# Patient Record
Sex: Female | Born: 1965 | Race: Black or African American | Hispanic: No | Smoking: Former smoker
Health system: Southern US, Community
[De-identification: ages and names within clinical notes are randomized; demographics above are authoritative.]

## PROBLEM LIST (undated history)

## (undated) DIAGNOSIS — D235 Other benign neoplasm of skin of trunk: Secondary | ICD-10-CM

## (undated) DIAGNOSIS — F32A Depression, unspecified: Secondary | ICD-10-CM

## (undated) DIAGNOSIS — I2699 Other pulmonary embolism without acute cor pulmonale: Secondary | ICD-10-CM

## (undated) DIAGNOSIS — M1990 Unspecified osteoarthritis, unspecified site: Secondary | ICD-10-CM

## (undated) DIAGNOSIS — F419 Anxiety disorder, unspecified: Secondary | ICD-10-CM

## (undated) DIAGNOSIS — M549 Dorsalgia, unspecified: Secondary | ICD-10-CM

## (undated) DIAGNOSIS — I82409 Acute embolism and thrombosis of unspecified deep veins of unspecified lower extremity: Secondary | ICD-10-CM

## (undated) DIAGNOSIS — I671 Cerebral aneurysm, nonruptured: Secondary | ICD-10-CM

## (undated) DIAGNOSIS — G8929 Other chronic pain: Secondary | ICD-10-CM

## (undated) DIAGNOSIS — D509 Iron deficiency anemia, unspecified: Secondary | ICD-10-CM

## (undated) DIAGNOSIS — I10 Essential (primary) hypertension: Secondary | ICD-10-CM

## (undated) DIAGNOSIS — M797 Fibromyalgia: Secondary | ICD-10-CM

## (undated) DIAGNOSIS — G43909 Migraine, unspecified, not intractable, without status migrainosus: Secondary | ICD-10-CM

## (undated) HISTORY — PX: ENDOMETRIAL ABLATION: SHX621

## (undated) HISTORY — DX: Iron deficiency anemia, unspecified: D50.9

## (undated) HISTORY — PX: HERNIA REPAIR: SHX51

## (undated) HISTORY — PX: COLONOSCOPY W/ POLYPECTOMY: SHX1380

---

## 2007-10-22 ENCOUNTER — Ambulatory Visit: Payer: Self-pay | Admitting: Family Medicine

## 2007-10-23 ENCOUNTER — Encounter: Admission: RE | Admit: 2007-10-23 | Discharge: 2007-10-23 | Payer: Self-pay | Admitting: Family Medicine

## 2007-11-05 ENCOUNTER — Other Ambulatory Visit: Admission: RE | Admit: 2007-11-05 | Discharge: 2007-11-05 | Payer: Self-pay | Admitting: Family Medicine

## 2007-11-05 ENCOUNTER — Ambulatory Visit: Payer: Self-pay | Admitting: Family Medicine

## 2007-11-21 ENCOUNTER — Ambulatory Visit: Payer: Self-pay | Admitting: Family Medicine

## 2007-12-06 ENCOUNTER — Ambulatory Visit: Payer: Self-pay | Admitting: Family Medicine

## 2008-03-21 ENCOUNTER — Emergency Department (HOSPITAL_COMMUNITY): Admission: EM | Admit: 2008-03-21 | Discharge: 2008-03-22 | Payer: Self-pay | Admitting: Emergency Medicine

## 2008-06-24 ENCOUNTER — Ambulatory Visit: Payer: Self-pay | Admitting: Family Medicine

## 2008-07-31 ENCOUNTER — Ambulatory Visit: Payer: Self-pay | Admitting: Family Medicine

## 2008-09-08 ENCOUNTER — Ambulatory Visit: Payer: Self-pay | Admitting: Family Medicine

## 2008-09-11 ENCOUNTER — Ambulatory Visit (HOSPITAL_COMMUNITY): Admission: RE | Admit: 2008-09-11 | Discharge: 2008-09-11 | Payer: Self-pay | Admitting: Family Medicine

## 2008-09-11 ENCOUNTER — Encounter: Payer: Self-pay | Admitting: Family Medicine

## 2008-09-11 ENCOUNTER — Ambulatory Visit: Payer: Self-pay | Admitting: Vascular Surgery

## 2008-10-01 ENCOUNTER — Ambulatory Visit: Payer: Self-pay | Admitting: Family Medicine

## 2008-10-08 ENCOUNTER — Encounter: Admission: RE | Admit: 2008-10-08 | Discharge: 2008-11-20 | Payer: Self-pay | Admitting: Orthopaedic Surgery

## 2008-10-31 ENCOUNTER — Ambulatory Visit: Payer: Self-pay | Admitting: Family Medicine

## 2008-12-05 ENCOUNTER — Ambulatory Visit: Payer: Self-pay | Admitting: Family Medicine

## 2008-12-31 ENCOUNTER — Emergency Department (HOSPITAL_COMMUNITY): Admission: EM | Admit: 2008-12-31 | Discharge: 2009-01-01 | Payer: Self-pay | Admitting: Emergency Medicine

## 2009-01-23 ENCOUNTER — Encounter (INDEPENDENT_AMBULATORY_CARE_PROVIDER_SITE_OTHER): Payer: Self-pay | Admitting: Internal Medicine

## 2009-01-23 ENCOUNTER — Inpatient Hospital Stay (HOSPITAL_COMMUNITY): Admission: EM | Admit: 2009-01-23 | Discharge: 2009-01-26 | Payer: Self-pay | Admitting: Emergency Medicine

## 2009-01-26 ENCOUNTER — Encounter (INDEPENDENT_AMBULATORY_CARE_PROVIDER_SITE_OTHER): Payer: Self-pay | Admitting: Internal Medicine

## 2009-01-28 ENCOUNTER — Ambulatory Visit: Payer: Self-pay | Admitting: Family Medicine

## 2009-01-31 ENCOUNTER — Emergency Department (HOSPITAL_COMMUNITY): Admission: EM | Admit: 2009-01-31 | Discharge: 2009-01-31 | Payer: Self-pay | Admitting: Emergency Medicine

## 2009-02-03 ENCOUNTER — Emergency Department (HOSPITAL_COMMUNITY): Admission: EM | Admit: 2009-02-03 | Discharge: 2009-02-03 | Payer: Self-pay | Admitting: Emergency Medicine

## 2009-02-04 ENCOUNTER — Ambulatory Visit: Payer: Self-pay | Admitting: Family Medicine

## 2009-02-11 ENCOUNTER — Ambulatory Visit: Payer: Self-pay | Admitting: Family Medicine

## 2009-02-25 ENCOUNTER — Emergency Department (HOSPITAL_COMMUNITY): Admission: EM | Admit: 2009-02-25 | Discharge: 2009-02-26 | Payer: Self-pay | Admitting: Emergency Medicine

## 2009-02-26 ENCOUNTER — Encounter (INDEPENDENT_AMBULATORY_CARE_PROVIDER_SITE_OTHER): Payer: Self-pay | Admitting: Emergency Medicine

## 2009-02-26 ENCOUNTER — Ambulatory Visit: Admission: RE | Admit: 2009-02-26 | Discharge: 2009-02-26 | Payer: Self-pay | Admitting: Emergency Medicine

## 2009-02-26 ENCOUNTER — Ambulatory Visit: Payer: Self-pay | Admitting: Vascular Surgery

## 2009-04-20 ENCOUNTER — Emergency Department (HOSPITAL_COMMUNITY): Admission: EM | Admit: 2009-04-20 | Discharge: 2009-04-20 | Payer: Self-pay | Admitting: Emergency Medicine

## 2009-05-09 ENCOUNTER — Emergency Department (HOSPITAL_COMMUNITY): Admission: EM | Admit: 2009-05-09 | Discharge: 2009-05-10 | Payer: Self-pay | Admitting: Emergency Medicine

## 2009-05-11 ENCOUNTER — Ambulatory Visit: Payer: Self-pay | Admitting: Family Medicine

## 2009-06-15 ENCOUNTER — Ambulatory Visit: Payer: Self-pay | Admitting: Family Medicine

## 2009-06-17 ENCOUNTER — Encounter: Admission: RE | Admit: 2009-06-17 | Discharge: 2009-06-17 | Payer: Self-pay | Admitting: Family Medicine

## 2009-06-21 ENCOUNTER — Emergency Department (HOSPITAL_COMMUNITY): Admission: EM | Admit: 2009-06-21 | Discharge: 2009-06-21 | Payer: Self-pay | Admitting: Emergency Medicine

## 2009-06-22 ENCOUNTER — Ambulatory Visit: Payer: Self-pay | Admitting: Family Medicine

## 2009-07-07 ENCOUNTER — Ambulatory Visit: Payer: Self-pay | Admitting: Family Medicine

## 2009-07-14 ENCOUNTER — Ambulatory Visit: Payer: Self-pay | Admitting: Family Medicine

## 2009-10-13 ENCOUNTER — Ambulatory Visit: Payer: Self-pay | Admitting: Family Medicine

## 2009-10-19 ENCOUNTER — Ambulatory Visit: Payer: Self-pay | Admitting: Family Medicine

## 2009-10-19 ENCOUNTER — Ambulatory Visit: Payer: Self-pay | Admitting: Oncology

## 2009-10-20 ENCOUNTER — Inpatient Hospital Stay (HOSPITAL_COMMUNITY): Admission: RE | Admit: 2009-10-20 | Discharge: 2009-10-28 | Payer: Self-pay | Admitting: Cardiovascular Disease

## 2009-10-28 ENCOUNTER — Ambulatory Visit: Payer: Self-pay | Admitting: Oncology

## 2009-10-30 LAB — PROTIME-INR
INR: 2.8 (ref 2.00–3.50)
Protime: 33.6 Seconds — ABNORMAL HIGH (ref 10.6–13.4)

## 2009-11-02 LAB — CBC WITH DIFFERENTIAL/PLATELET
BASO%: 0.3 % (ref 0.0–2.0)
Basophils Absolute: 0 10*3/uL (ref 0.0–0.1)
EOS%: 4.9 % (ref 0.0–7.0)
Eosinophils Absolute: 0.3 10*3/uL (ref 0.0–0.5)
HCT: 31.8 % — ABNORMAL LOW (ref 34.8–46.6)
HGB: 10.2 g/dL — ABNORMAL LOW (ref 11.6–15.9)
LYMPH%: 28.8 % (ref 14.0–49.7)
MCH: 25.2 pg (ref 25.1–34.0)
MCHC: 32 g/dL (ref 31.5–36.0)
MCV: 78.9 fL — ABNORMAL LOW (ref 79.5–101.0)
MONO#: 0.4 10*3/uL (ref 0.1–0.9)
MONO%: 6 % (ref 0.0–14.0)
NEUT#: 3.9 10*3/uL (ref 1.5–6.5)
NEUT%: 60 % (ref 38.4–76.8)
Platelets: 516 10*3/uL — ABNORMAL HIGH (ref 145–400)
RBC: 4.03 10*6/uL (ref 3.70–5.45)
RDW: 22.8 % — ABNORMAL HIGH (ref 11.2–14.5)
WBC: 6.5 10*3/uL (ref 3.9–10.3)
lymph#: 1.9 10*3/uL (ref 0.9–3.3)

## 2009-11-02 LAB — MORPHOLOGY: PLT EST: INCREASED

## 2009-11-02 LAB — PROTIME-INR
INR: 2.8 (ref 2.00–3.50)
Protime: 33.6 Seconds — ABNORMAL HIGH (ref 10.6–13.4)

## 2009-11-02 LAB — CHCC SMEAR

## 2009-11-03 LAB — COMPREHENSIVE METABOLIC PANEL
ALT: 12 U/L (ref 0–35)
AST: 12 U/L (ref 0–37)
Albumin: 4.1 g/dL (ref 3.5–5.2)
Alkaline Phosphatase: 73 U/L (ref 39–117)
BUN: 14 mg/dL (ref 6–23)
CO2: 19 mEq/L (ref 19–32)
Calcium: 9.2 mg/dL (ref 8.4–10.5)
Chloride: 110 mEq/L (ref 96–112)
Creatinine, Ser: 0.86 mg/dL (ref 0.40–1.20)
Glucose, Bld: 102 mg/dL — ABNORMAL HIGH (ref 70–99)
Potassium: 3.8 mEq/L (ref 3.5–5.3)
Sodium: 140 mEq/L (ref 135–145)
Total Bilirubin: 0.2 mg/dL — ABNORMAL LOW (ref 0.3–1.2)
Total Protein: 7.6 g/dL (ref 6.0–8.3)

## 2009-11-03 LAB — LACTATE DEHYDROGENASE: LDH: 183 U/L (ref 94–250)

## 2009-11-03 LAB — IRON AND TIBC
%SAT: 73 % — ABNORMAL HIGH (ref 20–55)
Iron: 269 ug/dL — ABNORMAL HIGH (ref 42–145)
TIBC: 367 ug/dL (ref 250–470)
UIBC: 98 ug/dL

## 2009-11-03 LAB — FERRITIN: Ferritin: 33 ng/mL (ref 10–291)

## 2009-11-03 LAB — TRANSFERRIN RECEPTOR, SOLUABLE: Transferrin Receptor, Soluble: 41.1 nmol/L

## 2009-11-04 LAB — PROTIME-INR
INR: 2.8 (ref 2.00–3.50)
Protime: 33.6 Seconds — ABNORMAL HIGH (ref 10.6–13.4)

## 2009-11-06 ENCOUNTER — Ambulatory Visit: Admission: RE | Admit: 2009-11-06 | Discharge: 2009-11-06 | Payer: Self-pay | Admitting: Oncology

## 2009-11-06 ENCOUNTER — Ambulatory Visit: Payer: Self-pay | Admitting: Vascular Surgery

## 2009-11-06 ENCOUNTER — Encounter (HOSPITAL_COMMUNITY): Payer: Self-pay | Admitting: Oncology

## 2009-11-06 LAB — PROTIME-INR
INR: 1.3 — ABNORMAL LOW (ref 2.00–3.50)
Protime: 15.6 Seconds — ABNORMAL HIGH (ref 10.6–13.4)

## 2009-11-09 LAB — PROTIME-INR
INR: 1.3 — ABNORMAL LOW (ref 2.00–3.50)
Protime: 15.6 Seconds — ABNORMAL HIGH (ref 10.6–13.4)

## 2009-11-11 LAB — PROTIME-INR
INR: 1.3 — ABNORMAL LOW (ref 2.00–3.50)
Protime: 15.6 Seconds — ABNORMAL HIGH (ref 10.6–13.4)

## 2009-11-13 LAB — PROTIME-INR
INR: 1.7 — ABNORMAL LOW (ref 2.00–3.50)
Protime: 20.4 Seconds — ABNORMAL HIGH (ref 10.6–13.4)

## 2009-11-17 LAB — PROTIME-INR
INR: 3.3 (ref 2.00–3.50)
Protime: 39.6 Seconds — ABNORMAL HIGH (ref 10.6–13.4)

## 2009-11-20 LAB — PROTIME-INR
INR: 2.6 (ref 2.00–3.50)
Protime: 31.2 Seconds — ABNORMAL HIGH (ref 10.6–13.4)

## 2009-11-24 ENCOUNTER — Inpatient Hospital Stay (HOSPITAL_COMMUNITY): Admission: AD | Admit: 2009-11-24 | Discharge: 2009-11-24 | Payer: Self-pay | Admitting: Obstetrics and Gynecology

## 2009-11-24 ENCOUNTER — Ambulatory Visit: Payer: Self-pay | Admitting: Nurse Practitioner

## 2009-11-27 ENCOUNTER — Ambulatory Visit: Payer: Self-pay | Admitting: Oncology

## 2009-11-27 LAB — PROTIME-INR
INR: 3.3 (ref 2.00–3.50)
Protime: 39.6 Seconds — ABNORMAL HIGH (ref 10.6–13.4)

## 2009-12-08 LAB — PROTIME-INR
INR: 4.2 — ABNORMAL HIGH (ref 2.00–3.50)
Protime: 50.4 Seconds — ABNORMAL HIGH (ref 10.6–13.4)

## 2009-12-10 LAB — PROTIME-INR
INR: 2.9 (ref 2.00–3.50)
Protime: 34.8 Seconds — ABNORMAL HIGH (ref 10.6–13.4)

## 2009-12-14 LAB — PROTIME-INR
INR: 4.6 — ABNORMAL HIGH (ref 2.00–3.50)
Protime: 55.2 Seconds — ABNORMAL HIGH (ref 10.6–13.4)

## 2009-12-16 LAB — CBC WITH DIFFERENTIAL/PLATELET
BASO%: 0.5 % (ref 0.0–2.0)
Basophils Absolute: 0 10*3/uL (ref 0.0–0.1)
EOS%: 1 % (ref 0.0–7.0)
Eosinophils Absolute: 0.1 10*3/uL (ref 0.0–0.5)
HCT: 33.4 % — ABNORMAL LOW (ref 34.8–46.6)
HGB: 11 g/dL — ABNORMAL LOW (ref 11.6–15.9)
LYMPH%: 19.7 % (ref 14.0–49.7)
MCH: 26.4 pg (ref 25.1–34.0)
MCHC: 33 g/dL (ref 31.5–36.0)
MCV: 80 fL (ref 79.5–101.0)
MONO#: 0.2 10*3/uL (ref 0.1–0.9)
MONO%: 2.7 % (ref 0.0–14.0)
NEUT#: 5.9 10*3/uL (ref 1.5–6.5)
NEUT%: 76.1 % (ref 38.4–76.8)
Platelets: 444 10*3/uL — ABNORMAL HIGH (ref 145–400)
RBC: 4.18 10*6/uL (ref 3.70–5.45)
RDW: 19.7 % — ABNORMAL HIGH (ref 11.2–14.5)
WBC: 7.8 10*3/uL (ref 3.9–10.3)
lymph#: 1.5 10*3/uL (ref 0.9–3.3)

## 2009-12-16 LAB — COMPREHENSIVE METABOLIC PANEL
ALT: 8 U/L (ref 0–35)
AST: 12 U/L (ref 0–37)
Albumin: 4.3 g/dL (ref 3.5–5.2)
Alkaline Phosphatase: 77 U/L (ref 39–117)
BUN: 15 mg/dL (ref 6–23)
CO2: 18 mEq/L — ABNORMAL LOW (ref 19–32)
Calcium: 9 mg/dL (ref 8.4–10.5)
Chloride: 109 mEq/L (ref 96–112)
Creatinine, Ser: 0.8 mg/dL (ref 0.40–1.20)
Glucose, Bld: 148 mg/dL — ABNORMAL HIGH (ref 70–99)
Potassium: 3.7 mEq/L (ref 3.5–5.3)
Sodium: 139 mEq/L (ref 135–145)
Total Bilirubin: 0.3 mg/dL (ref 0.3–1.2)
Total Protein: 7.7 g/dL (ref 6.0–8.3)

## 2009-12-16 LAB — IRON AND TIBC
%SAT: 9 % — ABNORMAL LOW (ref 20–55)
Iron: 34 ug/dL — ABNORMAL LOW (ref 42–145)
TIBC: 391 ug/dL (ref 250–470)
UIBC: 357 ug/dL

## 2009-12-16 LAB — FERRITIN: Ferritin: 8 ng/mL — ABNORMAL LOW (ref 10–291)

## 2009-12-16 LAB — PROTIME-INR
INR: 3.5 (ref 2.00–3.50)
Protime: 42 Seconds — ABNORMAL HIGH (ref 10.6–13.4)

## 2009-12-16 LAB — LACTATE DEHYDROGENASE: LDH: 153 U/L (ref 94–250)

## 2009-12-25 LAB — PROTIME-INR

## 2009-12-25 LAB — PROTHROMBIN TIME
INR: 8.67 (ref <1.50)
Prothrombin Time: 70.8 seconds — ABNORMAL HIGH (ref 11.6–15.2)

## 2009-12-28 ENCOUNTER — Ambulatory Visit: Payer: Self-pay | Admitting: Oncology

## 2009-12-28 LAB — PROTIME-INR
INR: 4.7 — ABNORMAL HIGH (ref 2.00–3.50)
Protime: 56.4 Seconds — ABNORMAL HIGH (ref 10.6–13.4)

## 2010-01-01 LAB — PROTIME-INR
INR: 1 — ABNORMAL LOW (ref 2.00–3.50)
Protime: 12 Seconds (ref 10.6–13.4)

## 2010-01-04 LAB — PROTIME-INR
INR: 1.1 — ABNORMAL LOW (ref 2.00–3.50)
Protime: 13.2 Seconds (ref 10.6–13.4)

## 2010-01-07 LAB — PROTIME-INR
INR: 1.6 — ABNORMAL LOW (ref 2.00–3.50)
Protime: 19.2 Seconds — ABNORMAL HIGH (ref 10.6–13.4)

## 2010-01-14 LAB — PROTIME-INR
INR: 2.9 (ref 2.00–3.50)
Protime: 34.8 Seconds — ABNORMAL HIGH (ref 10.6–13.4)

## 2010-01-19 LAB — PROTIME-INR
INR: 1.3 — ABNORMAL LOW (ref 2.00–3.50)
Protime: 15.6 Seconds — ABNORMAL HIGH (ref 10.6–13.4)

## 2010-01-21 LAB — PROTIME-INR
INR: 1.9 — ABNORMAL LOW (ref 2.00–3.50)
Protime: 22.8 Seconds — ABNORMAL HIGH (ref 10.6–13.4)

## 2010-01-27 LAB — CHROMOGENIC FACTOR X: CHROM XA: 40.4 % — ABNORMAL LOW (ref 86–146)

## 2010-02-04 ENCOUNTER — Emergency Department (HOSPITAL_COMMUNITY): Admission: EM | Admit: 2010-02-04 | Discharge: 2010-02-05 | Payer: Self-pay | Admitting: Emergency Medicine

## 2010-03-01 ENCOUNTER — Ambulatory Visit: Payer: Self-pay | Admitting: Oncology

## 2010-03-09 LAB — COMPREHENSIVE METABOLIC PANEL
ALT: <8 U/L (ref 0–35)
AST: 9 U/L (ref 0–37)
Albumin: 4.5 g/dL (ref 3.5–5.2)
Alkaline Phosphatase: 64 U/L (ref 39–117)
BUN: 7 mg/dL (ref 6–23)
CO2: 25 mEq/L (ref 19–32)
Calcium: 9.5 mg/dL (ref 8.4–10.5)
Chloride: 104 mEq/L (ref 96–112)
Creatinine, Ser: 0.7 mg/dL (ref 0.40–1.20)
Glucose, Bld: 97 mg/dL (ref 70–99)
Potassium: 3.8 mEq/L (ref 3.5–5.3)
Sodium: 139 mEq/L (ref 135–145)
Total Bilirubin: 0.5 mg/dL (ref 0.3–1.2)
Total Protein: 7.9 g/dL (ref 6.0–8.3)

## 2010-03-09 LAB — IRON AND TIBC
%SAT: 18 % — ABNORMAL LOW (ref 20–55)
Iron: 70 ug/dL (ref 42–145)
TIBC: 388 ug/dL (ref 250–470)
UIBC: 318 ug/dL

## 2010-03-09 LAB — CBC WITH DIFFERENTIAL/PLATELET
BASO%: 1 % (ref 0.0–2.0)
Basophils Absolute: 0.1 10*3/uL (ref 0.0–0.1)
EOS%: 0.6 % (ref 0.0–7.0)
Eosinophils Absolute: 0 10*3/uL (ref 0.0–0.5)
HCT: 36.2 % (ref 34.8–46.6)
HGB: 11.7 g/dL (ref 11.6–15.9)
LYMPH%: 31.1 % (ref 14.0–49.7)
MCH: 26.1 pg (ref 25.1–34.0)
MCHC: 32.4 g/dL (ref 31.5–36.0)
MCV: 80.4 fL (ref 79.5–101.0)
MONO#: 0.2 10*3/uL (ref 0.1–0.9)
MONO%: 3.9 % (ref 0.0–14.0)
NEUT#: 3.4 10*3/uL (ref 1.5–6.5)
NEUT%: 63.4 % (ref 38.4–76.8)
Platelets: 385 10*3/uL (ref 145–400)
RBC: 4.5 10*6/uL (ref 3.70–5.45)
RDW: 18.1 % — ABNORMAL HIGH (ref 11.2–14.5)
WBC: 5.4 10*3/uL (ref 3.9–10.3)
lymph#: 1.7 10*3/uL (ref 0.9–3.3)

## 2010-03-09 LAB — FERRITIN: Ferritin: 13 ng/mL (ref 10–291)

## 2010-03-09 LAB — LACTATE DEHYDROGENASE: LDH: 120 U/L (ref 94–250)

## 2010-03-15 LAB — CHROMOGENIC FACTOR X: CHROM XA: 99.7 % (ref 86–146)

## 2010-04-16 ENCOUNTER — Ambulatory Visit: Payer: Self-pay | Admitting: Oncology

## 2010-04-17 LAB — FERRITIN: Ferritin: 34 ng/mL (ref 10–291)

## 2010-05-10 LAB — CBC WITH DIFFERENTIAL/PLATELET
BASO%: 0.4 % (ref 0.0–2.0)
Basophils Absolute: 0 10*3/uL (ref 0.0–0.1)
EOS%: 6 % (ref 0.0–7.0)
Eosinophils Absolute: 0.3 10*3/uL (ref 0.0–0.5)
HCT: 35 % (ref 34.8–46.6)
HGB: 11.3 g/dL — ABNORMAL LOW (ref 11.6–15.9)
LYMPH%: 41.2 % (ref 14.0–49.7)
MCH: 26.6 pg (ref 25.1–34.0)
MCHC: 32.3 g/dL (ref 31.5–36.0)
MCV: 82.4 fL (ref 79.5–101.0)
MONO#: 0.4 10*3/uL (ref 0.1–0.9)
MONO%: 8.4 % (ref 0.0–14.0)
NEUT#: 2.2 10*3/uL (ref 1.5–6.5)
NEUT%: 44 % (ref 38.4–76.8)
Platelets: 267 10*3/uL (ref 145–400)
RBC: 4.25 10*6/uL (ref 3.70–5.45)
RDW: 18.4 % — ABNORMAL HIGH (ref 11.2–14.5)
WBC: 5 10*3/uL (ref 3.9–10.3)
lymph#: 2.1 10*3/uL (ref 0.9–3.3)
nRBC: 0 % (ref 0–0)

## 2010-05-10 LAB — HEPARIN ANTI-XA: Heparin LMW: 0.1 IU/mL

## 2010-05-11 LAB — COMPREHENSIVE METABOLIC PANEL
ALT: 8 U/L (ref 0–35)
AST: 12 U/L (ref 0–37)
Albumin: 3.8 g/dL (ref 3.5–5.2)
Alkaline Phosphatase: 71 U/L (ref 39–117)
BUN: 9 mg/dL (ref 6–23)
CO2: 26 mEq/L (ref 19–32)
Calcium: 8.9 mg/dL (ref 8.4–10.5)
Chloride: 108 mEq/L (ref 96–112)
Creatinine, Ser: 0.69 mg/dL (ref 0.40–1.20)
Glucose, Bld: 103 mg/dL — ABNORMAL HIGH (ref 70–99)
Potassium: 3.8 mEq/L (ref 3.5–5.3)
Sodium: 141 mEq/L (ref 135–145)
Total Bilirubin: 0.3 mg/dL (ref 0.3–1.2)
Total Protein: 6.6 g/dL (ref 6.0–8.3)

## 2010-05-11 LAB — D-DIMER, QUANTITATIVE: D-Dimer, Quant: 0.22 ug/mL-FEU (ref 0.00–0.48)

## 2010-05-11 LAB — IRON AND TIBC
%SAT: 27 % (ref 20–55)
Iron: 71 ug/dL (ref 42–145)
TIBC: 267 ug/dL (ref 250–470)
UIBC: 196 ug/dL

## 2010-05-11 LAB — FERRITIN: Ferritin: 89 ng/mL (ref 10–291)

## 2010-05-11 LAB — TRANSFERRIN RECEPTOR, SOLUABLE: Transferrin Receptor, Soluble: 13.6 nmol/L

## 2010-05-11 LAB — LACTATE DEHYDROGENASE: LDH: 124 U/L (ref 94–250)

## 2010-06-13 ENCOUNTER — Emergency Department (HOSPITAL_COMMUNITY)
Admission: EM | Admit: 2010-06-13 | Discharge: 2010-06-13 | Payer: Self-pay | Source: Home / Self Care | Admitting: Emergency Medicine

## 2010-07-08 ENCOUNTER — Emergency Department (HOSPITAL_COMMUNITY)
Admission: EM | Admit: 2010-07-08 | Discharge: 2010-07-08 | Payer: Self-pay | Source: Home / Self Care | Admitting: Emergency Medicine

## 2010-07-09 ENCOUNTER — Ambulatory Visit: Payer: Self-pay | Admitting: Oncology

## 2010-07-09 ENCOUNTER — Ambulatory Visit
Admission: RE | Admit: 2010-07-09 | Discharge: 2010-07-09 | Payer: Self-pay | Source: Home / Self Care | Attending: Oncology | Admitting: Oncology

## 2010-07-11 ENCOUNTER — Encounter: Payer: Self-pay | Admitting: Family Medicine

## 2010-07-12 LAB — CBC
HCT: 36.8 % (ref 36.0–46.0)
Hemoglobin: 11.6 g/dL — ABNORMAL LOW (ref 12.0–15.0)
MCH: 26.7 pg (ref 26.0–34.0)
MCHC: 31.5 g/dL (ref 30.0–36.0)
MCV: 84.6 fL (ref 78.0–100.0)
Platelets: 288 10*3/uL (ref 150–400)
RBC: 4.35 MIL/uL (ref 3.87–5.11)
RDW: 14.6 % (ref 11.5–15.5)
WBC: 8.1 10*3/uL (ref 4.0–10.5)

## 2010-07-12 LAB — DIFFERENTIAL
Basophils Absolute: 0 10*3/uL (ref 0.0–0.1)
Basophils Relative: 0 % (ref 0–1)
Eosinophils Absolute: 0.3 10*3/uL (ref 0.0–0.7)
Eosinophils Relative: 4 % (ref 0–5)
Lymphocytes Relative: 38 % (ref 12–46)
Lymphs Abs: 3.1 10*3/uL (ref 0.7–4.0)
Monocytes Absolute: 0.5 10*3/uL (ref 0.1–1.0)
Monocytes Relative: 6 % (ref 3–12)
Neutro Abs: 4.2 10*3/uL (ref 1.7–7.7)
Neutrophils Relative %: 52 % (ref 43–77)

## 2010-07-12 LAB — BASIC METABOLIC PANEL
BUN: 4 mg/dL — ABNORMAL LOW (ref 6–23)
CO2: 27 mEq/L (ref 19–32)
Calcium: 8.9 mg/dL (ref 8.4–10.5)
Chloride: 105 mEq/L (ref 96–112)
Creatinine, Ser: 0.69 mg/dL (ref 0.4–1.2)
GFR calc Af Amer: >60 mL/min (ref >60)
GFR calc non Af Amer: >60 mL/min (ref >60)
Glucose, Bld: 98 mg/dL (ref 70–99)
Potassium: 3.5 mEq/L (ref 3.5–5.1)
Sodium: 140 mEq/L (ref 135–145)

## 2010-07-13 LAB — CBC WITH DIFFERENTIAL/PLATELET
BASO%: 0.3 % (ref 0.0–2.0)
Basophils Absolute: 0 10*3/uL (ref 0.0–0.1)
EOS%: 3.6 % (ref 0.0–7.0)
Eosinophils Absolute: 0.3 10*3/uL (ref 0.0–0.5)
HCT: 40.2 % (ref 34.8–46.6)
HGB: 13.1 g/dL (ref 11.6–15.9)
LYMPH%: 25.6 % (ref 14.0–49.7)
MCH: 27.6 pg (ref 25.1–34.0)
MCHC: 32.6 g/dL (ref 31.5–36.0)
MCV: 84.6 fL (ref 79.5–101.0)
MONO#: 0.2 10*3/uL (ref 0.1–0.9)
MONO%: 2.1 % (ref 0.0–14.0)
NEUT#: 5.2 10*3/uL (ref 1.5–6.5)
NEUT%: 68.4 % (ref 38.4–76.8)
Platelets: 319 10*3/uL (ref 145–400)
RBC: 4.75 10*6/uL (ref 3.70–5.45)
RDW: 14.4 % (ref 11.2–14.5)
WBC: 7.6 10*3/uL (ref 3.9–10.3)
lymph#: 1.9 10*3/uL (ref 0.9–3.3)

## 2010-07-13 LAB — HEPARIN ANTI-XA: Heparin LMW: 0.39 IU/mL

## 2010-07-15 LAB — COMPREHENSIVE METABOLIC PANEL
ALT: 20 U/L (ref 0–35)
AST: 26 U/L (ref 0–37)
Albumin: 4.1 g/dL (ref 3.5–5.2)
Alkaline Phosphatase: 89 U/L (ref 39–117)
BUN: 7 mg/dL (ref 6–23)
CO2: 21 mEq/L (ref 19–32)
Calcium: 9.6 mg/dL (ref 8.4–10.5)
Chloride: 106 mEq/L (ref 96–112)
Creatinine, Ser: 0.81 mg/dL (ref 0.40–1.20)
Glucose, Bld: 95 mg/dL (ref 70–99)
Potassium: 4.3 mEq/L (ref 3.5–5.3)
Sodium: 139 mEq/L (ref 135–145)
Total Bilirubin: 0.3 mg/dL (ref 0.3–1.2)
Total Protein: 7.5 g/dL (ref 6.0–8.3)

## 2010-07-15 LAB — TRANSFERRIN RECEPTOR, SOLUABLE: Transferrin Receptor, Soluble: 12.5 nmol/L

## 2010-07-15 LAB — IRON AND TIBC
%SAT: 24 % (ref 20–55)
Iron: 67 ug/dL (ref 42–145)
TIBC: 284 ug/dL (ref 250–470)
UIBC: 217 ug/dL

## 2010-07-15 LAB — LACTATE DEHYDROGENASE: LDH: 211 U/L (ref 94–250)

## 2010-07-15 LAB — FERRITIN: Ferritin: 46 ng/mL (ref 10–291)

## 2010-07-16 ENCOUNTER — Encounter
Admission: RE | Admit: 2010-07-16 | Discharge: 2010-07-16 | Payer: Self-pay | Source: Home / Self Care | Attending: Oncology | Admitting: Oncology

## 2010-07-19 LAB — HEPARIN ANTI-XA: Heparin LMW: 0.86 IU/mL

## 2010-07-30 ENCOUNTER — Encounter (HOSPITAL_COMMUNITY)
Admission: RE | Admit: 2010-07-30 | Discharge: 2010-07-30 | Disposition: A | Discharge status: Home / Self Care | Payer: Medicaid Other | Source: Ambulatory Visit | Attending: Obstetrics and Gynecology | Admitting: Obstetrics and Gynecology

## 2010-07-30 DIAGNOSIS — Z01812 Encounter for preprocedural laboratory examination: Secondary | ICD-10-CM | POA: Insufficient documentation

## 2010-07-30 LAB — BASIC METABOLIC PANEL
BUN: 9 mg/dL (ref 6–23)
CO2: 26 mEq/L (ref 19–32)
Calcium: 9.2 mg/dL (ref 8.4–10.5)
Chloride: 104 mEq/L (ref 96–112)
Creatinine, Ser: 0.81 mg/dL (ref 0.4–1.2)
GFR calc Af Amer: >60 mL/min (ref >60)
GFR calc non Af Amer: >60 mL/min (ref >60)
Glucose, Bld: 92 mg/dL (ref 70–99)
Potassium: 3.2 mEq/L — ABNORMAL LOW (ref 3.5–5.1)
Sodium: 138 mEq/L (ref 135–145)

## 2010-07-30 LAB — CBC
HCT: 41.2 % (ref 36.0–46.0)
Hemoglobin: 13.4 g/dL (ref 12.0–15.0)
MCH: 27.5 pg (ref 26.0–34.0)
MCHC: 32.5 g/dL (ref 30.0–36.0)
MCV: 84.6 fL (ref 78.0–100.0)
Platelets: 329 10*3/uL (ref 150–400)
RBC: 4.87 MIL/uL (ref 3.87–5.11)
RDW: 14.1 % (ref 11.5–15.5)
WBC: 5.1 10*3/uL (ref 4.0–10.5)

## 2010-08-05 ENCOUNTER — Ambulatory Visit (HOSPITAL_COMMUNITY)
Admission: RE | Admit: 2010-08-05 | Discharge: 2010-08-05 | Disposition: A | Discharge status: Home / Self Care | Payer: Medicaid Other | Source: Ambulatory Visit | Attending: Obstetrics and Gynecology | Admitting: Obstetrics and Gynecology

## 2010-08-05 ENCOUNTER — Other Ambulatory Visit: Payer: Self-pay | Admitting: Obstetrics and Gynecology

## 2010-08-05 DIAGNOSIS — D649 Anemia, unspecified: Secondary | ICD-10-CM | POA: Insufficient documentation

## 2010-08-05 DIAGNOSIS — Z7901 Long term (current) use of anticoagulants: Secondary | ICD-10-CM | POA: Insufficient documentation

## 2010-08-05 DIAGNOSIS — N920 Excessive and frequent menstruation with regular cycle: Secondary | ICD-10-CM | POA: Insufficient documentation

## 2010-08-05 DIAGNOSIS — Z30432 Encounter for removal of intrauterine contraceptive device: Secondary | ICD-10-CM | POA: Insufficient documentation

## 2010-08-09 LAB — TYPE AND SCREEN
ABO/RH(D): O POS
Antibody Screen: POSITIVE
DAT, IgG: NEGATIVE

## 2010-08-09 NOTE — Op Note (Signed)
Maria [name], [name]               ACCOUNT NO.:  [id number]  MEDICAL RECORD NO.:  [id number]           PATIENT TYPE:  O  LOCATION:  [facility]                          [facility]  PHYSICIAN:  Tonatiuh Mallon H. Tenny Craw, MD     DATE OF BIRTH:  09/26/1965  DATE OF PROCEDURE:  08/05/2010 DATE OF DISCHARGE:  08/05/2010                              OPERATIVE REPORT   PREOPERATIVE DIAGNOSES: 1. Anemia. 2. Menorrhagia. 3. Chronic anticoagulation.  POSTOPERATIVE DIAGNOSES: 1. Anemia. 2. Menorrhagia. 3. Chronic anticoagulation.  PROCEDURES:  Mirena intrauterine device removal, hysteroscopy, dilation and curettage and endometrial ablation with NovaSure.  SURGEON:  Freddrick March. Tenny Craw, MD  ASSISTANT:  None.  ANESTHESIA:  General.  OPERATIVE FINDINGS:  Small uterus.  No palpable masses.  The uterus sounded to 9-cm, cervical length 4-1/2 cm, cavity length 4.5 cm, cavity width 3.5 cm, power 8.7, ablation time 1 minute 40 seconds.  HYSTEROSCOPIC FINDINGS:  Were atrophic endometrium with bilateral tubal ostia visualized.  SPECIMENS:  Endometrial curettings.  DISPOSITION OF SPECIMENS:  To Pathology.  ESTIMATED BLOOD LOSS:  Minimal.  COMPLICATIONS:  None.  PROCEDURE:  Maria [name] is a 45 year old, G5, P5, African American female with a history of anemia and menorrhagia.  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  With initiation of anticoagulation, her menorrhagia worsened.  She had a trial of Mirena intrauterine device which did decrease the volume of bleeding.  However, she still had persistent bleeding.  We then tried a trial of Depo-Lupron.  She continued to have spotting.  Given these findings and the desire to avoid hysterectomy if possible due to the fact that the patient is a poor surgical candidate for hysterectomy due to her history of prior small bowel resection at the time of her second C-section due to adhesions and bowel perforation.  The  decision was made to proceed with an endometrial ablation to try to minimize bleeding. Risks, benefits and alternatives of the procedure were discussed with the patient at length. Consultation with the patient's hematologist, Dr. Arline Asp were undertaken to coordinate anticoagulation.  The patient was then brought to the operating room, where she was placed in the dorsal lithotomy position and general anesthesia was administered and found to be adequate.  The vagina and perineum were prepped and draped in the normal sterile fashion.  Speculum was placed into the vagina.  Single- toothed tenaculum was placed on the anterior lip of the cervix.  The Mirena intrauterine device was removed with ring forceps.  The cervix was serially dilated with Hanks dilators.  The hysteroscope was then introduced transcervically with good visualization of the endometrial cavity.  No significant polyps or fibroids were noted.  Tubal ostia were visualized bilaterally.  The endometrium itself appeared atrophic. Hysteroscope was removed and a sharp curettage was performed for minimal tissue.  The uterine cavity was sounded for a length of 9 cm.  The cervical length was then obtained and found to be 4-1/2 cm.  The NovaSure device was then deployed easily.  Cavity width was performed for 3.5 cm.  The cavity assessment was performed and  passed.  The ablative procedure was then initiated for 1 minute and 40 seconds of ablation at a power of 8.7.  After the ablate of procedure was completed, the NovaSure device was removed from the uterus.  The hysteroscope was reintroduced transcervically and demonstrated successful ablation of all visualized endometrium.  The hysteroscope was removed from the uterus.  The single-toothed tenaculum was removed from the anterior lip of the cervix.  Minimal bleeding was noted at the end of the procedure.  The speculum was then removed.  This completed the procedure.  The patient was  taken out of lithotomy position and the anesthesia was reversed and the patient was brought transferred to the PACU in stable condition.     Freddrick March. Tenny Craw, MD     KHR/MEDQ  D:  08/05/2010  T:  08/06/2010  Job:  [id number]  cc:   Samul Dada, M.D. Fax: [phone]  Electronically Signed by Waynard Reeds MD on 08/09/2010 09:29:06 AM

## 2010-08-16 ENCOUNTER — Other Ambulatory Visit (HOSPITAL_COMMUNITY): Payer: Self-pay | Admitting: Oncology

## 2010-08-16 ENCOUNTER — Encounter (HOSPITAL_BASED_OUTPATIENT_CLINIC_OR_DEPARTMENT_OTHER): Payer: Medicaid Other | Admitting: Oncology

## 2010-08-16 DIAGNOSIS — N898 Other specified noninflammatory disorders of vagina: Secondary | ICD-10-CM

## 2010-08-16 DIAGNOSIS — D509 Iron deficiency anemia, unspecified: Secondary | ICD-10-CM

## 2010-08-16 DIAGNOSIS — I2699 Other pulmonary embolism without acute cor pulmonale: Secondary | ICD-10-CM

## 2010-08-16 DIAGNOSIS — Z86718 Personal history of other venous thrombosis and embolism: Secondary | ICD-10-CM

## 2010-08-16 LAB — CBC WITH DIFFERENTIAL/PLATELET
BASO%: 0.5 % (ref 0.0–2.0)
Basophils Absolute: 0 10*3/uL (ref 0.0–0.1)
EOS%: 3.2 % (ref 0.0–7.0)
Eosinophils Absolute: 0.2 10*3/uL (ref 0.0–0.5)
HCT: 38 % (ref 34.8–46.6)
HGB: 12.5 g/dL (ref 11.6–15.9)
LYMPH%: 35.7 % (ref 14.0–49.7)
MCH: 27.4 pg (ref 25.1–34.0)
MCHC: 32.9 g/dL (ref 31.5–36.0)
MCV: 83.3 fL (ref 79.5–101.0)
MONO#: 0.2 10*3/uL (ref 0.1–0.9)
MONO%: 4 % (ref 0.0–14.0)
NEUT#: 3.2 10*3/uL (ref 1.5–6.5)
NEUT%: 56.6 % (ref 38.4–76.8)
Platelets: 283 10*3/uL (ref 145–400)
RBC: 4.56 10*6/uL (ref 3.70–5.45)
RDW: 14.6 % — ABNORMAL HIGH (ref 11.2–14.5)
WBC: 5.7 10*3/uL (ref 3.9–10.3)
lymph#: 2 10*3/uL (ref 0.9–3.3)
nRBC: 0 % (ref 0–0)

## 2010-08-16 LAB — HEPARIN ANTI-XA: Heparin LMW: 1.17 IU/mL

## 2010-08-18 ENCOUNTER — Ambulatory Visit (HOSPITAL_COMMUNITY)
Admission: RE | Admit: 2010-08-18 | Discharge: 2010-08-18 | Disposition: A | Discharge status: Home / Self Care | Payer: Medicaid Other | Source: Ambulatory Visit | Attending: Oncology | Admitting: Oncology

## 2010-08-18 DIAGNOSIS — M79609 Pain in unspecified limb: Secondary | ICD-10-CM

## 2010-08-18 DIAGNOSIS — I2782 Chronic pulmonary embolism: Secondary | ICD-10-CM | POA: Insufficient documentation

## 2010-08-18 DIAGNOSIS — Z7901 Long term (current) use of anticoagulants: Secondary | ICD-10-CM | POA: Insufficient documentation

## 2010-08-23 ENCOUNTER — Other Ambulatory Visit: Payer: Self-pay | Admitting: Obstetrics and Gynecology

## 2010-09-03 LAB — URINALYSIS, ROUTINE W REFLEX MICROSCOPIC
Bilirubin Urine: NEGATIVE
Glucose, UA: NEGATIVE mg/dL
Ketones, ur: >80 mg/dL — AB
Nitrite: NEGATIVE
Protein, ur: 30 mg/dL — AB
Specific Gravity, Urine: >1.046 — ABNORMAL HIGH (ref 1.005–1.030)
Urobilinogen, UA: 0.2 mg/dL (ref 0.0–1.0)
pH: 6.5 (ref 5.0–8.0)

## 2010-09-03 LAB — CULTURE, BLOOD (ROUTINE X 2)
Culture: NO GROWTH
Culture: NO GROWTH

## 2010-09-03 LAB — POTASSIUM: Potassium: 3.6 mEq/L (ref 3.5–5.1)

## 2010-09-03 LAB — LIPASE, BLOOD: Lipase: 35 U/L (ref 11–59)

## 2010-09-03 LAB — PROCALCITONIN: Procalcitonin: <0.5 ng/mL

## 2010-09-03 LAB — PREGNANCY, URINE: Preg Test, Ur: NEGATIVE

## 2010-09-03 LAB — POCT I-STAT, CHEM 8
BUN: 10 mg/dL (ref 6–23)
Calcium, Ion: 1.01 mmol/L — ABNORMAL LOW (ref 1.12–1.32)
Chloride: 107 mEq/L (ref 96–112)
Creatinine, Ser: 0.8 mg/dL (ref 0.4–1.2)
Glucose, Bld: 104 mg/dL — ABNORMAL HIGH (ref 70–99)
HCT: 41 % (ref 36.0–46.0)
Hemoglobin: 13.9 g/dL (ref 12.0–15.0)
Potassium: 6.7 mEq/L (ref 3.5–5.1)
Sodium: 139 mEq/L (ref 135–145)
TCO2: 28 mmol/L (ref 0–100)

## 2010-09-03 LAB — DIFFERENTIAL
Basophils Absolute: 0 10*3/uL (ref 0.0–0.1)
Basophils Relative: 0 % (ref 0–1)
Eosinophils Absolute: 0 10*3/uL (ref 0.0–0.7)
Eosinophils Relative: 0 % (ref 0–5)
Lymphocytes Relative: 19 % (ref 12–46)
Lymphs Abs: 1.7 10*3/uL (ref 0.7–4.0)
Monocytes Absolute: 0.4 10*3/uL (ref 0.1–1.0)
Monocytes Relative: 4 % (ref 3–12)
Neutro Abs: 7.1 10*3/uL (ref 1.7–7.7)
Neutrophils Relative %: 77 % (ref 43–77)

## 2010-09-03 LAB — CBC
HCT: 37.5 % (ref 36.0–46.0)
Hemoglobin: 12.2 g/dL (ref 12.0–15.0)
MCH: 26.6 pg (ref 26.0–34.0)
MCHC: 32.5 g/dL (ref 30.0–36.0)
MCV: 81.8 fL (ref 78.0–100.0)
Platelets: 397 10*3/uL (ref 150–400)
RBC: 4.59 MIL/uL (ref 3.87–5.11)
RDW: 17.8 % — ABNORMAL HIGH (ref 11.5–15.5)
WBC: 9.2 10*3/uL (ref 4.0–10.5)

## 2010-09-03 LAB — URINE CULTURE
Colony Count: 75000
Culture  Setup Time: 201108190529

## 2010-09-03 LAB — HEPATIC FUNCTION PANEL
ALT: 10 U/L (ref 0–35)
AST: 16 U/L (ref 0–37)
Albumin: 4.2 g/dL (ref 3.5–5.2)
Alkaline Phosphatase: 57 U/L (ref 39–117)
Bilirubin, Direct: 0.1 mg/dL (ref 0.0–0.3)
Indirect Bilirubin: 0.7 mg/dL (ref 0.3–0.9)
Total Bilirubin: 0.8 mg/dL (ref 0.3–1.2)
Total Protein: 8 g/dL (ref 6.0–8.3)

## 2010-09-03 LAB — URINE MICROSCOPIC-ADD ON

## 2010-09-03 LAB — LACTIC ACID, PLASMA: Lactic Acid, Venous: 1.4 mmol/L (ref 0.5–2.2)

## 2010-09-05 LAB — POCT I-STAT, CHEM 8
BUN: 8 mg/dL (ref 6–23)
Calcium, Ion: 1.11 mmol/L — ABNORMAL LOW (ref 1.12–1.32)
Chloride: 111 mEq/L (ref 96–112)
Creatinine, Ser: 0.8 mg/dL (ref 0.4–1.2)
Glucose, Bld: 102 mg/dL — ABNORMAL HIGH (ref 70–99)
HCT: 32 % — ABNORMAL LOW (ref 36.0–46.0)
Hemoglobin: 10.9 g/dL — ABNORMAL LOW (ref 12.0–15.0)
Potassium: 3.7 mEq/L (ref 3.5–5.1)
Sodium: 138 mEq/L (ref 135–145)
TCO2: 17 mmol/L (ref 0–100)

## 2010-09-05 LAB — PROTIME-INR
INR: 4.88 — ABNORMAL HIGH (ref 0.00–1.49)
Prothrombin Time: 45.2 seconds — ABNORMAL HIGH (ref 11.6–15.2)

## 2010-09-06 LAB — CBC
HCT: 32.4 % — ABNORMAL LOW (ref 36.0–46.0)
Hemoglobin: 10.8 g/dL — ABNORMAL LOW (ref 12.0–15.0)
MCHC: 33.3 g/dL (ref 30.0–36.0)
MCV: 79.8 fL (ref 78.0–100.0)
Platelets: 285 10*3/uL (ref 150–400)
RBC: 4.05 MIL/uL (ref 3.87–5.11)
RDW: 21.1 % — ABNORMAL HIGH (ref 11.5–15.5)
WBC: 6.2 10*3/uL (ref 4.0–10.5)

## 2010-09-06 LAB — SAMPLE TO BLOOD BANK

## 2010-09-07 LAB — BASIC METABOLIC PANEL
BUN: 4 mg/dL — ABNORMAL LOW (ref 6–23)
BUN: 4 mg/dL — ABNORMAL LOW (ref 6–23)
BUN: 5 mg/dL — ABNORMAL LOW (ref 6–23)
BUN: 9 mg/dL (ref 6–23)
CO2: 20 mEq/L (ref 19–32)
CO2: 23 mEq/L (ref 19–32)
CO2: 23 mEq/L (ref 19–32)
CO2: 23 mEq/L (ref 19–32)
Calcium: 8.5 mg/dL (ref 8.4–10.5)
Calcium: 8.6 mg/dL (ref 8.4–10.5)
Calcium: 9.1 mg/dL (ref 8.4–10.5)
Calcium: 9.2 mg/dL (ref 8.4–10.5)
Chloride: 111 mEq/L (ref 96–112)
Chloride: 112 mEq/L (ref 96–112)
Chloride: 112 mEq/L (ref 96–112)
Chloride: 114 mEq/L — ABNORMAL HIGH (ref 96–112)
Creatinine, Ser: 0.76 mg/dL (ref 0.4–1.2)
Creatinine, Ser: 0.83 mg/dL (ref 0.4–1.2)
Creatinine, Ser: 0.85 mg/dL (ref 0.4–1.2)
Creatinine, Ser: 0.86 mg/dL (ref 0.4–1.2)
GFR calc Af Amer: >60 mL/min (ref >60)
GFR calc Af Amer: >60 mL/min (ref >60)
GFR calc Af Amer: >60 mL/min (ref >60)
GFR calc Af Amer: >60 mL/min (ref >60)
GFR calc non Af Amer: >60 mL/min (ref >60)
GFR calc non Af Amer: >60 mL/min (ref >60)
GFR calc non Af Amer: >60 mL/min (ref >60)
GFR calc non Af Amer: >60 mL/min (ref >60)
Glucose, Bld: 100 mg/dL — ABNORMAL HIGH (ref 70–99)
Glucose, Bld: 106 mg/dL — ABNORMAL HIGH (ref 70–99)
Glucose, Bld: 119 mg/dL — ABNORMAL HIGH (ref 70–99)
Glucose, Bld: 121 mg/dL — ABNORMAL HIGH (ref 70–99)
Potassium: 3.4 mEq/L — ABNORMAL LOW (ref 3.5–5.1)
Potassium: 3.7 mEq/L (ref 3.5–5.1)
Potassium: 3.8 mEq/L (ref 3.5–5.1)
Potassium: 4 mEq/L (ref 3.5–5.1)
Sodium: 139 mEq/L (ref 135–145)
Sodium: 140 mEq/L (ref 135–145)
Sodium: 141 mEq/L (ref 135–145)
Sodium: 141 mEq/L (ref 135–145)

## 2010-09-07 LAB — ANTIPHOSPHOLIPID SYNDROME EVAL, BLD
Anticardiolipin IgA: 1 APL U/mL — ABNORMAL LOW (ref <22)
Anticardiolipin IgG: 5 GPL U/mL — ABNORMAL LOW (ref <23)
Anticardiolipin IgM: 5 MPL U/mL — ABNORMAL LOW (ref <11)
DRVVT: 45 secs — ABNORMAL HIGH (ref 36.2–44.3)
Lupus Anticoagulant: NOT DETECTED
PTT Lupus Anticoagulant: 49.1 secs — ABNORMAL HIGH (ref 32.0–43.4)
PTTLA 4:1 Mix: 44.5 secs (ref 36.3–48.8)
PTTLA Confirmation: 6.5 secs (ref <8.0)
Phosphatydalserine, IgA: <20 U/mL (ref <20)
Phosphatydalserine, IgG: <10 U/mL (ref <10)
Phosphatydalserine, IgM: <25 U/mL (ref <25)
dRVVT Incubated 1:1 Mix: 41.8 secs (ref 36.2–44.3)

## 2010-09-07 LAB — CBC
HCT: 24.5 % — ABNORMAL LOW (ref 36.0–46.0)
HCT: 24.5 % — ABNORMAL LOW (ref 36.0–46.0)
HCT: 25.2 % — ABNORMAL LOW (ref 36.0–46.0)
HCT: 25.3 % — ABNORMAL LOW (ref 36.0–46.0)
HCT: 25.7 % — ABNORMAL LOW (ref 36.0–46.0)
HCT: 27.2 % — ABNORMAL LOW (ref 36.0–46.0)
HCT: 27.4 % — ABNORMAL LOW (ref 36.0–46.0)
HCT: 28.5 % — ABNORMAL LOW (ref 36.0–46.0)
HCT: 28.6 % — ABNORMAL LOW (ref 36.0–46.0)
Hemoglobin: 8 g/dL — ABNORMAL LOW (ref 12.0–15.0)
Hemoglobin: 8 g/dL — ABNORMAL LOW (ref 12.0–15.0)
Hemoglobin: 8.1 g/dL — ABNORMAL LOW (ref 12.0–15.0)
Hemoglobin: 8.3 g/dL — ABNORMAL LOW (ref 12.0–15.0)
Hemoglobin: 8.4 g/dL — ABNORMAL LOW (ref 12.0–15.0)
Hemoglobin: 8.9 g/dL — ABNORMAL LOW (ref 12.0–15.0)
Hemoglobin: 8.9 g/dL — ABNORMAL LOW (ref 12.0–15.0)
Hemoglobin: 9.3 g/dL — ABNORMAL LOW (ref 12.0–15.0)
Hemoglobin: 9.3 g/dL — ABNORMAL LOW (ref 12.0–15.0)
MCHC: 32.1 g/dL (ref 30.0–36.0)
MCHC: 32.5 g/dL (ref 30.0–36.0)
MCHC: 32.5 g/dL (ref 30.0–36.0)
MCHC: 32.5 g/dL (ref 30.0–36.0)
MCHC: 32.6 g/dL (ref 30.0–36.0)
MCHC: 32.7 g/dL (ref 30.0–36.0)
MCHC: 32.7 g/dL (ref 30.0–36.0)
MCHC: 32.7 g/dL (ref 30.0–36.0)
MCHC: 32.8 g/dL (ref 30.0–36.0)
MCV: 77.6 fL — ABNORMAL LOW (ref 78.0–100.0)
MCV: 77.6 fL — ABNORMAL LOW (ref 78.0–100.0)
MCV: 77.9 fL — ABNORMAL LOW (ref 78.0–100.0)
MCV: 78 fL (ref 78.0–100.0)
MCV: 78.1 fL (ref 78.0–100.0)
MCV: 78.3 fL (ref 78.0–100.0)
MCV: 78.4 fL (ref 78.0–100.0)
MCV: 78.8 fL (ref 78.0–100.0)
MCV: 78.8 fL (ref 78.0–100.0)
Platelets: 329 10*3/uL (ref 150–400)
Platelets: 341 10*3/uL (ref 150–400)
Platelets: 345 10*3/uL (ref 150–400)
Platelets: 356 10*3/uL (ref 150–400)
Platelets: 360 10*3/uL (ref 150–400)
Platelets: 362 10*3/uL (ref 150–400)
Platelets: 368 10*3/uL (ref 150–400)
Platelets: 401 10*3/uL — ABNORMAL HIGH (ref 150–400)
Platelets: 405 10*3/uL — ABNORMAL HIGH (ref 150–400)
RBC: 3.13 MIL/uL — ABNORMAL LOW (ref 3.87–5.11)
RBC: 3.14 MIL/uL — ABNORMAL LOW (ref 3.87–5.11)
RBC: 3.23 MIL/uL — ABNORMAL LOW (ref 3.87–5.11)
RBC: 3.24 MIL/uL — ABNORMAL LOW (ref 3.87–5.11)
RBC: 3.26 MIL/uL — ABNORMAL LOW (ref 3.87–5.11)
RBC: 3.45 MIL/uL — ABNORMAL LOW (ref 3.87–5.11)
RBC: 3.54 MIL/uL — ABNORMAL LOW (ref 3.87–5.11)
RBC: 3.66 MIL/uL — ABNORMAL LOW (ref 3.87–5.11)
RBC: 3.69 MIL/uL — ABNORMAL LOW (ref 3.87–5.11)
RDW: 20.8 % — ABNORMAL HIGH (ref 11.5–15.5)
RDW: 21.1 % — ABNORMAL HIGH (ref 11.5–15.5)
RDW: 21.4 % — ABNORMAL HIGH (ref 11.5–15.5)
RDW: 21.4 % — ABNORMAL HIGH (ref 11.5–15.5)
RDW: 21.4 % — ABNORMAL HIGH (ref 11.5–15.5)
RDW: 21.8 % — ABNORMAL HIGH (ref 11.5–15.5)
RDW: 21.8 % — ABNORMAL HIGH (ref 11.5–15.5)
RDW: 22.1 % — ABNORMAL HIGH (ref 11.5–15.5)
RDW: 22.2 % — ABNORMAL HIGH (ref 11.5–15.5)
WBC: 11.6 10*3/uL — ABNORMAL HIGH (ref 4.0–10.5)
WBC: 6.7 10*3/uL (ref 4.0–10.5)
WBC: 6.9 10*3/uL (ref 4.0–10.5)
WBC: 7.1 10*3/uL (ref 4.0–10.5)
WBC: 7.7 10*3/uL (ref 4.0–10.5)
WBC: 8.4 10*3/uL (ref 4.0–10.5)
WBC: 8.5 10*3/uL (ref 4.0–10.5)
WBC: 8.6 10*3/uL (ref 4.0–10.5)
WBC: 9.2 10*3/uL (ref 4.0–10.5)

## 2010-09-07 LAB — PROTIME-INR
INR: 0.97 (ref 0.00–1.49)
INR: 0.99 (ref 0.00–1.49)
INR: 1.14 (ref 0.00–1.49)
INR: 1.62 — ABNORMAL HIGH (ref 0.00–1.49)
INR: 1.84 — ABNORMAL HIGH (ref 0.00–1.49)
INR: 1.91 — ABNORMAL HIGH (ref 0.00–1.49)
INR: 1.93 — ABNORMAL HIGH (ref 0.00–1.49)
INR: 2.21 — ABNORMAL HIGH (ref 0.00–1.49)
INR: 2.46 — ABNORMAL HIGH (ref 0.00–1.49)
Prothrombin Time: 12.8 seconds (ref 11.6–15.2)
Prothrombin Time: 13 seconds (ref 11.6–15.2)
Prothrombin Time: 14.5 seconds (ref 11.6–15.2)
Prothrombin Time: 19.1 seconds — ABNORMAL HIGH (ref 11.6–15.2)
Prothrombin Time: 21.1 seconds — ABNORMAL HIGH (ref 11.6–15.2)
Prothrombin Time: 21.7 seconds — ABNORMAL HIGH (ref 11.6–15.2)
Prothrombin Time: 21.9 seconds — ABNORMAL HIGH (ref 11.6–15.2)
Prothrombin Time: 24.3 seconds — ABNORMAL HIGH (ref 11.6–15.2)
Prothrombin Time: 26.5 seconds — ABNORMAL HIGH (ref 11.6–15.2)

## 2010-09-07 LAB — URINALYSIS, MICROSCOPIC ONLY
Bilirubin Urine: NEGATIVE
Glucose, UA: NEGATIVE mg/dL
Ketones, ur: NEGATIVE mg/dL
Nitrite: NEGATIVE
Protein, ur: 30 mg/dL — AB
Specific Gravity, Urine: 1.016 (ref 1.005–1.030)
Urobilinogen, UA: 1 mg/dL (ref 0.0–1.0)
pH: 6 (ref 5.0–8.0)

## 2010-09-07 LAB — IRON AND TIBC
Iron: 52 ug/dL (ref 42–135)
Saturation Ratios: 14 % — ABNORMAL LOW (ref 20–55)
TIBC: 381 ug/dL (ref 250–470)
UIBC: 329 ug/dL

## 2010-09-07 LAB — CROSSMATCH
ABO/RH(D): O POS
ABO/RH(D): O POS
Antibody Screen: POSITIVE
Antibody Screen: POSITIVE
DAT, IgG: NEGATIVE

## 2010-09-07 LAB — PROTEIN C, TOTAL: Protein C, Total: 76 % (ref 70–140)

## 2010-09-07 LAB — CANCER ANTIGEN 19-9: CA 19-9: 3.7 U/mL — ABNORMAL LOW (ref <35.0)

## 2010-09-07 LAB — CA 125: CA 125: 11.1 U/mL (ref 0.0–30.2)

## 2010-09-07 LAB — DIFFERENTIAL
Basophils Absolute: 0 10*3/uL (ref 0.0–0.1)
Basophils Absolute: 0.1 10*3/uL (ref 0.0–0.1)
Basophils Absolute: 0.1 10*3/uL (ref 0.0–0.1)
Basophils Relative: 0 % (ref 0–1)
Basophils Relative: 1 % (ref 0–1)
Basophils Relative: 1 % (ref 0–1)
Eosinophils Absolute: 0.3 10*3/uL (ref 0.0–0.7)
Eosinophils Absolute: 0.3 10*3/uL (ref 0.0–0.7)
Eosinophils Absolute: 0.4 10*3/uL (ref 0.0–0.7)
Eosinophils Relative: 4 % (ref 0–5)
Eosinophils Relative: 4 % (ref 0–5)
Eosinophils Relative: 5 % (ref 0–5)
Lymphocytes Relative: 36 % (ref 12–46)
Lymphocytes Relative: 39 % (ref 12–46)
Lymphocytes Relative: 40 % (ref 12–46)
Lymphs Abs: 2.6 10*3/uL (ref 0.7–4.0)
Lymphs Abs: 2.7 10*3/uL (ref 0.7–4.0)
Lymphs Abs: 2.7 10*3/uL (ref 0.7–4.0)
Monocytes Absolute: 0.3 10*3/uL (ref 0.1–1.0)
Monocytes Absolute: 0.5 10*3/uL (ref 0.1–1.0)
Monocytes Absolute: 0.6 10*3/uL (ref 0.1–1.0)
Monocytes Relative: 4 % (ref 3–12)
Monocytes Relative: 7 % (ref 3–12)
Monocytes Relative: 9 % (ref 3–12)
Neutro Abs: 3.2 10*3/uL (ref 1.7–7.7)
Neutro Abs: 3.4 10*3/uL (ref 1.7–7.7)
Neutro Abs: 3.5 10*3/uL (ref 1.7–7.7)
Neutrophils Relative %: 47 % (ref 43–77)
Neutrophils Relative %: 51 % (ref 43–77)
Neutrophils Relative %: 52 % (ref 43–77)

## 2010-09-07 LAB — HEPARIN LEVEL (UNFRACTIONATED)
Heparin Unfractionated: 0.22 IU/mL — ABNORMAL LOW (ref 0.30–0.70)
Heparin Unfractionated: 0.31 IU/mL (ref 0.30–0.70)
Heparin Unfractionated: 0.31 IU/mL (ref 0.30–0.70)
Heparin Unfractionated: 0.31 IU/mL (ref 0.30–0.70)
Heparin Unfractionated: 0.4 IU/mL (ref 0.30–0.70)
Heparin Unfractionated: 0.41 IU/mL (ref 0.30–0.70)
Heparin Unfractionated: 0.5 IU/mL (ref 0.30–0.70)
Heparin Unfractionated: 0.63 IU/mL (ref 0.30–0.70)
Heparin Unfractionated: 0.74 IU/mL — ABNORMAL HIGH (ref 0.30–0.70)
Heparin Unfractionated: 1 IU/mL — ABNORMAL HIGH (ref 0.30–0.70)
Heparin Unfractionated: <0.1 IU/mL — ABNORMAL LOW (ref 0.30–0.70)

## 2010-09-07 LAB — COMPREHENSIVE METABOLIC PANEL
ALT: 12 U/L (ref 0–35)
AST: 16 U/L (ref 0–37)
Albumin: 3.4 g/dL — ABNORMAL LOW (ref 3.5–5.2)
Alkaline Phosphatase: 72 U/L (ref 39–117)
BUN: 7 mg/dL (ref 6–23)
CO2: 22 mEq/L (ref 19–32)
Calcium: 8.6 mg/dL (ref 8.4–10.5)
Chloride: 111 mEq/L (ref 96–112)
Creatinine, Ser: 0.71 mg/dL (ref 0.4–1.2)
GFR calc Af Amer: >60 mL/min (ref >60)
GFR calc non Af Amer: >60 mL/min (ref >60)
Glucose, Bld: 95 mg/dL (ref 70–99)
Potassium: 3.9 mEq/L (ref 3.5–5.1)
Sodium: 138 mEq/L (ref 135–145)
Total Bilirubin: 0.3 mg/dL (ref 0.3–1.2)
Total Protein: 6.5 g/dL (ref 6.0–8.3)

## 2010-09-07 LAB — FERRITIN: Ferritin: 8 ng/mL — ABNORMAL LOW (ref 10–291)

## 2010-09-07 LAB — URINE CULTURE: Colony Count: >=100000

## 2010-09-07 LAB — TSH: TSH: 0.38 u[IU]/mL (ref 0.350–4.500)

## 2010-09-07 LAB — RETICULOCYTES
RBC.: 3.41 MIL/uL — ABNORMAL LOW (ref 3.87–5.11)
Retic Count, Absolute: 71.6 10*3/uL (ref 19.0–186.0)
Retic Ct Pct: 2.1 % (ref 0.4–3.1)

## 2010-09-07 LAB — CEA: CEA: <0.5 ng/mL (ref 0.0–5.0)

## 2010-09-07 LAB — ANA: Anti Nuclear Antibody(ANA): NEGATIVE

## 2010-09-07 LAB — ANTITHROMBIN III: AntiThromb III Func: 101 % (ref 75–120)

## 2010-09-07 LAB — APTT: aPTT: 29 seconds (ref 24–37)

## 2010-09-07 LAB — PROTEIN S, TOTAL: Protein S Ag, Total: 86 % (ref 70–140)

## 2010-09-07 LAB — FACTOR 5 LEIDEN

## 2010-09-14 ENCOUNTER — Other Ambulatory Visit (HOSPITAL_COMMUNITY): Payer: Self-pay | Admitting: Oncology

## 2010-09-14 ENCOUNTER — Encounter (HOSPITAL_BASED_OUTPATIENT_CLINIC_OR_DEPARTMENT_OTHER): Payer: Medicaid Other | Admitting: Oncology

## 2010-09-14 DIAGNOSIS — D509 Iron deficiency anemia, unspecified: Secondary | ICD-10-CM

## 2010-09-14 DIAGNOSIS — I2699 Other pulmonary embolism without acute cor pulmonale: Secondary | ICD-10-CM

## 2010-09-14 DIAGNOSIS — Z86718 Personal history of other venous thrombosis and embolism: Secondary | ICD-10-CM

## 2010-09-14 LAB — COMPREHENSIVE METABOLIC PANEL
ALT: 15 U/L (ref 0–35)
AST: 13 U/L (ref 0–37)
Albumin: 3.9 g/dL (ref 3.5–5.2)
Alkaline Phosphatase: 62 U/L (ref 39–117)
BUN: 9 mg/dL (ref 6–23)
CO2: 23 mEq/L (ref 19–32)
Calcium: 9.1 mg/dL (ref 8.4–10.5)
Chloride: 106 mEq/L (ref 96–112)
Creatinine, Ser: 0.76 mg/dL (ref 0.40–1.20)
Glucose, Bld: 156 mg/dL — ABNORMAL HIGH (ref 70–99)
Potassium: 3.8 mEq/L (ref 3.5–5.3)
Sodium: 141 mEq/L (ref 135–145)
Total Bilirubin: 0.3 mg/dL (ref 0.3–1.2)
Total Protein: 6.8 g/dL (ref 6.0–8.3)

## 2010-09-14 LAB — CBC WITH DIFFERENTIAL/PLATELET
BASO%: 0.4 % (ref 0.0–2.0)
Basophils Absolute: 0 10*3/uL (ref 0.0–0.1)
EOS%: 8.2 % — ABNORMAL HIGH (ref 0.0–7.0)
Eosinophils Absolute: 0.5 10*3/uL (ref 0.0–0.5)
HCT: 38 % (ref 34.8–46.6)
HGB: 12.3 g/dL (ref 11.6–15.9)
LYMPH%: 41.2 % (ref 14.0–49.7)
MCH: 27.5 pg (ref 25.1–34.0)
MCHC: 32.4 g/dL (ref 31.5–36.0)
MCV: 85 fL (ref 79.5–101.0)
MONO#: 0.3 10*3/uL (ref 0.1–0.9)
MONO%: 5.6 % (ref 0.0–14.0)
NEUT#: 2.5 10*3/uL (ref 1.5–6.5)
NEUT%: 44.6 % (ref 38.4–76.8)
Platelets: 296 10*3/uL (ref 145–400)
RBC: 4.47 10*6/uL (ref 3.70–5.45)
RDW: 14.6 % — ABNORMAL HIGH (ref 11.2–14.5)
WBC: 5.5 10*3/uL (ref 3.9–10.3)
lymph#: 2.3 10*3/uL (ref 0.9–3.3)

## 2010-09-14 LAB — HEPARIN ANTI-XA: Heparin LMW: 0.59 IU/mL

## 2010-09-14 LAB — LACTATE DEHYDROGENASE: LDH: 125 U/L (ref 94–250)

## 2010-09-17 LAB — IRON AND TIBC
%SAT: 39 % (ref 20–55)
Iron: 103 ug/dL (ref 42–145)
TIBC: 261 ug/dL (ref 250–470)
UIBC: 158 ug/dL

## 2010-09-17 LAB — TRANSFERRIN RECEPTOR, SOLUABLE: Transferrin Receptor, Soluble: 1.14 mg/L (ref 0.76–1.76)

## 2010-09-17 LAB — FERRITIN: Ferritin: 127 ng/mL (ref 10–291)

## 2010-09-22 LAB — CBC
HCT: 32.3 % — ABNORMAL LOW (ref 36.0–46.0)
Hemoglobin: 10.6 g/dL — ABNORMAL LOW (ref 12.0–15.0)
MCHC: 33 g/dL (ref 30.0–36.0)
MCV: 80.9 fL (ref 78.0–100.0)
Platelets: 364 10*3/uL (ref 150–400)
RBC: 3.99 MIL/uL (ref 3.87–5.11)
RDW: 19.3 % — ABNORMAL HIGH (ref 11.5–15.5)
WBC: 6.6 10*3/uL (ref 4.0–10.5)

## 2010-09-22 LAB — DIFFERENTIAL
Basophils Absolute: 0.1 10*3/uL (ref 0.0–0.1)
Basophils Relative: 2 % — ABNORMAL HIGH (ref 0–1)
Eosinophils Absolute: 0.2 10*3/uL (ref 0.0–0.7)
Eosinophils Relative: 3 % (ref 0–5)
Lymphocytes Relative: 45 % (ref 12–46)
Lymphs Abs: 2.9 10*3/uL (ref 0.7–4.0)
Monocytes Absolute: 0.4 10*3/uL (ref 0.1–1.0)
Monocytes Relative: 6 % (ref 3–12)
Neutro Abs: 3 10*3/uL (ref 1.7–7.7)
Neutrophils Relative %: 45 % (ref 43–77)

## 2010-09-22 LAB — BASIC METABOLIC PANEL
BUN: 8 mg/dL (ref 6–23)
CO2: 22 mEq/L (ref 19–32)
Calcium: 9.2 mg/dL (ref 8.4–10.5)
Chloride: 110 mEq/L (ref 96–112)
Creatinine, Ser: 0.89 mg/dL (ref 0.4–1.2)
GFR calc Af Amer: >60 mL/min (ref >60)
GFR calc non Af Amer: >60 mL/min (ref >60)
Glucose, Bld: 89 mg/dL (ref 70–99)
Potassium: 4.1 mEq/L (ref 3.5–5.1)
Sodium: 137 mEq/L (ref 135–145)

## 2010-09-24 LAB — BASIC METABOLIC PANEL
BUN: 4 mg/dL — ABNORMAL LOW (ref 6–23)
CO2: 22 mEq/L (ref 19–32)
Calcium: 8.7 mg/dL (ref 8.4–10.5)
Chloride: 108 mEq/L (ref 96–112)
Creatinine, Ser: 0.87 mg/dL (ref 0.4–1.2)
GFR calc Af Amer: >60 mL/min (ref >60)
GFR calc non Af Amer: >60 mL/min (ref >60)
Glucose, Bld: 118 mg/dL — ABNORMAL HIGH (ref 70–99)
Potassium: 3.4 mEq/L — ABNORMAL LOW (ref 3.5–5.1)
Sodium: 139 mEq/L (ref 135–145)

## 2010-09-24 LAB — PROTIME-INR
INR: 1 (ref 0.00–1.49)
Prothrombin Time: 12.6 seconds (ref 11.6–15.2)

## 2010-09-24 LAB — POCT CARDIAC MARKERS
CKMB, poc: <1 ng/mL — ABNORMAL LOW (ref 1.0–8.0)
Myoglobin, poc: 37.5 ng/mL (ref 12–200)
Troponin i, poc: <0.05 ng/mL (ref 0.00–0.09)

## 2010-09-24 LAB — DIFFERENTIAL
Basophils Absolute: 0 10*3/uL (ref 0.0–0.1)
Basophils Relative: 0 % (ref 0–1)
Eosinophils Absolute: 0.3 10*3/uL (ref 0.0–0.7)
Eosinophils Relative: 3 % (ref 0–5)
Lymphocytes Relative: 39 % (ref 12–46)
Lymphs Abs: 3.2 10*3/uL (ref 0.7–4.0)
Monocytes Absolute: 0.1 10*3/uL (ref 0.1–1.0)
Monocytes Relative: 1 % — ABNORMAL LOW (ref 3–12)
Neutro Abs: 4.5 10*3/uL (ref 1.7–7.7)
Neutrophils Relative %: 56 % (ref 43–77)

## 2010-09-24 LAB — CBC
HCT: 32.2 % — ABNORMAL LOW (ref 36.0–46.0)
Hemoglobin: 10.5 g/dL — ABNORMAL LOW (ref 12.0–15.0)
MCHC: 32.5 g/dL (ref 30.0–36.0)
MCV: 81.4 fL (ref 78.0–100.0)
Platelets: 366 10*3/uL (ref 150–400)
RBC: 3.95 MIL/uL (ref 3.87–5.11)
RDW: 16.9 % — ABNORMAL HIGH (ref 11.5–15.5)
WBC: 8.1 10*3/uL (ref 4.0–10.5)

## 2010-09-25 LAB — CBC
HCT: 30.4 % — ABNORMAL LOW (ref 36.0–46.0)
HCT: 30.5 % — ABNORMAL LOW (ref 36.0–46.0)
HCT: 31.3 % — ABNORMAL LOW (ref 36.0–46.0)
HCT: 32.7 % — ABNORMAL LOW (ref 36.0–46.0)
HCT: 33.5 % — ABNORMAL LOW (ref 36.0–46.0)
Hemoglobin: 10 g/dL — ABNORMAL LOW (ref 12.0–15.0)
Hemoglobin: 10.2 g/dL — ABNORMAL LOW (ref 12.0–15.0)
Hemoglobin: 10.6 g/dL — ABNORMAL LOW (ref 12.0–15.0)
Hemoglobin: 11 g/dL — ABNORMAL LOW (ref 12.0–15.0)
Hemoglobin: 9.9 g/dL — ABNORMAL LOW (ref 12.0–15.0)
MCHC: 32.4 g/dL (ref 30.0–36.0)
MCHC: 32.4 g/dL (ref 30.0–36.0)
MCHC: 32.6 g/dL (ref 30.0–36.0)
MCHC: 32.9 g/dL (ref 30.0–36.0)
MCHC: 33 g/dL (ref 30.0–36.0)
MCV: 80.9 fL (ref 78.0–100.0)
MCV: 81 fL (ref 78.0–100.0)
MCV: 81.3 fL (ref 78.0–100.0)
MCV: 81.5 fL (ref 78.0–100.0)
MCV: 81.8 fL (ref 78.0–100.0)
Platelets: 298 10*3/uL (ref 150–400)
Platelets: 301 10*3/uL (ref 150–400)
Platelets: 304 10*3/uL (ref 150–400)
Platelets: 341 10*3/uL (ref 150–400)
Platelets: 354 10*3/uL (ref 150–400)
RBC: 3.74 MIL/uL — ABNORMAL LOW (ref 3.87–5.11)
RBC: 3.75 MIL/uL — ABNORMAL LOW (ref 3.87–5.11)
RBC: 3.85 MIL/uL — ABNORMAL LOW (ref 3.87–5.11)
RBC: 3.99 MIL/uL (ref 3.87–5.11)
RBC: 4.15 MIL/uL (ref 3.87–5.11)
RDW: 17.9 % — ABNORMAL HIGH (ref 11.5–15.5)
RDW: 18 % — ABNORMAL HIGH (ref 11.5–15.5)
RDW: 18.1 % — ABNORMAL HIGH (ref 11.5–15.5)
RDW: 18.3 % — ABNORMAL HIGH (ref 11.5–15.5)
RDW: 18.5 % — ABNORMAL HIGH (ref 11.5–15.5)
WBC: 4.9 10*3/uL (ref 4.0–10.5)
WBC: 5.9 10*3/uL (ref 4.0–10.5)
WBC: 6 10*3/uL (ref 4.0–10.5)
WBC: 6.6 10*3/uL (ref 4.0–10.5)
WBC: 6.8 10*3/uL (ref 4.0–10.5)

## 2010-09-25 LAB — CARDIOLIPIN ANTIBODIES, IGG, IGM, IGA
Anticardiolipin IgA: <10 [APL'U] — ABNORMAL LOW (ref <12)
Anticardiolipin IgG: <10 [GPL'U] — ABNORMAL LOW (ref <15)
Anticardiolipin IgM: 33 [MPL'U] (ref <13)

## 2010-09-25 LAB — POCT CARDIAC MARKERS
CKMB, poc: <1 ng/mL — ABNORMAL LOW (ref 1.0–8.0)
CKMB, poc: <1 ng/mL — ABNORMAL LOW (ref 1.0–8.0)
CKMB, poc: <1 ng/mL — ABNORMAL LOW (ref 1.0–8.0)
Myoglobin, poc: 30.2 ng/mL (ref 12–200)
Myoglobin, poc: 34.1 ng/mL (ref 12–200)
Myoglobin, poc: 40.7 ng/mL (ref 12–200)
Troponin i, poc: <0.05 ng/mL (ref 0.00–0.09)
Troponin i, poc: <0.05 ng/mL (ref 0.00–0.09)
Troponin i, poc: <0.05 ng/mL (ref 0.00–0.09)

## 2010-09-25 LAB — DIFFERENTIAL
Basophils Absolute: 0 10*3/uL (ref 0.0–0.1)
Basophils Absolute: 0.1 10*3/uL (ref 0.0–0.1)
Basophils Relative: 1 % (ref 0–1)
Basophils Relative: 2 % — ABNORMAL HIGH (ref 0–1)
Eosinophils Absolute: 0.2 10*3/uL (ref 0.0–0.7)
Eosinophils Absolute: 0.3 10*3/uL (ref 0.0–0.7)
Eosinophils Relative: 4 % (ref 0–5)
Eosinophils Relative: 5 % (ref 0–5)
Lymphocytes Relative: 44 % (ref 12–46)
Lymphocytes Relative: 45 % (ref 12–46)
Lymphs Abs: 2.6 10*3/uL (ref 0.7–4.0)
Lymphs Abs: 3 10*3/uL (ref 0.7–4.0)
Monocytes Absolute: 0.4 10*3/uL (ref 0.1–1.0)
Monocytes Absolute: 0.4 10*3/uL (ref 0.1–1.0)
Monocytes Relative: 6 % (ref 3–12)
Monocytes Relative: 7 % (ref 3–12)
Neutro Abs: 2.6 10*3/uL (ref 1.7–7.7)
Neutro Abs: 3 10*3/uL (ref 1.7–7.7)
Neutrophils Relative %: 43 % (ref 43–77)
Neutrophils Relative %: 44 % (ref 43–77)

## 2010-09-25 LAB — LIPID PANEL
Cholesterol: 114 mg/dL (ref 0–200)
HDL: 49 mg/dL (ref >39)
LDL Cholesterol: 38 mg/dL (ref 0–99)
Total CHOL/HDL Ratio: 2.3 RATIO
Triglycerides: 133 mg/dL (ref <150)
VLDL: 27 mg/dL (ref 0–40)

## 2010-09-25 LAB — BASIC METABOLIC PANEL
BUN: 7 mg/dL (ref 6–23)
BUN: 9 mg/dL (ref 6–23)
CO2: 23 mEq/L (ref 19–32)
CO2: 23 mEq/L (ref 19–32)
Calcium: 9 mg/dL (ref 8.4–10.5)
Calcium: 9.4 mg/dL (ref 8.4–10.5)
Chloride: 109 mEq/L (ref 96–112)
Chloride: 110 mEq/L (ref 96–112)
Creatinine, Ser: 0.77 mg/dL (ref 0.4–1.2)
Creatinine, Ser: 0.79 mg/dL (ref 0.4–1.2)
GFR calc Af Amer: >60 mL/min (ref >60)
GFR calc Af Amer: >60 mL/min (ref >60)
GFR calc non Af Amer: >60 mL/min (ref >60)
GFR calc non Af Amer: >60 mL/min (ref >60)
Glucose, Bld: 105 mg/dL — ABNORMAL HIGH (ref 70–99)
Glucose, Bld: 112 mg/dL — ABNORMAL HIGH (ref 70–99)
Potassium: 3.6 mEq/L (ref 3.5–5.1)
Potassium: 3.7 mEq/L (ref 3.5–5.1)
Sodium: 139 mEq/L (ref 135–145)
Sodium: 139 mEq/L (ref 135–145)

## 2010-09-25 LAB — URINALYSIS, ROUTINE W REFLEX MICROSCOPIC
Bilirubin Urine: NEGATIVE
Bilirubin Urine: NEGATIVE
Glucose, UA: NEGATIVE mg/dL
Glucose, UA: NEGATIVE mg/dL
Hgb urine dipstick: NEGATIVE
Ketones, ur: NEGATIVE mg/dL
Ketones, ur: NEGATIVE mg/dL
Nitrite: NEGATIVE
Nitrite: NEGATIVE
Protein, ur: NEGATIVE mg/dL
Protein, ur: NEGATIVE mg/dL
Specific Gravity, Urine: 1.002 — ABNORMAL LOW (ref 1.005–1.030)
Specific Gravity, Urine: 1.01 (ref 1.005–1.030)
Urobilinogen, UA: 0.2 mg/dL (ref 0.0–1.0)
Urobilinogen, UA: 0.2 mg/dL (ref 0.0–1.0)
pH: 6.5 (ref 5.0–8.0)
pH: 7 (ref 5.0–8.0)

## 2010-09-25 LAB — POCT PREGNANCY, URINE: Preg Test, Ur: NEGATIVE

## 2010-09-25 LAB — URINE CULTURE
Colony Count: 50000
Colony Count: NO GROWTH
Culture: NO GROWTH

## 2010-09-25 LAB — IRON AND TIBC
Iron: 18 ug/dL — ABNORMAL LOW (ref 42–135)
Saturation Ratios: 5 % — ABNORMAL LOW (ref 20–55)
TIBC: 331 ug/dL (ref 250–470)
UIBC: 313 ug/dL

## 2010-09-25 LAB — LUPUS ANTICOAGULANT PANEL
DRVVT: 44.2 secs (ref 36.1–47.0)
Lupus Anticoagulant: NOT DETECTED
PTT Lupus Anticoagulant: 200 secs — ABNORMAL HIGH (ref 36.3–48.8)
PTTLA 4:1 Mix: 130.1 secs — ABNORMAL HIGH (ref 36.3–48.8)
PTTLA Confirmation: 0 secs (ref <8.0)

## 2010-09-25 LAB — APTT
aPTT: 29 seconds (ref 24–37)
aPTT: 45 seconds — ABNORMAL HIGH (ref 24–37)

## 2010-09-25 LAB — PROTIME-INR
INR: 1 (ref 0.00–1.49)
INR: 1.1 (ref 0.00–1.49)
INR: 1.3 (ref 0.00–1.49)
INR: 1.9 — ABNORMAL HIGH (ref 0.00–1.49)
Prothrombin Time: 12.6 seconds (ref 11.6–15.2)
Prothrombin Time: 14 seconds (ref 11.6–15.2)
Prothrombin Time: 15.9 seconds — ABNORMAL HIGH (ref 11.6–15.2)
Prothrombin Time: 21.5 seconds — ABNORMAL HIGH (ref 11.6–15.2)

## 2010-09-25 LAB — CK TOTAL AND CKMB (NOT AT ARMC)
CK, MB: 0.7 ng/mL (ref 0.3–4.0)
CK, MB: 1 ng/mL (ref 0.3–4.0)
CK, MB: 1.3 ng/mL (ref 0.3–4.0)
Relative Index: 0.7 (ref 0.0–2.5)
Relative Index: 0.9 (ref 0.0–2.5)
Relative Index: 1.1 (ref 0.0–2.5)
Total CK: 100 U/L (ref 7–177)
Total CK: 109 U/L (ref 7–177)
Total CK: 117 U/L (ref 7–177)

## 2010-09-25 LAB — HEPARIN LEVEL (UNFRACTIONATED)
Heparin Unfractionated: 0.13 IU/mL — ABNORMAL LOW (ref 0.30–0.70)
Heparin Unfractionated: 0.42 IU/mL (ref 0.30–0.70)
Heparin Unfractionated: 0.91 IU/mL — ABNORMAL HIGH (ref 0.30–0.70)

## 2010-09-25 LAB — D-DIMER, QUANTITATIVE: D-Dimer, Quant: 0.52 ug/mL-FEU — ABNORMAL HIGH (ref 0.00–0.48)

## 2010-09-25 LAB — TROPONIN I
Troponin I: 0.01 ng/mL (ref 0.00–0.06)
Troponin I: 0.01 ng/mL (ref 0.00–0.06)
Troponin I: <0.01 ng/mL (ref 0.00–0.06)

## 2010-09-25 LAB — BETA-2-GLYCOPROTEIN I ABS, IGG/M/A
Beta-2 Glyco I IgG: <10 U/mL (ref <20)
Beta-2-Glycoprotein I IgA: <10 U/mL (ref <20)
Beta-2-Glycoprotein I IgM: <10 U/mL (ref <20)

## 2010-09-25 LAB — PROTEIN C, TOTAL: Protein C, Total: 72 % (ref 70–140)

## 2010-09-25 LAB — URINE MICROSCOPIC-ADD ON

## 2010-09-25 LAB — PREGNANCY, URINE: Preg Test, Ur: NEGATIVE

## 2010-09-25 LAB — VITAMIN B12: Vitamin B-12: 841 pg/mL (ref 211–911)

## 2010-09-25 LAB — PROTEIN C ACTIVITY: Protein C Activity: 112 % (ref 75–133)

## 2010-09-25 LAB — FOLATE: Folate: 6.7 ng/mL

## 2010-09-25 LAB — RETICULOCYTES
RBC.: 3.96 MIL/uL (ref 3.87–5.11)
Retic Count, Absolute: 35.6 10*3/uL (ref 19.0–186.0)
Retic Ct Pct: 0.9 % (ref 0.4–3.1)

## 2010-09-25 LAB — HOMOCYSTEINE: Homocysteine: 8.3 umol/L (ref 4.0–15.4)

## 2010-09-25 LAB — PROTEIN S, TOTAL: Protein S Ag, Total: 98 % (ref 70–140)

## 2010-09-25 LAB — ANTITHROMBIN III: AntiThromb III Func: 109 % (ref 76–126)

## 2010-09-25 LAB — PROTEIN S ACTIVITY: Protein S Activity: 91 % (ref 69–129)

## 2010-09-25 LAB — TSH: TSH: 0.838 u[IU]/mL (ref 0.350–4.500)

## 2010-09-25 LAB — PROTHROMBIN GENE MUTATION

## 2010-09-25 LAB — FACTOR 5 LEIDEN

## 2010-09-25 LAB — FERRITIN: Ferritin: 9 ng/mL — ABNORMAL LOW (ref 10–291)

## 2010-09-26 LAB — POCT CARDIAC MARKERS
CKMB, poc: <1 ng/mL — ABNORMAL LOW (ref 1.0–8.0)
Myoglobin, poc: 25.3 ng/mL (ref 12–200)
Troponin i, poc: <0.05 ng/mL (ref 0.00–0.09)

## 2010-09-26 LAB — POCT I-STAT, CHEM 8
BUN: 8 mg/dL (ref 6–23)
Calcium, Ion: 1.2 mmol/L (ref 1.12–1.32)
Chloride: 108 mEq/L (ref 96–112)
Creatinine, Ser: 0.7 mg/dL (ref 0.4–1.2)
Glucose, Bld: 96 mg/dL (ref 70–99)
HCT: 35 % — ABNORMAL LOW (ref 36.0–46.0)
Hemoglobin: 11.9 g/dL — ABNORMAL LOW (ref 12.0–15.0)
Potassium: 3.7 mEq/L (ref 3.5–5.1)
Sodium: 139 mEq/L (ref 135–145)
TCO2: 21 mmol/L (ref 0–100)

## 2010-11-02 NOTE — Discharge Summary (Signed)
Maria [name], Maria [name]               ACCOUNT NO.:  [id number]   MEDICAL RECORD NO.:  [id number]          PATIENT TYPE:  INP   LOCATION:  4728                         FACILITY:  [facility]   PHYSICIAN:  Marcellus Scott, MD     DATE OF BIRTH:  1966-04-07   DATE OF ADMISSION:  01/23/2009  DATE OF DISCHARGE:  01/25/2009                               DISCHARGE SUMMARY   PRIMARY MEDICAL DOCTOR:  Sharlot Gowda, M.D.   NEUROLOGIST:  Dr. Albertina Senegal, [facility], [place].   DISCHARGE DIAGNOSES:  1. Bilateral acute pulmonary embolism.  2. Urinary tract infection.  3. Iron deficiency anemia.  4. Menorrhagia.  5. Tobacco abuse.  6. Chronic pain secondary to low back pain, on narcotic pain      medications.   DISCHARGE MEDICATIONS:  These are to be appended prior to discharge:  1. Oxycodone/acetaminophen 10/325 mg tablet 1 tablet p.o. q.i.d.      p.r.n.  2. Topiramate 100 mg p.o. b.i.d.  3. Nortriptyline 50 mg tablets 3 tablets p.o. q.h.s.  4. Morphine sulfate CR 15 mg p.o. t.i.d.  5. Methocarbamol 750 mg tablet 1-2 tablets p.o. t.i.d. p.r.n.  6. Lorazepam 2 mg tablet 1-2 tablets p.o. q.h.s. p.r.n.  7. Ceftin 500 mg p.o. b.i.d. through January 29, 2009, then      discontinue.  8. Nicotine 14 mg per 24 hour patch daily for 5 weeks then taper.  9. Ferrous sulfate 325 mg p.o. b.i.d.  10.Lovenox 120 mg subcutaneously daily to complete 5 days of bridging      with Coumadin and an additional 2 days after the INR is therapeutic      greater than 2 then discontinue.  11.Coumadin as per pharmacy after INR checked tomorrow.   PROCEDURES:  1. CT angiogram of the chest January 23, 2009.  Impression - study is      positive for pulmonary embolus in segmental branches of the left      lower lobe and right middle lobe.  2. Chest x-ray August 6.  Impression - no acute disease.   PERTINENT LABS:  INR today 1.1.  CBC - hemoglobin 9.9, hematocrit 31,  white blood cell 4.9, platelets 298.  Anemia  panel with absolute  reticulocytes 36, iron 18, total iron binding capacity 331, percentage  saturation 5, vitamin B12 841, serum folate 6.7, ferritin 9, TSH 0.838.  Urine culture 50,000 colonies per mL with multiple bacteria morphocytes  present suggestive of contamination.  Homocysteine 8.3.  Lipid panel  unremarkable.  Cardiac enzymes were cycled and negative x3.  Urinalysis  with too numerous to count white blood cells and many bacteria, D-dimer  0.52.  Urine pregnancy test negative.  Basic metabolic panel  unremarkable with BUN 7, creatinine 0.77.   CONSULTATIONS:  None.   HOSPITAL COURSE AND PATIENT DISPOSITION:  Mr. Angelica is a pleasant 45-  year-old [place] American female patient with history of diet controlled  hypertension, chronic anemia, chronic pain on opiate pain medications  who experienced subacute onset of lower substernal chest pain.  This was  associated with dyspnea.  She presented to  the emergency room where CT  of the chest confirmed bilateral pulmonary embolism.  She was thereby  admitted for further evaluation and management.  1. Bilateral acute pulmonary embolism.  The patient was admitted to      telemetry and she had no arrhythmia alarms.  She has remained      hemodynamically stable.  The patient was initially started on IV      heparin which has been switched to Lovenox yesterday.  She is on      Coumadin.  Hypercoagulable panel has been sent and is pending.      Echo has been requested and has yet to be performed.  It looks like      this is her first episode of such thromboembolism.  The patient      does not have a confirmed deep vein thrombosis.  She should be able      to go home tomorrow on full dose Lovenox and Coumadin after      followup appointment with a definite date for INR check is made      with her primary medical doctor.  Also, echo has to be followed      prior to discharge.  2. Urinary tract infection where the patient was symptomatic as  well      as her urinalysis was positive.  However, the urine culture is not      supportive and seems like a contaminant sample.  Will complete a      week's course of antibiotics.  3. Iron deficiency anemia which is probably secondary to her      menorrhagia.  Consider outpatient gynecology evaluation plus or      minus GI evaluation as deemed necessary.  Will start on oral iron      sulfate.  Recommend following the patient's CBC as an outpatient as      deemed necessary.  4. Tobacco abuse.  Cessation counseling done.  Nicotine patch if the      patient desires.  5. Chronic pain syndrome.  Continue home pain medications.   DISPOSITION:  The patient should be ready for possible discharge in the  morning after followup of her echo, INR results and arrangement with her  primary MD regarding following INR and CBCs.  The patient will be  educated on self-administration of Lovenox.      Marcellus Scott, MD  Electronically Signed     Marcellus Scott, MD  Electronically Signed    AH/MEDQ  D:  01/25/2009  T:  01/25/2009  Job:  [id number]   cc:   Sharlot Gowda, M.D.

## 2010-11-02 NOTE — H&P (Signed)
Maria, [name]               ACCOUNT NO.:  [id number]   MEDICAL RECORD NO.:  [id number]          PATIENT TYPE:  EMS   LOCATION:  [facility]                         FACILITY:  [facility]   PHYSICIAN:  Marcellus Scott, MD     DATE OF BIRTH:  [birth year]   DATE OF ADMISSION:  01/23/2009  DATE OF DISCHARGE:                              HISTORY & PHYSICAL   PRIMARY MEDICAL DOCTOR:  Sharlot Gowda, M.D.   PAIN MD OR NEUROLOGIST:  Dr. Albertina Senegal in [place], [place].   CHIEF COMPLAINTS:  Chest pain and dyspnea.   HISTORY OF PRESENT ILLNESS:  Maria [name] is a pleasant 45 year old  African American female patient with history of diet controlled  hypertension, chronic anemia, chronic pain on opioid pain medications  who was in her usual state of health until sometime yesterday afternoon.  The patient indicates she was sitting and watching television when she  experienced subacute onset of lower substernal squeezing type of 10/10  chest pain with radiation to the back.  This was associated with  dyspnea.  There was no wheezing, cough, fever, dizziness,  lightheadedness, nausea or vomiting.  There was no radiation to the jaw  or to the extremities.  She does give a history of chronic left shoulder  pain.  Since then the patient indicates the pain lasts for about 15  minutes and subsides spontaneously only to return.  She was not planning  to come to the hospital.  However, when she brought this to the  attention of her elder daughter she was advised to come to the ED.  In  the emergency room she was found to have bilateral acute pulmonary  embolism on CT studies.  Currently the patient is chest pain-free and  dyspnea is better.   The patient denies history of long distance travel.  She indicates that  a couple of months back she was told to have a clot in the leg, however,  on reviewing the venous Dopplers of the right lower extremity on September 11, 2008. there was no evidence of  DVT or superficial thrombosis or  Baker's cyst.  She has never been on Coumadin.  She denies being on oral  contraceptive pills but has had tubal ligations.   PAST MEDICAL HISTORY:  1. Hypertension.  2. Chronic anemia.  3. Migraine.  4. Back pain.  5. Bilateral knee pain.   PAST SURGICAL HISTORY:  1. Cesarean section.  2. Abdominal hernia repair.  3. Question intestinal tumor resection during the hernia repair.   PSYCHIATRIC HISTORY:  None.   ALLERGIES:  No known drug allergies.   MEDICATIONS:  These medications were obtained by calling the [facility]  pharmacy:  1. Lorazepam 2 mg tablets 1-2 tablets p.o. q.h.s. p.r.n.  2. Oxycodone/acetaminophen 10/325 mg tablet 1 tablet q.i.d. p.r.n.  3. Topiramate 100 mg tablet 1 tablet p.o. b.i.d.  4. Nortriptyline 50 mg tablet 3 tablets p.o. q.h.s.  5. Morphine sulfate CR 15 mg p.o. t.i.d.  6. Methocarbamol 750 mg tablet 1-2 tablets p.o. t.i.d. p.r.n.   FAMILY HISTORY:  The patient's mom,age 74  years died recently from colon  cancer.  The patient's father has history of diabetes mellitus.   SOCIAL HISTORY:  The patient is married, lives with her five children  and uses a cane for walking.  She is unemployed.  She smokes up to half  pack of cigarettes per day since age 36 years.  She says she drinks beer  socially, maybe once or two times a week.  There is no history of drug  abuse.   REVIEW OF SYSTEMS:  Comprehensive 10 system review done and apart from  history of presenting illness is pertinent for urinary frequency,  dysuria and urgency.  No vaginal discharge.  The patient denies any  fevers or chills.  Also, the patient has chronic pains in the lower  back, bilateral knees and left shoulder.  Last menstrual period  approximately a month ago.  The patient has bleeding for approximately 7  days with heavy bleeding from day 2 to day 6.  The patient denies  dizziness, lightheadedness, weakness.   PHYSICAL EXAMINATION:  GENERAL:  Ms.  Hoglund is a moderately built and  obese female patient in no obvious distress.  VITAL SIGNS:  Temperature 97.7 degrees Fahrenheit, blood pressure 100/67  mmHg, pulse 80 per minute and regular, respirations 16 per minute and  saturating at 100% on room air.  HEENT:  Head nontraumatic, normocephalic.  EYES: Pupils equally reacting to light and accommodation.  Bilateral  immature cataracts.  ENT: is slightly dry oral mucosa but otherwise no oropharyngeal erythema  or thrush.  NECK:  Supple.  No JVD or carotid bruit.  LYMPHATICS:  No lymphadenopathy.  RESPIRATORY:  Clear to auscultation.  CARDIOVASCULAR:  First and second heart sounds heard.  Regular rate and  rhythm.  No murmurs or rubs.  ABDOMEN:  Obese.  Nondistended, nontender.  No organomegaly or mass  appreciated.  Bowel sounds are normally heard.  CENTRAL NERVOUS SYSTEM:  The patient is awake, alert and oriented x3  with no focal neurological deficits.  EXTREMITIES:  With no cyanosis, clubbing or edema.  Peripheral pulses  are symmetrically felt.  There is no swelling of the knees or legs or  any acute findings.  SKIN:  Without any rashes.  MUSCULOSKELETAL:  Unremarkable.  BREASTS:  Deferred.   LAB DATA:  INR is 1.  Urinalysis shows too numerous to count white blood  cells, many bacteria.  D-dimer is positive at 0.52.  Urine pregnancy  test was negative.  Basic metabolic panel unremarkable with BUN 7,  creatinine 0.77.  CBC  -hemoglobin 10.6, hematocrit 33, white blood cell  6.8, platelets 354, MCV is 81.8.  Point of care cardiac markers x1  negative.  CT angiogram of the chest.  Impression - positive pulmonary  embolus clots seen in segmental branches of the left lower lobe and  right middle lobe segmental branch.  No pleural or pericardial effusion.  Heart size is normal.  Chest x-ray from August 6 -there was no acute  disease.  EKG is normal sinus rhythm at 93 beats per minute with normal  axis.  Voltage appears low.   Q-waves in inferior leads, that is III and  aVF.  There are no other acute ischemic changes.   ASSESSMENT AND PLAN:  1. Bilateral acute PE (pulmonary embolus) in the left lower lobe and      right middle lobe segmental branches.  Will admit the patient to      telemetry.  Will request hypercoagulable panel.  The patient  has      already been started on IV heparin per pharmacy.  Will start      Coumadin per pharmacy.  Will provide oxygen and pain medications      p.r.n.  Will obtain 2-D echo to check for right and left      ventricular function.  Clinically there does not appear to be any      strain.  2. Urinary tract infection.  Will send urine for culture and place the      patient on IV Rocephin pending cultures.  The patient has received      a single dose of Bactrim in the ED.  3. Normocytic anemia secondary to menorrhagia and probably associated      iron deficiency.  Will check anemia panel and follow CBCs.  4. Tobacco abuse.  Cessation counseling done and will provide nicotine      patch.  5. Chronic pain.  Will continue with home medications.   Time taken coordinating this admission is an hour and 5 minutes.      Marcellus Scott, MD  Electronically Signed     Marcellus Scott, MD  Electronically Signed    AH/MEDQ  D:  01/23/2009  T:  01/23/2009  Job:  [id number]   cc:   Sharlot Gowda, M.D.

## 2010-11-09 ENCOUNTER — Emergency Department (HOSPITAL_COMMUNITY)
Admission: EM | Admit: 2010-11-09 | Discharge: 2010-11-09 | Disposition: A | Discharge status: Home / Self Care | Payer: Medicaid Other | Attending: Emergency Medicine | Admitting: Emergency Medicine

## 2010-11-09 ENCOUNTER — Emergency Department (HOSPITAL_COMMUNITY): Payer: Medicaid Other

## 2010-11-09 DIAGNOSIS — G2581 Restless legs syndrome: Secondary | ICD-10-CM | POA: Insufficient documentation

## 2010-11-09 DIAGNOSIS — R0789 Other chest pain: Secondary | ICD-10-CM | POA: Insufficient documentation

## 2010-11-09 DIAGNOSIS — R51 Headache: Secondary | ICD-10-CM | POA: Insufficient documentation

## 2010-11-09 DIAGNOSIS — I10 Essential (primary) hypertension: Secondary | ICD-10-CM | POA: Insufficient documentation

## 2010-11-09 LAB — POCT I-STAT, CHEM 8
BUN: 8 mg/dL (ref 6–23)
Calcium, Ion: 1.09 mmol/L — ABNORMAL LOW (ref 1.12–1.32)
Chloride: 103 mEq/L (ref 96–112)
Creatinine, Ser: 0.9 mg/dL (ref 0.4–1.2)
Glucose, Bld: 119 mg/dL — ABNORMAL HIGH (ref 70–99)
HCT: 38 % (ref 36.0–46.0)
Hemoglobin: 12.9 g/dL (ref 12.0–15.0)
Potassium: 3.6 mEq/L (ref 3.5–5.1)
Sodium: 140 mEq/L (ref 135–145)
TCO2: 26 mmol/L (ref 0–100)

## 2010-11-09 LAB — CBC
HCT: 36.6 % (ref 36.0–46.0)
Hemoglobin: 11.9 g/dL — ABNORMAL LOW (ref 12.0–15.0)
MCH: 27.7 pg (ref 26.0–34.0)
MCHC: 32.5 g/dL (ref 30.0–36.0)
MCV: 85.3 fL (ref 78.0–100.0)
Platelets: 315 10*3/uL (ref 150–400)
RBC: 4.29 MIL/uL (ref 3.87–5.11)
RDW: 13.2 % (ref 11.5–15.5)
WBC: 7.2 10*3/uL (ref 4.0–10.5)

## 2010-11-09 LAB — DIFFERENTIAL
Basophils Absolute: 0 10*3/uL (ref 0.0–0.1)
Basophils Relative: 0 % (ref 0–1)
Eosinophils Absolute: 0.2 10*3/uL (ref 0.0–0.7)
Eosinophils Relative: 3 % (ref 0–5)
Lymphocytes Relative: 54 % — ABNORMAL HIGH (ref 12–46)
Lymphs Abs: 3.9 10*3/uL (ref 0.7–4.0)
Monocytes Absolute: 0.4 10*3/uL (ref 0.1–1.0)
Monocytes Relative: 6 % (ref 3–12)
Neutro Abs: 2.7 10*3/uL (ref 1.7–7.7)
Neutrophils Relative %: 37 % — ABNORMAL LOW (ref 43–77)

## 2010-11-09 LAB — POCT CARDIAC MARKERS
CKMB, poc: <1 ng/mL — ABNORMAL LOW (ref 1.0–8.0)
Myoglobin, poc: 23.3 ng/mL (ref 12–200)
Troponin i, poc: <0.05 ng/mL (ref 0.00–0.09)

## 2010-11-18 ENCOUNTER — Other Ambulatory Visit (HOSPITAL_COMMUNITY): Payer: Self-pay | Admitting: Oncology

## 2010-11-18 ENCOUNTER — Other Ambulatory Visit: Payer: Self-pay | Admitting: Family Medicine

## 2010-11-18 ENCOUNTER — Ambulatory Visit
Admission: RE | Admit: 2010-11-18 | Discharge: 2010-11-18 | Disposition: A | Discharge status: Home / Self Care | Payer: Medicaid Other | Source: Ambulatory Visit | Attending: Family Medicine | Admitting: Family Medicine

## 2010-11-18 ENCOUNTER — Encounter (HOSPITAL_BASED_OUTPATIENT_CLINIC_OR_DEPARTMENT_OTHER): Payer: Medicaid Other | Admitting: Oncology

## 2010-11-18 DIAGNOSIS — I2699 Other pulmonary embolism without acute cor pulmonale: Secondary | ICD-10-CM

## 2010-11-18 DIAGNOSIS — Z86718 Personal history of other venous thrombosis and embolism: Secondary | ICD-10-CM

## 2010-11-18 DIAGNOSIS — I2782 Chronic pulmonary embolism: Secondary | ICD-10-CM

## 2010-11-18 DIAGNOSIS — R609 Edema, unspecified: Secondary | ICD-10-CM

## 2010-11-18 DIAGNOSIS — D509 Iron deficiency anemia, unspecified: Secondary | ICD-10-CM

## 2010-11-18 LAB — IRON AND TIBC
%SAT: 24 % (ref 20–55)
Iron: 71 ug/dL (ref 42–145)
TIBC: 297 ug/dL (ref 250–470)
UIBC: 226 ug/dL

## 2010-11-18 LAB — CBC WITH DIFFERENTIAL/PLATELET
BASO%: 2 % (ref 0.0–2.0)
Basophils Absolute: 0.1 10*3/uL (ref 0.0–0.1)
EOS%: 3.9 % (ref 0.0–7.0)
Eosinophils Absolute: 0.2 10*3/uL (ref 0.0–0.5)
HCT: 39.1 % (ref 34.8–46.6)
HGB: 13 g/dL (ref 11.6–15.9)
LYMPH%: 37.2 % (ref 14.0–49.7)
MCH: 28.3 pg (ref 25.1–34.0)
MCHC: 33.3 g/dL (ref 31.5–36.0)
MCV: 84.9 fL (ref 79.5–101.0)
MONO#: 0.1 10*3/uL (ref 0.1–0.9)
MONO%: 2.8 % (ref 0.0–14.0)
NEUT#: 2.9 10*3/uL (ref 1.5–6.5)
NEUT%: 54.1 % (ref 38.4–76.8)
Platelets: 271 10*3/uL (ref 145–400)
RBC: 4.6 10*6/uL (ref 3.70–5.45)
RDW: 12.9 % (ref 11.2–14.5)
WBC: 5.3 10*3/uL (ref 3.9–10.3)
lymph#: 2 10*3/uL (ref 0.9–3.3)

## 2010-11-18 LAB — FERRITIN: Ferritin: 78 ng/mL (ref 10–291)

## 2010-11-18 LAB — HEPARIN ANTI-XA: Heparin LMW: 0.69 IU/mL

## 2010-11-30 ENCOUNTER — Other Ambulatory Visit (HOSPITAL_COMMUNITY): Payer: Self-pay | Admitting: Oncology

## 2010-11-30 LAB — FECAL OCCULT BLOOD, GUAIAC: Occult Blood: NEGATIVE

## 2010-12-16 ENCOUNTER — Encounter: Payer: Medicaid Other | Admitting: Oncology

## 2010-12-16 ENCOUNTER — Other Ambulatory Visit (HOSPITAL_COMMUNITY): Payer: Self-pay | Admitting: Oncology

## 2010-12-16 LAB — CBC WITH DIFFERENTIAL/PLATELET
BASO%: 1.3 % (ref 0.0–2.0)
Basophils Absolute: 0.1 10*3/uL (ref 0.0–0.1)
EOS%: 7.5 % — ABNORMAL HIGH (ref 0.0–7.0)
Eosinophils Absolute: 0.3 10*3/uL (ref 0.0–0.5)
HCT: 35.2 % (ref 34.8–46.6)
HGB: 11.8 g/dL (ref 11.6–15.9)
LYMPH%: 47 % (ref 14.0–49.7)
MCH: 28.1 pg (ref 25.1–34.0)
MCHC: 33.5 g/dL (ref 31.5–36.0)
MCV: 83.9 fL (ref 79.5–101.0)
MONO#: 0.3 10*3/uL (ref 0.1–0.9)
MONO%: 7.9 % (ref 0.0–14.0)
NEUT#: 1.5 10*3/uL (ref 1.5–6.5)
NEUT%: 36.3 % — ABNORMAL LOW (ref 38.4–76.8)
Platelets: 265 10*3/uL (ref 145–400)
RBC: 4.19 10*6/uL (ref 3.70–5.45)
RDW: 13.1 % (ref 11.2–14.5)
WBC: 4.1 10*3/uL (ref 3.9–10.3)
lymph#: 1.9 10*3/uL (ref 0.9–3.3)

## 2010-12-16 LAB — FERRITIN: Ferritin: 179 ng/mL (ref 10–291)

## 2011-01-17 ENCOUNTER — Other Ambulatory Visit (HOSPITAL_COMMUNITY): Payer: Self-pay | Admitting: Oncology

## 2011-01-17 ENCOUNTER — Encounter (HOSPITAL_BASED_OUTPATIENT_CLINIC_OR_DEPARTMENT_OTHER): Payer: Medicaid Other | Admitting: Oncology

## 2011-01-17 DIAGNOSIS — I2699 Other pulmonary embolism without acute cor pulmonale: Secondary | ICD-10-CM

## 2011-01-17 DIAGNOSIS — Z86718 Personal history of other venous thrombosis and embolism: Secondary | ICD-10-CM

## 2011-01-17 DIAGNOSIS — D509 Iron deficiency anemia, unspecified: Secondary | ICD-10-CM

## 2011-01-17 DIAGNOSIS — I2782 Chronic pulmonary embolism: Secondary | ICD-10-CM

## 2011-01-17 LAB — CBC WITH DIFFERENTIAL/PLATELET
BASO%: 2.2 % — ABNORMAL HIGH (ref 0.0–2.0)
Basophils Absolute: 0.1 10*3/uL (ref 0.0–0.1)
EOS%: 6.9 % (ref 0.0–7.0)
Eosinophils Absolute: 0.5 10*3/uL (ref 0.0–0.5)
HCT: 36 % (ref 34.8–46.6)
HGB: 12.1 g/dL (ref 11.6–15.9)
LYMPH%: 54.3 % — ABNORMAL HIGH (ref 14.0–49.7)
MCH: 28.6 pg (ref 25.1–34.0)
MCHC: 33.8 g/dL (ref 31.5–36.0)
MCV: 84.7 fL (ref 79.5–101.0)
MONO#: 0.3 10*3/uL (ref 0.1–0.9)
MONO%: 4.1 % (ref 0.0–14.0)
NEUT#: 2.2 10*3/uL (ref 1.5–6.5)
NEUT%: 32.5 % — ABNORMAL LOW (ref 38.4–76.8)
Platelets: 271 10*3/uL (ref 145–400)
RBC: 4.25 10*6/uL (ref 3.70–5.45)
RDW: 14.1 % (ref 11.2–14.5)
WBC: 6.8 10*3/uL (ref 3.9–10.3)
lymph#: 3.7 10*3/uL — ABNORMAL HIGH (ref 0.9–3.3)

## 2011-01-17 LAB — COMPREHENSIVE METABOLIC PANEL
ALT: 21 U/L (ref 0–35)
AST: 20 U/L (ref 0–37)
Albumin: 4 g/dL (ref 3.5–5.2)
Alkaline Phosphatase: 92 U/L (ref 39–117)
BUN: 9 mg/dL (ref 6–23)
CO2: 23 mEq/L (ref 19–32)
Calcium: 8.9 mg/dL (ref 8.4–10.5)
Chloride: 105 mEq/L (ref 96–112)
Creatinine, Ser: 0.68 mg/dL (ref 0.50–1.10)
Glucose, Bld: 98 mg/dL (ref 70–99)
Potassium: 3.7 mEq/L (ref 3.5–5.3)
Sodium: 139 mEq/L (ref 135–145)
Total Bilirubin: 0.3 mg/dL (ref 0.3–1.2)
Total Protein: 7.2 g/dL (ref 6.0–8.3)

## 2011-01-17 LAB — IRON AND TIBC
%SAT: 17 % — ABNORMAL LOW (ref 20–55)
Iron: 45 ug/dL (ref 42–145)
TIBC: 262 ug/dL (ref 250–470)
UIBC: 217 ug/dL

## 2011-01-17 LAB — LACTATE DEHYDROGENASE: LDH: 155 U/L (ref 94–250)

## 2011-01-17 LAB — PROTIME-INR
INR: 0.9 — ABNORMAL LOW (ref 2.00–3.50)
Protime: 10.8 Seconds (ref 10.6–13.4)

## 2011-01-17 LAB — FERRITIN: Ferritin: 65 ng/mL (ref 10–291)

## 2011-01-18 LAB — HEPARIN ANTI-XA: Heparin LMW: 1.72 IU/mL

## 2011-01-21 ENCOUNTER — Encounter (HOSPITAL_BASED_OUTPATIENT_CLINIC_OR_DEPARTMENT_OTHER): Payer: Medicaid Other | Admitting: Oncology

## 2011-01-21 ENCOUNTER — Other Ambulatory Visit (HOSPITAL_COMMUNITY): Payer: Self-pay | Admitting: Oncology

## 2011-01-21 DIAGNOSIS — D509 Iron deficiency anemia, unspecified: Secondary | ICD-10-CM

## 2011-01-21 DIAGNOSIS — I2699 Other pulmonary embolism without acute cor pulmonale: Secondary | ICD-10-CM

## 2011-01-21 DIAGNOSIS — Z86718 Personal history of other venous thrombosis and embolism: Secondary | ICD-10-CM

## 2011-01-21 LAB — PROTIME-INR
INR: 1 — ABNORMAL LOW (ref 2.00–3.50)
Protime: 12 Seconds (ref 10.6–13.4)

## 2011-01-24 ENCOUNTER — Other Ambulatory Visit (HOSPITAL_COMMUNITY): Payer: Self-pay | Admitting: Oncology

## 2011-01-24 ENCOUNTER — Encounter (HOSPITAL_BASED_OUTPATIENT_CLINIC_OR_DEPARTMENT_OTHER): Payer: Medicaid Other | Admitting: Oncology

## 2011-01-24 DIAGNOSIS — I2699 Other pulmonary embolism without acute cor pulmonale: Secondary | ICD-10-CM

## 2011-01-24 DIAGNOSIS — D509 Iron deficiency anemia, unspecified: Secondary | ICD-10-CM

## 2011-01-24 DIAGNOSIS — Z86718 Personal history of other venous thrombosis and embolism: Secondary | ICD-10-CM

## 2011-01-24 LAB — PROTIME-INR
INR: 1.8 — ABNORMAL LOW (ref 2.00–3.50)
Protime: 21.6 Seconds — ABNORMAL HIGH (ref 10.6–13.4)

## 2011-01-26 ENCOUNTER — Encounter (HOSPITAL_BASED_OUTPATIENT_CLINIC_OR_DEPARTMENT_OTHER): Payer: Medicaid Other | Admitting: Oncology

## 2011-01-26 ENCOUNTER — Other Ambulatory Visit (HOSPITAL_COMMUNITY): Payer: Self-pay | Admitting: Oncology

## 2011-01-26 DIAGNOSIS — D509 Iron deficiency anemia, unspecified: Secondary | ICD-10-CM

## 2011-01-26 DIAGNOSIS — Z86718 Personal history of other venous thrombosis and embolism: Secondary | ICD-10-CM

## 2011-01-26 DIAGNOSIS — I2699 Other pulmonary embolism without acute cor pulmonale: Secondary | ICD-10-CM

## 2011-01-26 LAB — PROTIME-INR
INR: 2.3 (ref 2.00–3.50)
Protime: 27.6 Seconds — ABNORMAL HIGH (ref 10.6–13.4)

## 2011-01-28 ENCOUNTER — Other Ambulatory Visit (HOSPITAL_COMMUNITY): Payer: Self-pay | Admitting: Oncology

## 2011-01-28 ENCOUNTER — Encounter: Payer: Medicaid Other | Admitting: Oncology

## 2011-01-28 LAB — PROTIME-INR
INR: 2.4 (ref 2.00–3.50)
Protime: 28.8 Seconds — ABNORMAL HIGH (ref 10.6–13.4)

## 2011-02-01 ENCOUNTER — Encounter (HOSPITAL_BASED_OUTPATIENT_CLINIC_OR_DEPARTMENT_OTHER): Payer: Medicaid Other | Admitting: Oncology

## 2011-02-01 ENCOUNTER — Other Ambulatory Visit: Payer: Self-pay | Admitting: Physician Assistant

## 2011-02-01 DIAGNOSIS — I2699 Other pulmonary embolism without acute cor pulmonale: Secondary | ICD-10-CM

## 2011-02-01 DIAGNOSIS — D509 Iron deficiency anemia, unspecified: Secondary | ICD-10-CM

## 2011-02-01 DIAGNOSIS — Z86718 Personal history of other venous thrombosis and embolism: Secondary | ICD-10-CM

## 2011-02-01 LAB — PROTIME-INR
INR: 1.3 — ABNORMAL LOW (ref 2.00–3.50)
Protime: 15.6 Seconds — ABNORMAL HIGH (ref 10.6–13.4)

## 2011-02-04 ENCOUNTER — Other Ambulatory Visit (HOSPITAL_COMMUNITY): Payer: Self-pay | Admitting: Oncology

## 2011-02-04 ENCOUNTER — Encounter (HOSPITAL_BASED_OUTPATIENT_CLINIC_OR_DEPARTMENT_OTHER): Payer: Medicaid Other | Admitting: Oncology

## 2011-02-04 DIAGNOSIS — I2699 Other pulmonary embolism without acute cor pulmonale: Secondary | ICD-10-CM

## 2011-02-04 DIAGNOSIS — Z7901 Long term (current) use of anticoagulants: Secondary | ICD-10-CM

## 2011-02-04 DIAGNOSIS — Z5181 Encounter for therapeutic drug level monitoring: Secondary | ICD-10-CM

## 2011-02-04 LAB — PROTIME-INR
INR: 1.8 — ABNORMAL LOW (ref 2.00–3.50)
Protime: 21.6 Seconds — ABNORMAL HIGH (ref 10.6–13.4)

## 2011-03-22 LAB — URINALYSIS, ROUTINE W REFLEX MICROSCOPIC
Bilirubin Urine: NEGATIVE
Glucose, UA: NEGATIVE
Hgb urine dipstick: NEGATIVE
Ketones, ur: NEGATIVE
Nitrite: NEGATIVE
Protein, ur: NEGATIVE
Specific Gravity, Urine: 1.005
Urobilinogen, UA: 0.2
pH: 6

## 2011-03-22 LAB — WET PREP, GENITAL
Clue Cells Wet Prep HPF POC: NONE SEEN
Trich, Wet Prep: NONE SEEN
Yeast Wet Prep HPF POC: NONE SEEN

## 2011-03-22 LAB — RPR: RPR Ser Ql: NONREACTIVE

## 2011-03-22 LAB — POCT I-STAT, CHEM 8
BUN: 3 — ABNORMAL LOW
Calcium, Ion: 1.29
Chloride: 105
Creatinine, Ser: 0.9
Glucose, Bld: 102 — ABNORMAL HIGH
HCT: 32 — ABNORMAL LOW
Hemoglobin: 10.9 — ABNORMAL LOW
Potassium: 3.6
Sodium: 139
TCO2: 25

## 2011-03-22 LAB — DIFFERENTIAL
Basophils Absolute: 0
Basophils Relative: 0
Eosinophils Absolute: 0.2
Eosinophils Relative: 1
Lymphocytes Relative: 28
Lymphs Abs: 3.1
Monocytes Absolute: 0.8
Monocytes Relative: 7
Neutro Abs: 7
Neutrophils Relative %: 63

## 2011-03-22 LAB — POCT PREGNANCY, URINE: Preg Test, Ur: NEGATIVE

## 2011-03-22 LAB — CBC
HCT: 30 — ABNORMAL LOW
Hemoglobin: 9.7 — ABNORMAL LOW
MCHC: 32.4
MCV: 75.9 — ABNORMAL LOW
Platelets: 523 — ABNORMAL HIGH
RBC: 3.96
RDW: 19.1 — ABNORMAL HIGH
WBC: 11.1 — ABNORMAL HIGH

## 2011-03-22 LAB — GC/CHLAMYDIA PROBE AMP, GENITAL
Chlamydia, DNA Probe: NEGATIVE
GC Probe Amp, Genital: NEGATIVE

## 2011-03-25 ENCOUNTER — Encounter (HOSPITAL_BASED_OUTPATIENT_CLINIC_OR_DEPARTMENT_OTHER): Payer: Medicaid Other | Admitting: Oncology

## 2011-03-25 ENCOUNTER — Other Ambulatory Visit (HOSPITAL_COMMUNITY): Payer: Self-pay | Admitting: Oncology

## 2011-03-25 DIAGNOSIS — I2699 Other pulmonary embolism without acute cor pulmonale: Secondary | ICD-10-CM

## 2011-03-25 DIAGNOSIS — Z86718 Personal history of other venous thrombosis and embolism: Secondary | ICD-10-CM

## 2011-03-25 DIAGNOSIS — Z7901 Long term (current) use of anticoagulants: Secondary | ICD-10-CM

## 2011-03-25 DIAGNOSIS — D509 Iron deficiency anemia, unspecified: Secondary | ICD-10-CM

## 2011-03-25 LAB — CBC WITH DIFFERENTIAL/PLATELET
BASO%: 1.4 % (ref 0.0–2.0)
Basophils Absolute: 0.1 10*3/uL (ref 0.0–0.1)
EOS%: 4.1 % (ref 0.0–7.0)
Eosinophils Absolute: 0.3 10*3/uL (ref 0.0–0.5)
HCT: 39.6 % (ref 34.8–46.6)
HGB: 13 g/dL (ref 11.6–15.9)
LYMPH%: 53.5 % — ABNORMAL HIGH (ref 14.0–49.7)
MCH: 28.2 pg (ref 25.1–34.0)
MCHC: 33 g/dL (ref 31.5–36.0)
MCV: 85.5 fL (ref 79.5–101.0)
MONO#: 0.3 10*3/uL (ref 0.1–0.9)
MONO%: 4.3 % (ref 0.0–14.0)
NEUT#: 2.5 10*3/uL (ref 1.5–6.5)
NEUT%: 36.7 % — ABNORMAL LOW (ref 38.4–76.8)
Platelets: 365 10*3/uL (ref 145–400)
RBC: 4.63 10*6/uL (ref 3.70–5.45)
RDW: 13.9 % (ref 11.2–14.5)
WBC: 6.7 10*3/uL (ref 3.9–10.3)
lymph#: 3.6 10*3/uL — ABNORMAL HIGH (ref 0.9–3.3)

## 2011-03-25 LAB — COMPREHENSIVE METABOLIC PANEL
ALT: 15 U/L (ref 0–35)
AST: 15 U/L (ref 0–37)
Albumin: 4.4 g/dL (ref 3.5–5.2)
Alkaline Phosphatase: 79 U/L (ref 39–117)
BUN: 5 mg/dL — ABNORMAL LOW (ref 6–23)
CO2: 26 mEq/L (ref 19–32)
Calcium: 8.7 mg/dL (ref 8.4–10.5)
Chloride: 104 mEq/L (ref 96–112)
Creatinine, Ser: 0.77 mg/dL (ref 0.50–1.10)
Glucose, Bld: 82 mg/dL (ref 70–99)
Potassium: 3.4 mEq/L — ABNORMAL LOW (ref 3.5–5.3)
Sodium: 141 mEq/L (ref 135–145)
Total Bilirubin: 0.4 mg/dL (ref 0.3–1.2)
Total Protein: 7.6 g/dL (ref 6.0–8.3)

## 2011-03-25 LAB — LACTATE DEHYDROGENASE: LDH: 166 U/L (ref 94–250)

## 2011-03-25 LAB — IRON AND TIBC
%SAT: 29 % (ref 20–55)
Iron: 75 ug/dL (ref 42–145)
TIBC: 255 ug/dL (ref 250–470)
UIBC: 180 ug/dL (ref 125–400)

## 2011-03-25 LAB — PROTIME-INR
INR: 2.9 (ref 2.00–3.50)
Protime: 34.8 Seconds — ABNORMAL HIGH (ref 10.6–13.4)

## 2011-03-25 LAB — FERRITIN: Ferritin: 176 ng/mL (ref 10–291)

## 2011-04-01 ENCOUNTER — Other Ambulatory Visit (HOSPITAL_COMMUNITY): Payer: Self-pay | Admitting: Oncology

## 2011-04-01 ENCOUNTER — Encounter (HOSPITAL_BASED_OUTPATIENT_CLINIC_OR_DEPARTMENT_OTHER): Payer: Medicaid Other | Admitting: Oncology

## 2011-04-01 DIAGNOSIS — I2699 Other pulmonary embolism without acute cor pulmonale: Secondary | ICD-10-CM

## 2011-04-01 DIAGNOSIS — D509 Iron deficiency anemia, unspecified: Secondary | ICD-10-CM

## 2011-04-01 DIAGNOSIS — Z86718 Personal history of other venous thrombosis and embolism: Secondary | ICD-10-CM

## 2011-04-01 LAB — PROTIME-INR
INR: 2.6 (ref 2.00–3.50)
Protime: 31.2 Seconds — ABNORMAL HIGH (ref 10.6–13.4)

## 2011-04-25 ENCOUNTER — Telehealth: Payer: Self-pay | Admitting: Medical Oncology

## 2011-04-25 NOTE — Telephone Encounter (Signed)
I called pt regarding her PT/INR. I stressed she has missed 3 labs and Dr. Arline Asp is not pleased. She states she will be here Friday 04/29/11. She states she has been taking her coumadin. I stressed even though she is taking as ordered her values can get out of normal range and this could be dangerous. She has an appointment 9:30 am 04/29/11

## 2011-04-29 ENCOUNTER — Other Ambulatory Visit (HOSPITAL_BASED_OUTPATIENT_CLINIC_OR_DEPARTMENT_OTHER): Payer: Medicaid Other | Admitting: Lab

## 2011-04-29 ENCOUNTER — Other Ambulatory Visit (HOSPITAL_COMMUNITY): Payer: Self-pay | Admitting: Oncology

## 2011-04-29 ENCOUNTER — Telehealth: Payer: Self-pay | Admitting: Medical Oncology

## 2011-04-29 DIAGNOSIS — Z86718 Personal history of other venous thrombosis and embolism: Secondary | ICD-10-CM

## 2011-04-29 DIAGNOSIS — I2699 Other pulmonary embolism without acute cor pulmonale: Secondary | ICD-10-CM

## 2011-04-29 DIAGNOSIS — D509 Iron deficiency anemia, unspecified: Secondary | ICD-10-CM

## 2011-04-29 LAB — PROTIME-INR
INR: 1.5 — ABNORMAL LOW (ref 2.00–3.50)
Protime: 18 Seconds — ABNORMAL HIGH (ref 10.6–13.4)

## 2011-04-29 NOTE — Telephone Encounter (Signed)
I called pt per Dr. Arline Asp to see if she has missed any doses of coumadin. She states yes a day. I told her to continue the 7.5mg  daily and check her PT/INR on 05/06/11 as scheduled. She voiced understanding.

## 2011-05-06 ENCOUNTER — Ambulatory Visit: Payer: Self-pay

## 2011-05-06 ENCOUNTER — Other Ambulatory Visit (HOSPITAL_BASED_OUTPATIENT_CLINIC_OR_DEPARTMENT_OTHER): Payer: Medicaid Other

## 2011-05-06 ENCOUNTER — Other Ambulatory Visit (HOSPITAL_COMMUNITY): Payer: Self-pay | Admitting: Oncology

## 2011-05-06 DIAGNOSIS — I2699 Other pulmonary embolism without acute cor pulmonale: Secondary | ICD-10-CM

## 2011-05-06 DIAGNOSIS — Z86718 Personal history of other venous thrombosis and embolism: Secondary | ICD-10-CM

## 2011-05-06 DIAGNOSIS — D509 Iron deficiency anemia, unspecified: Secondary | ICD-10-CM

## 2011-05-06 LAB — PROTIME-INR
INR: 1.1 — ABNORMAL LOW (ref 2.00–3.50)
Protime: 13.2 Seconds (ref 10.6–13.4)

## 2011-05-06 LAB — POCT INR: INR: 1.1

## 2011-05-06 NOTE — Patient Instructions (Signed)
Take 10 mg for 2 days then 7.5 mg. Next appt 05/13/11

## 2011-05-13 ENCOUNTER — Other Ambulatory Visit (HOSPITAL_COMMUNITY): Payer: Self-pay | Admitting: Oncology

## 2011-05-13 ENCOUNTER — Telehealth: Payer: Self-pay | Admitting: Medical Oncology

## 2011-05-13 ENCOUNTER — Other Ambulatory Visit (HOSPITAL_BASED_OUTPATIENT_CLINIC_OR_DEPARTMENT_OTHER): Payer: Medicaid Other | Admitting: Lab

## 2011-05-13 DIAGNOSIS — I2699 Other pulmonary embolism without acute cor pulmonale: Secondary | ICD-10-CM

## 2011-05-13 DIAGNOSIS — D509 Iron deficiency anemia, unspecified: Secondary | ICD-10-CM

## 2011-05-13 DIAGNOSIS — Z86718 Personal history of other venous thrombosis and embolism: Secondary | ICD-10-CM

## 2011-05-13 LAB — PROTIME-INR
INR: 1.4 — ABNORMAL LOW (ref 2.00–3.50)
Protime: 16.8 Seconds — ABNORMAL HIGH (ref 10.6–13.4)

## 2011-05-13 NOTE — Telephone Encounter (Signed)
Called pt per Dr. Arline Asp to have her take 10 mg of coumadin today, Sat and Sun. Need PT/INR checked Monday 05/16/11

## 2011-05-13 NOTE — Telephone Encounter (Signed)
I called pt and left a message regarding her PT/INR. I asked her to call to discuss

## 2011-05-16 ENCOUNTER — Other Ambulatory Visit: Payer: Self-pay | Admitting: Medical Oncology

## 2011-05-16 ENCOUNTER — Telehealth: Payer: Self-pay | Admitting: Oncology

## 2011-05-16 ENCOUNTER — Encounter: Payer: Self-pay | Admitting: Medical Oncology

## 2011-05-16 DIAGNOSIS — Z86718 Personal history of other venous thrombosis and embolism: Secondary | ICD-10-CM

## 2011-05-16 NOTE — Progress Notes (Signed)
Called pt on Friday 05/13/11 to have her take 10mg  of coumadin daily and recheck PT/INR 05/16/11

## 2011-05-16 NOTE — Telephone Encounter (Signed)
lmonvm advising the pt of her lab appt for 05/17/2011

## 2011-05-17 ENCOUNTER — Ambulatory Visit (HOSPITAL_BASED_OUTPATIENT_CLINIC_OR_DEPARTMENT_OTHER): Payer: Medicaid Other

## 2011-05-17 ENCOUNTER — Other Ambulatory Visit: Payer: Self-pay | Admitting: Medical Oncology

## 2011-05-17 ENCOUNTER — Telehealth: Payer: Self-pay | Admitting: Medical Oncology

## 2011-05-17 ENCOUNTER — Other Ambulatory Visit: Payer: Medicaid Other | Admitting: Lab

## 2011-05-17 DIAGNOSIS — Z86718 Personal history of other venous thrombosis and embolism: Secondary | ICD-10-CM

## 2011-05-17 LAB — PROTIME-INR
INR: 2 (ref 2.00–3.50)
Protime: 24 Seconds — ABNORMAL HIGH (ref 10.6–13.4)

## 2011-05-17 NOTE — Telephone Encounter (Signed)
I called pt per Dr. Arline Asp to have her continue taking 10 mg of coumadin daily. He would like to check PT/INR 05/23/11. She voiced understanding.

## 2011-05-20 ENCOUNTER — Other Ambulatory Visit: Payer: Medicaid Other | Admitting: Lab

## 2011-05-24 ENCOUNTER — Ambulatory Visit (HOSPITAL_BASED_OUTPATIENT_CLINIC_OR_DEPARTMENT_OTHER): Payer: Medicaid Other

## 2011-05-24 ENCOUNTER — Telehealth: Payer: Self-pay

## 2011-05-24 ENCOUNTER — Other Ambulatory Visit: Payer: Self-pay

## 2011-05-24 DIAGNOSIS — I2699 Other pulmonary embolism without acute cor pulmonale: Secondary | ICD-10-CM

## 2011-05-24 DIAGNOSIS — D509 Iron deficiency anemia, unspecified: Secondary | ICD-10-CM

## 2011-05-24 DIAGNOSIS — Z86718 Personal history of other venous thrombosis and embolism: Secondary | ICD-10-CM

## 2011-05-24 LAB — PROTIME-INR
INR: 3.2 (ref 2.00–3.50)
Protime: 38.4 Seconds — ABNORMAL HIGH (ref 10.6–13.4)

## 2011-05-24 NOTE — Telephone Encounter (Signed)
lvm for pt to continue coumadin 10mg  daily and to follow up PT/INR on 12/10. Schedulers will call her with the time. POF to scheduler

## 2011-05-25 ENCOUNTER — Telehealth: Payer: Self-pay | Admitting: Oncology

## 2011-05-25 NOTE — Telephone Encounter (Signed)
Pt is aware of her lab appt on 05/30/2011

## 2011-05-27 ENCOUNTER — Other Ambulatory Visit: Payer: Medicaid Other | Admitting: Lab

## 2011-05-30 ENCOUNTER — Other Ambulatory Visit (HOSPITAL_BASED_OUTPATIENT_CLINIC_OR_DEPARTMENT_OTHER): Payer: Medicaid Other | Admitting: Lab

## 2011-05-30 ENCOUNTER — Telehealth: Payer: Self-pay | Admitting: Medical Oncology

## 2011-05-30 DIAGNOSIS — D509 Iron deficiency anemia, unspecified: Secondary | ICD-10-CM

## 2011-05-30 LAB — PROTIME-INR
INR: 2.4 (ref 2.00–3.50)
Protime: 28.8 Seconds — ABNORMAL HIGH (ref 10.6–13.4)

## 2011-05-30 NOTE — Telephone Encounter (Signed)
I called pt per Dr. Arline Asp to have her continue 10 mg of coumadin daily. PT/INR 06/03/11. She voiced understanding.

## 2011-06-03 ENCOUNTER — Other Ambulatory Visit: Payer: Self-pay | Admitting: Oncology

## 2011-06-03 ENCOUNTER — Other Ambulatory Visit (HOSPITAL_COMMUNITY): Payer: Self-pay | Admitting: Oncology

## 2011-06-03 ENCOUNTER — Other Ambulatory Visit (HOSPITAL_BASED_OUTPATIENT_CLINIC_OR_DEPARTMENT_OTHER): Payer: Medicaid Other | Admitting: Lab

## 2011-06-03 ENCOUNTER — Ambulatory Visit: Payer: Self-pay | Admitting: Oncology

## 2011-06-03 DIAGNOSIS — I2699 Other pulmonary embolism without acute cor pulmonale: Secondary | ICD-10-CM

## 2011-06-03 DIAGNOSIS — D509 Iron deficiency anemia, unspecified: Secondary | ICD-10-CM

## 2011-06-03 DIAGNOSIS — Z86718 Personal history of other venous thrombosis and embolism: Secondary | ICD-10-CM

## 2011-06-03 LAB — PROTIME-INR
INR: 4.2 — ABNORMAL HIGH (ref 2.00–3.50)
Protime: 50.4 Seconds — ABNORMAL HIGH (ref 10.6–13.4)

## 2011-06-03 LAB — POCT INR: INR: 4.2

## 2011-06-03 NOTE — Progress Notes (Signed)
Consult received from Dr. Arline Asp for patient with history of recurrent bilateral PEs.  Per Dr. Arline Asp, the patient is historically noncompliant with both medications and appointments.  Spoke with patient today to verify dose and offer dose instructions. She already took her Coumadin this morning.  She cannot remember if she took the Coumadin twice yesterday.  She often gets confused about when to take her Coumadin since she always took her Lovenox shots in the AM, but the rest of her medications at night.  Assured patient that if it is easier for her to remember to take in the morning like when she was on Lovenox, then it is OK for her to continue AM dosing of Coumadin. No problems with bleeding or bruising.  INR is supratherapeutic, so we will hold her dose tomorrow, and resume 10mg  daily on Sunday.  Recheck INR in 6 days.

## 2011-06-06 ENCOUNTER — Ambulatory Visit: Payer: Self-pay | Admitting: Oncology

## 2011-06-06 DIAGNOSIS — I2699 Other pulmonary embolism without acute cor pulmonale: Secondary | ICD-10-CM

## 2011-06-06 NOTE — Progress Notes (Signed)
Pt reported to clinic today without an appt.  She thought someone had called her to return for another lab today.  She has taken her Coumadin as directed on Friday: she skipped dose on Saturday, took her dose on Sunday.  Denies problems with bleeding or bruising.  Explained to patient that she needs to return on Thursday, 06/09/11 for another INR, and continue taking Coumadin 10mg  daily.  Spoke briefly about the features of the Coumadin clinic, as she was curious why Dr. Arline Asp just now referred her to [place].  Pt communicated understanding.  She will keep her appt 06/09/11 at 0945 for lab and 1000 for Coumadin Clinic.

## 2011-06-09 ENCOUNTER — Ambulatory Visit (HOSPITAL_BASED_OUTPATIENT_CLINIC_OR_DEPARTMENT_OTHER): Payer: Medicaid Other | Admitting: Oncology

## 2011-06-09 ENCOUNTER — Other Ambulatory Visit (HOSPITAL_BASED_OUTPATIENT_CLINIC_OR_DEPARTMENT_OTHER): Payer: Medicaid Other

## 2011-06-09 ENCOUNTER — Ambulatory Visit: Payer: Medicaid Other

## 2011-06-09 ENCOUNTER — Other Ambulatory Visit (HOSPITAL_COMMUNITY): Payer: Self-pay | Admitting: Oncology

## 2011-06-09 DIAGNOSIS — D509 Iron deficiency anemia, unspecified: Secondary | ICD-10-CM

## 2011-06-09 DIAGNOSIS — I2699 Other pulmonary embolism without acute cor pulmonale: Secondary | ICD-10-CM

## 2011-06-09 DIAGNOSIS — Z86718 Personal history of other venous thrombosis and embolism: Secondary | ICD-10-CM

## 2011-06-09 LAB — POCT INR: INR: 1.3

## 2011-06-09 LAB — PROTIME-INR
INR: 1.3 — ABNORMAL LOW (ref 2.00–3.50)
Protime: 15.6 Seconds — ABNORMAL HIGH (ref 10.6–13.4)

## 2011-06-09 NOTE — Progress Notes (Signed)
Pt stated that she had a lot going on with son, so forgot to take coumadin.  I gave pt a pill box to help remember to take medications.  I told pt that she can take coumadin in the morning if easier to remember.  Compliance seems to be an issue with this pt.  Note: Pt had been stable on Coumadin 7.5mg  daily for some time prior to referral to Coumadin clinic.

## 2011-06-10 ENCOUNTER — Other Ambulatory Visit: Payer: Medicaid Other | Admitting: Lab

## 2011-06-16 ENCOUNTER — Ambulatory Visit: Payer: Medicaid Other

## 2011-06-16 ENCOUNTER — Ambulatory Visit (HOSPITAL_BASED_OUTPATIENT_CLINIC_OR_DEPARTMENT_OTHER): Payer: Self-pay | Admitting: Oncology

## 2011-06-16 ENCOUNTER — Other Ambulatory Visit (HOSPITAL_BASED_OUTPATIENT_CLINIC_OR_DEPARTMENT_OTHER): Payer: Medicaid Other

## 2011-06-16 ENCOUNTER — Other Ambulatory Visit (HOSPITAL_COMMUNITY): Payer: Self-pay | Admitting: Oncology

## 2011-06-16 DIAGNOSIS — Z86718 Personal history of other venous thrombosis and embolism: Secondary | ICD-10-CM

## 2011-06-16 DIAGNOSIS — I2699 Other pulmonary embolism without acute cor pulmonale: Secondary | ICD-10-CM

## 2011-06-16 DIAGNOSIS — D509 Iron deficiency anemia, unspecified: Secondary | ICD-10-CM

## 2011-06-16 LAB — PROTIME-INR
INR: 1.8 — ABNORMAL LOW (ref 2.00–3.50)
Protime: 21.6 Seconds — ABNORMAL HIGH (ref 10.6–13.4)

## 2011-06-16 LAB — POCT INR: INR: 1.8

## 2011-06-16 NOTE — Progress Notes (Signed)
Pt states she slept from 12/25 until 12/27 so she "missed a day."  She dosed her Coumadin at 15 mg on 12/25 then 20 mg at midnight on 12/27. It appears that we need to increase her from 10 mg/day at this time. Re: body "locking up" and R hip pain in the AM; pt wanted to know if this would be from taking Coumadin.  I advised her to d/w her neurologist on 07/01/11 at her appt but that it would be a very unlikely side effect from Coumadin.  Seems more muscular/neurological rather than related to clot.  Pt has no swelling, difficulty breathing or chest pain.  Return in 1 week. Marily Lente, Pharm.D.

## 2011-06-17 ENCOUNTER — Other Ambulatory Visit: Payer: Medicaid Other | Admitting: Lab

## 2011-06-23 ENCOUNTER — Ambulatory Visit: Payer: Medicaid Other

## 2011-06-23 ENCOUNTER — Other Ambulatory Visit: Payer: Medicaid Other | Admitting: Lab

## 2011-06-24 ENCOUNTER — Telehealth: Payer: Self-pay | Admitting: Pharmacist

## 2011-06-27 ENCOUNTER — Telehealth: Payer: Self-pay | Admitting: Oncology

## 2011-06-27 NOTE — Telephone Encounter (Signed)
S/w the pt and he is aware of her md appt

## 2011-06-28 ENCOUNTER — Other Ambulatory Visit: Payer: Medicaid Other | Admitting: Lab

## 2011-06-28 ENCOUNTER — Other Ambulatory Visit (HOSPITAL_COMMUNITY): Payer: Self-pay | Admitting: Oncology

## 2011-06-28 ENCOUNTER — Ambulatory Visit (HOSPITAL_BASED_OUTPATIENT_CLINIC_OR_DEPARTMENT_OTHER): Payer: Medicaid Other | Admitting: Oncology

## 2011-06-28 ENCOUNTER — Ambulatory Visit: Payer: Medicaid Other

## 2011-06-28 ENCOUNTER — Other Ambulatory Visit: Payer: Self-pay | Admitting: Pharmacist

## 2011-06-28 DIAGNOSIS — I2699 Other pulmonary embolism without acute cor pulmonale: Secondary | ICD-10-CM

## 2011-06-28 LAB — PROTHROMBIN TIME
INR: 5.26 (ref <1.50)
Prothrombin Time: 49 seconds — ABNORMAL HIGH (ref 11.6–15.2)

## 2011-06-28 LAB — PROTIME-INR

## 2011-06-28 LAB — POCT INR: INR: 5.26

## 2011-06-28 NOTE — Telephone Encounter (Signed)
Refill for Coumadin called in to her [facility] pharmacy.   Warfarin 5mg  tablets Take 10mg  daily except 15mg  on Monday #60 2RF MD: [place]

## 2011-06-28 NOTE — Progress Notes (Signed)
INR sent out, and confirmed today at 5.26. Pt had deviated from dose this week.  She thinks that she forgot her dose on Friday and Saturday, and took 15mg  on both Sunday and Monday to "make up."  She took 5mg  of her total dose of 10mg  today.  She intends to complete the dose this evening with her other meds.  She apparently splits the dose so she doesn't have to swallow as many pills in the evening.  She understands that it is not necessary to split her doses in this way, but explains that it is easier for her.  No significant changes noted to diet or medications.  No problems with bleeding or bruising.  No complaints. Will hold remainder of dose today, and hold dose tomorrow.  Resume at 10mg  daily except 15mg  on Mondays.  Recheck INR in 6 days.

## 2011-07-04 ENCOUNTER — Ambulatory Visit (HOSPITAL_BASED_OUTPATIENT_CLINIC_OR_DEPARTMENT_OTHER): Payer: Medicaid Other | Admitting: Oncology

## 2011-07-04 ENCOUNTER — Telehealth: Payer: Self-pay | Admitting: Medical Oncology

## 2011-07-04 ENCOUNTER — Other Ambulatory Visit (HOSPITAL_COMMUNITY): Payer: Self-pay | Admitting: Oncology

## 2011-07-04 ENCOUNTER — Ambulatory Visit: Payer: Medicaid Other

## 2011-07-04 ENCOUNTER — Other Ambulatory Visit (HOSPITAL_BASED_OUTPATIENT_CLINIC_OR_DEPARTMENT_OTHER): Payer: Medicaid Other | Admitting: Lab

## 2011-07-04 DIAGNOSIS — D509 Iron deficiency anemia, unspecified: Secondary | ICD-10-CM

## 2011-07-04 DIAGNOSIS — I2699 Other pulmonary embolism without acute cor pulmonale: Secondary | ICD-10-CM

## 2011-07-04 DIAGNOSIS — Z86718 Personal history of other venous thrombosis and embolism: Secondary | ICD-10-CM

## 2011-07-04 LAB — POCT INR: INR: 2.5

## 2011-07-04 LAB — CBC WITH DIFFERENTIAL/PLATELET
BASO%: 1.2 % (ref 0.0–2.0)
Basophils Absolute: 0.1 10*3/uL (ref 0.0–0.1)
EOS%: 1.8 % (ref 0.0–7.0)
Eosinophils Absolute: 0.1 10*3/uL (ref 0.0–0.5)
HCT: 38.8 % (ref 34.8–46.6)
HGB: 13 g/dL (ref 11.6–15.9)
LYMPH%: 24.4 % (ref 14.0–49.7)
MCH: 28.2 pg (ref 25.1–34.0)
MCHC: 33.5 g/dL (ref 31.5–36.0)
MCV: 84.3 fL (ref 79.5–101.0)
MONO#: 0.2 10*3/uL (ref 0.1–0.9)
MONO%: 2.5 % (ref 0.0–14.0)
NEUT#: 4.6 10*3/uL (ref 1.5–6.5)
NEUT%: 70.1 % (ref 38.4–76.8)
Platelets: 360 10*3/uL (ref 145–400)
RBC: 4.61 10*6/uL (ref 3.70–5.45)
RDW: 13.2 % (ref 11.2–14.5)
WBC: 6.5 10*3/uL (ref 3.9–10.3)
lymph#: 1.6 10*3/uL (ref 0.9–3.3)

## 2011-07-04 LAB — COMPREHENSIVE METABOLIC PANEL
ALT: 10 U/L (ref 0–35)
AST: 12 U/L (ref 0–37)
Albumin: 4.4 g/dL (ref 3.5–5.2)
Alkaline Phosphatase: 74 U/L (ref 39–117)
BUN: 8 mg/dL (ref 6–23)
CO2: 19 mEq/L (ref 19–32)
Calcium: 9 mg/dL (ref 8.4–10.5)
Chloride: 107 mEq/L (ref 96–112)
Creatinine, Ser: 0.72 mg/dL (ref 0.50–1.10)
Glucose, Bld: 123 mg/dL — ABNORMAL HIGH (ref 70–99)
Potassium: 3.6 mEq/L (ref 3.5–5.3)
Sodium: 137 mEq/L (ref 135–145)
Total Bilirubin: 0.4 mg/dL (ref 0.3–1.2)
Total Protein: 7.5 g/dL (ref 6.0–8.3)

## 2011-07-04 LAB — IRON AND TIBC
%SAT: 38 % (ref 20–55)
Iron: 94 ug/dL (ref 42–145)
TIBC: 250 ug/dL (ref 250–470)
UIBC: 156 ug/dL (ref 125–400)

## 2011-07-04 LAB — LACTATE DEHYDROGENASE: LDH: 160 U/L (ref 94–250)

## 2011-07-04 LAB — PROTIME-INR
INR: 2.5 (ref 2.00–3.50)
Protime: 30 Seconds — ABNORMAL HIGH (ref 10.6–13.4)

## 2011-07-04 LAB — FERRITIN: Ferritin: 234 ng/mL (ref 10–291)

## 2011-07-04 NOTE — Patient Instructions (Signed)
Continue 10mg  daily except 15mg  on Mondays. Recheck INR in 10 days.

## 2011-07-04 NOTE — Progress Notes (Signed)
Continue 10mg  daily except 15mg  on Mondays.  Recheck INR in 10 days on 07/13/11.

## 2011-07-05 ENCOUNTER — Other Ambulatory Visit (HOSPITAL_COMMUNITY): Payer: Self-pay | Admitting: Oncology

## 2011-07-05 DIAGNOSIS — D509 Iron deficiency anemia, unspecified: Secondary | ICD-10-CM

## 2011-07-13 ENCOUNTER — Ambulatory Visit: Payer: Medicaid Other

## 2011-07-13 ENCOUNTER — Telehealth: Payer: Self-pay | Admitting: Pharmacist

## 2011-07-13 ENCOUNTER — Other Ambulatory Visit: Payer: Medicaid Other | Admitting: Lab

## 2011-07-13 NOTE — Telephone Encounter (Signed)
Spoke to pt.  Missed Coumadin clinic appt today.  Has rescheduled for tomorrow 07/14/11.

## 2011-07-14 ENCOUNTER — Emergency Department (HOSPITAL_COMMUNITY): Payer: Medicaid Other

## 2011-07-14 ENCOUNTER — Ambulatory Visit: Payer: Medicaid Other

## 2011-07-14 ENCOUNTER — Other Ambulatory Visit (HOSPITAL_BASED_OUTPATIENT_CLINIC_OR_DEPARTMENT_OTHER): Payer: Medicaid Other | Admitting: Lab

## 2011-07-14 ENCOUNTER — Other Ambulatory Visit: Payer: Self-pay

## 2011-07-14 ENCOUNTER — Encounter (HOSPITAL_COMMUNITY): Payer: Self-pay | Admitting: Emergency Medicine

## 2011-07-14 ENCOUNTER — Emergency Department (HOSPITAL_COMMUNITY)
Admission: EM | Admit: 2011-07-14 | Discharge: 2011-07-14 | Disposition: A | Discharge status: Home / Self Care | Payer: Medicaid Other | Attending: Emergency Medicine | Admitting: Emergency Medicine

## 2011-07-14 DIAGNOSIS — Z5181 Encounter for therapeutic drug level monitoring: Secondary | ICD-10-CM

## 2011-07-14 DIAGNOSIS — I2699 Other pulmonary embolism without acute cor pulmonale: Secondary | ICD-10-CM

## 2011-07-14 DIAGNOSIS — R079 Chest pain, unspecified: Secondary | ICD-10-CM | POA: Insufficient documentation

## 2011-07-14 DIAGNOSIS — Z7901 Long term (current) use of anticoagulants: Secondary | ICD-10-CM | POA: Insufficient documentation

## 2011-07-14 DIAGNOSIS — M25559 Pain in unspecified hip: Secondary | ICD-10-CM | POA: Insufficient documentation

## 2011-07-14 DIAGNOSIS — R5381 Other malaise: Secondary | ICD-10-CM | POA: Insufficient documentation

## 2011-07-14 DIAGNOSIS — R29898 Other symptoms and signs involving the musculoskeletal system: Secondary | ICD-10-CM | POA: Insufficient documentation

## 2011-07-14 DIAGNOSIS — R0602 Shortness of breath: Secondary | ICD-10-CM | POA: Insufficient documentation

## 2011-07-14 DIAGNOSIS — Z86718 Personal history of other venous thrombosis and embolism: Secondary | ICD-10-CM | POA: Insufficient documentation

## 2011-07-14 HISTORY — DX: Acute embolism and thrombosis of unspecified deep veins of unspecified lower extremity: I82.409

## 2011-07-14 LAB — DIFFERENTIAL
Basophils Absolute: 0 10*3/uL (ref 0.0–0.1)
Basophils Relative: 1 % (ref 0–1)
Eosinophils Absolute: 0.7 10*3/uL (ref 0.0–0.7)
Eosinophils Relative: 11 % — ABNORMAL HIGH (ref 0–5)
Lymphocytes Relative: 36 % (ref 12–46)
Lymphs Abs: 2.4 10*3/uL (ref 0.7–4.0)
Monocytes Absolute: 0.4 10*3/uL (ref 0.1–1.0)
Monocytes Relative: 6 % (ref 3–12)
Neutro Abs: 3 10*3/uL (ref 1.7–7.7)
Neutrophils Relative %: 46 % (ref 43–77)

## 2011-07-14 LAB — POCT I-STAT, CHEM 8
BUN: 8 mg/dL (ref 6–23)
Calcium, Ion: 1.27 mmol/L (ref 1.12–1.32)
Chloride: 109 mEq/L (ref 96–112)
Creatinine, Ser: 0.8 mg/dL (ref 0.50–1.10)
Glucose, Bld: 82 mg/dL (ref 70–99)
HCT: 39 % (ref 36.0–46.0)
Hemoglobin: 13.3 g/dL (ref 12.0–15.0)
Potassium: 3.6 mEq/L (ref 3.5–5.1)
Sodium: 145 mEq/L (ref 135–145)
TCO2: 24 mmol/L (ref 0–100)

## 2011-07-14 LAB — RAPID URINE DRUG SCREEN, HOSP PERFORMED
Amphetamines: NOT DETECTED
Barbiturates: NOT DETECTED
Benzodiazepines: POSITIVE — AB
Cocaine: NOT DETECTED
Opiates: POSITIVE — AB
Tetrahydrocannabinol: NOT DETECTED

## 2011-07-14 LAB — PROTIME-INR
INR: 3.8 — ABNORMAL HIGH (ref 2.00–3.50)
INR: 3.37 — ABNORMAL HIGH (ref 0.00–1.49)
Prothrombin Time: 34.6 seconds — ABNORMAL HIGH (ref 11.6–15.2)
Protime: 45.6 Seconds — ABNORMAL HIGH (ref 10.6–13.4)

## 2011-07-14 LAB — CBC
HCT: 37 % (ref 36.0–46.0)
Hemoglobin: 12.1 g/dL (ref 12.0–15.0)
MCH: 27.8 pg (ref 26.0–34.0)
MCHC: 32.7 g/dL (ref 30.0–36.0)
MCV: 84.9 fL (ref 78.0–100.0)
Platelets: 313 10*3/uL (ref 150–400)
RBC: 4.36 MIL/uL (ref 3.87–5.11)
RDW: 14.7 % (ref 11.5–15.5)
WBC: 6.5 10*3/uL (ref 4.0–10.5)

## 2011-07-14 LAB — POCT I-STAT TROPONIN I: Troponin i, poc: 0 ng/mL (ref 0.00–0.08)

## 2011-07-14 LAB — POCT INR: INR: 3.8

## 2011-07-14 MED ORDER — IOHEXOL 300 MG/ML  SOLN
100.0000 mL | Freq: Once | INTRAMUSCULAR | Status: AC | PRN
  Administered 2011-07-14: 100 mL via INTRAVENOUS

## 2011-07-14 MED ORDER — SODIUM CHLORIDE 0.9 % IV BOLUS (SEPSIS)
1000.0000 mL | Freq: Once | INTRAVENOUS | Status: AC
  Administered 2011-07-14: 1000 mL via INTRAVENOUS

## 2011-07-14 NOTE — ED Provider Notes (Signed)
History     CSN: [id number]  Arrival date & time 07/14/11  1021   First MD Initiated Contact with Patient 07/14/11 1040      Chief Complaint  Patient presents with  . Chest Pain    (Consider location/radiation/quality/duration/timing/severity/associated sxs/prior treatment) Patient is a 46 y.o. female presenting with chest pain. The history is provided by the patient. No language interpreter was used.  Chest Pain The chest pain began 5 - 7 days ago. Chest pain occurs frequently. The chest pain is unchanged. The pain is associated with breathing and exertion. At its most intense, the pain is at 7/10. The pain is currently at 0/10. The severity of the pain is moderate. The quality of the pain is described as aching, heavy and pressure-like. The pain does not radiate. Chest pain is worsened by certain positions, deep breathing and exertion. Primary symptoms include shortness of breath. Pertinent negatives for primary symptoms include no fever, no fatigue, no syncope, no cough, no wheezing, no palpitations, no abdominal pain, no nausea, no vomiting, no dizziness and no altered mental status.  Pertinent negatives for associated symptoms include no claudication, no diaphoresis, no lower extremity edema, no near-syncope, no numbness, no paroxysmal nocturnal dyspnea and no weakness. She tried nothing for the symptoms. Risk factors include obesity and smoking/tobacco exposure (pmh dvt and PE).  Her past medical history is significant for DVT and PE.  Pertinent negatives for past medical history include no diabetes, no MI and no PVD.  Procedure history is negative for cardiac catheterization, echocardiogram, persantine thallium, stress echo, stress thallium and exercise treadmill test.    46 year old female presents from the cancer center where she was getting her INR checked this morning with shortness of breath and right chest pain. The pain is worse with deep breath and exertion. 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 PE in August was on the right side. We will proceed with the angio CT to rule out PE today.. The shortness of breath has been x1 week. Denies calf pain today. States that she did miss a dose of her Coumadin a week ago. She is tachycardic she does not appear short of breath at rest today. Patient is seen by oncology for abnormal clotting studies.  Past Medical History  Diagnosis Date  . DVT (deep venous thrombosis)     Past Surgical History  Procedure Date  . Cesarean section     History reviewed. No pertinent family history.  History  Substance Use Topics  . Smoking status: Not on file  . Smokeless tobacco: Not on file  . Alcohol Use: No    OB History    Grav Para Term Preterm Abortions TAB SAB Ect Mult Living                  Review of Systems  Constitutional: Negative for fever, diaphoresis and fatigue.  Respiratory: Positive for shortness of breath. Negative for cough and wheezing.   Cardiovascular: Positive for chest pain. Negative for palpitations, claudication, syncope and near-syncope.  Gastrointestinal: Negative for nausea, vomiting and abdominal pain.  Neurological: Negative for dizziness, weakness and numbness.  Psychiatric/Behavioral: Negative for altered mental status.  All other systems reviewed and are negative.    Allergies  Review of patient's allergies indicates no known allergies.  Home Medications   Current Outpatient Rx  Name Route Sig Dispense Refill  . FLUOXETINE HCL 10 MG PO CAPS Oral Take 10 mg by mouth daily.      Marland Kitchen  LORAZEPAM 2 MG PO TABS Oral Take 4 mg by mouth at bedtime.      . METHOCARBAMOL 750 MG PO TABS Oral Take 750 mg by mouth 3 (three) times daily as needed. For pain    . MORPHINE SULFATE ER 60 MG PO TB12 Oral Take 120 mg by mouth 2 (two) times daily.      Marland Kitchen NORTRIPTYLINE HCL 50 MG PO CAPS Oral Take 150 mg by mouth at bedtime.      . OXYCODONE HCL 15 MG PO TABS Oral Take 15 mg by mouth every 6 (six)  hours as needed.      . TOPIRAMATE 100 MG PO TABS Oral Take 300 mg by mouth daily. Take 100mg  in AM, 200mg  in PM     . WARFARIN SODIUM 10 MG PO TABS Oral Take 10 mg by mouth daily. Except 15mg  on Mondays    . WARFARIN SODIUM 5 MG PO TABS  TAKE TWO TABS AFTER SUPPER EXCEPT Monday TAKE THREE TABS OR AS DIRECTED BY PHYSICIAN. 60 tablet 0    BP 126/97  Pulse 103  Temp(Src) 97.8 F (36.6 C) (Oral)  Resp 18  SpO2 100%  Physical Exam  Nursing note and vitals reviewed. Constitutional: She is oriented to person, place, and time. She appears well-developed and well-nourished.  HENT:  Head: Normocephalic and atraumatic.  Eyes: Conjunctivae and EOM are normal. Pupils are equal, round, and reactive to light.  Neck: Normal range of motion. Neck supple.  Cardiovascular: Normal rate, regular rhythm, normal heart sounds and intact distal pulses.  Exam reveals no gallop and no friction rub.   No murmur heard. Pulmonary/Chest: Effort normal and breath sounds normal.  Abdominal: Soft. Bowel sounds are normal.  Musculoskeletal: Normal range of motion. She exhibits no edema and no tenderness.       No calf pain  Neurological: She is alert and oriented to person, place, and time. She has normal reflexes.  Skin: Skin is warm and dry.  Psychiatric: She has a normal mood and affect.    ED Course  Procedures (including critical care time)   Labs Reviewed  CBC  DIFFERENTIAL  I-STAT, CHEM 8  PROTIME-INR  I-STAT TROPONIN I   No results found.   No diagnosis found.    MDM   Coming from cancer center with c/o SOB x 1 week and R chest pain especially with deep breath.  Treated with coumadin for dvt's and PE in August.    1:45 pmCT angio shows no blood clot but possible pneumonia.   INR 3.37.  Patient slightly lethargic.  Dr. Ranae Palms in to see patient.  Will get urine drug screen and CT head per Dr. Ranae Palms. INR 3.37.  ? Bleed.   1430: CT head negative.  Drug screen with opiates and  benzo's.  Will Call cancer center and get another inr tomorrow prior to next dose.  Follow up with oncology or Dr. Susann Givens this week or asap for chest x-ray to r/o pneumonia.               Jethro Bastos, NP 07/16/11 [phone]

## 2011-07-14 NOTE — ED Notes (Addendum)
Pt c/o R sided pain under R arm/ rib pain radiates to R breast area. Pt also states bilateral thigh pain. Onset Thursday denies trauma

## 2011-07-14 NOTE — ED Notes (Signed)
MD at bedside. 

## 2011-07-14 NOTE — Progress Notes (Unsigned)
Pt seen in the chairs outside the [facility] lobby elevators.  She was in such extreme chest pain and out of breath that she could not make it all the way down the hall to the Coumadin Clinic.  She was very tearful, and could only talk in a whisper.  Her symptoms started 1 week ago when she was reaching for a towel while getting out of the shower.  She immediately dropped her arms back down, but the pain did not improve.  Breathing deeply and bending over increases her pain.  She cannot describe any factors that decrease her pain.  She denies any abnormal swelling in her extremities that would suggest a clot, but her thighs hurt since she has fallen multiple times over the last few weeks.  Twice she tripped over her children while "walking backwards."  She says that she is not aware of when she is "walking backwards," but does not see what is behind her, and trips and falls often.  She cried while telling me that she hurt both her son and daughter by tripping over them in this way.  She also fell over a lamp cord.  She denies any missed doses of Coumadin, and has had no changes in her meds or diet.  Spoke with Dr. Arline Asp and Garald Braver, RN.  Per Dr. Mamie Levers instructions, we immediately sent her to the Emergency Department to evaluate the chest pain.  The Coumadin Clinic will follow up with the patient after her chest pain is evaluated.

## 2011-07-14 NOTE — ED Notes (Signed)
1000 NS added to IV 

## 2011-07-15 ENCOUNTER — Telehealth: Payer: Self-pay | Admitting: Pharmacist

## 2011-07-15 NOTE — Telephone Encounter (Signed)
Called to f/u with patient after she was sent to the ER yesterday for SOB and chest pain.  She was negative for PE, and her CT head was negative as well.  Her CT chest only seemed possibly suspicious for pneumonia, but it could be d/t the expiratory phase the CT was taken in.  She was not started on any antibiotics.  She was discharged home without any real explanation for her symptoms.  Instructed pt to hold one dose of Coumadin tonight since she did take her dose last night, and we will recheck her INR in 1 week with her next PA appointment here at the cancer center.

## 2011-07-18 NOTE — ED Provider Notes (Signed)
Medical screening examination/treatment/procedure(s) were conducted as a shared visit with non-physician practitioner(s) and myself.  I personally evaluated the patient during the encounter  Loren Racer, MD 07/18/11 [phone]

## 2011-07-21 ENCOUNTER — Ambulatory Visit: Payer: Medicaid Other | Admitting: Pharmacist

## 2011-07-21 ENCOUNTER — Other Ambulatory Visit (HOSPITAL_BASED_OUTPATIENT_CLINIC_OR_DEPARTMENT_OTHER): Payer: Medicaid Other | Admitting: Lab

## 2011-07-21 ENCOUNTER — Ambulatory Visit (HOSPITAL_BASED_OUTPATIENT_CLINIC_OR_DEPARTMENT_OTHER): Payer: Medicaid Other | Admitting: Physician Assistant

## 2011-07-21 ENCOUNTER — Telehealth: Payer: Self-pay | Admitting: Oncology

## 2011-07-21 DIAGNOSIS — I2699 Other pulmonary embolism without acute cor pulmonale: Secondary | ICD-10-CM

## 2011-07-21 DIAGNOSIS — D649 Anemia, unspecified: Secondary | ICD-10-CM

## 2011-07-21 DIAGNOSIS — M79609 Pain in unspecified limb: Secondary | ICD-10-CM

## 2011-07-21 DIAGNOSIS — Z7901 Long term (current) use of anticoagulants: Secondary | ICD-10-CM

## 2011-07-21 LAB — PROTIME-INR
INR: 4.4 — ABNORMAL HIGH (ref 2.00–3.50)
Protime: 52.8 Seconds — ABNORMAL HIGH (ref 10.6–13.4)

## 2011-07-21 LAB — POCT INR: INR: 4.4

## 2011-07-21 NOTE — Telephone Encounter (Signed)
gv pt appt schedule for may.  °

## 2011-07-21 NOTE — Progress Notes (Signed)
Will hold tomorrows 10 mg dose since patient already took todays dose.  Will hold one day and decrease to 10 mg daily with a recheck in one week. 

## 2011-07-21 NOTE — Progress Notes (Signed)
[name] OFFICE PROGRESS NOTE    CC: Maria [name], M.D.  Maria March. Tenny Craw, MD  Maria Jacks, MD   INTERIM HISTORY:  Maria [name] returns to clinic for followup of her recurrent bilateral pulmonary emboli as well as history of anemia.  Since her last clinic visit on January 17, 2011, she was taken off her Lovenox injections when her INR became therapeutic on Coumadin. In the interim, pt was evaluated at the ED with SOB and pleuritic chest pain. CT angio on 07/14/2011 was negative for PE, but suspicious for pneumonia, vs. Positional image. She was not started on antibiotics and discharged. She was instructed to hold Coumadin that evening. Her  PT/INR  Is  52.8/4/4 respectively.  She is currently on Coumadin 10 mg p.o. Daily except Mondays when she takes 15 mg. Currently, she reports some mild fatigue but is able to complete ADLs.  She has had no fevers, chills or night sweats.  No dyspnea or cough.  She reports normal appetite and has had no problems with nausea, vomiting, constipation or diarrhea.  No dysuria, frequency or hematuria.  No alteration in sensation or balance or swelling of extremities. She does report bilateral lower extremity pain, anteriorily when sitting and posteriorily in calves and knees when standing. Pain is present at rest as well. Of note, this pain is recurrent. One year ago, she experienced similar symptoms and sought neurology evaluation. Gabapentin was given but she developed angioedema for which the medicine had to be discontinued. She is due to see Maria [name] in about 2 months on a follow up.   She has had no issues with bruising or bleeding  Current medications are reviewed and recorded.   MEDICAL HISTORY: Past Medical History  Diagnosis Date  . DVT (deep venous thrombosis)     SURGICAL HISTORY:  Past Surgical History  Procedure Date  . Cesarean section     MEDICATIONS: Current Outpatient Prescriptions  Medication Sig Dispense Refill  .  FLUoxetine (PROZAC) 10 MG capsule Take 10 mg by mouth daily.        Marland Kitchen LORazepam (ATIVAN) 2 MG tablet Take 4 mg by mouth at bedtime.        . methocarbamol (ROBAXIN) 750 MG tablet Take 750 mg by mouth 3 (three) times daily as needed. For pain      . morphine (MS CONTIN) 60 MG 12 hr tablet Take 120 mg by mouth 2 (two) times daily.        . nortriptyline (PAMELOR) 50 MG capsule Take 150 mg by mouth at bedtime.        Marland Kitchen oxyCODONE (ROXICODONE) 15 MG immediate release tablet Take 15 mg by mouth every 6 (six) hours as needed.        . topiramate (TOPAMAX) 100 MG tablet Take 300 mg by mouth daily. Take 100mg  in AM, 200mg  in PM       . warfarin (COUMADIN) 10 MG tablet Take 10 mg by mouth daily. Except 15mg  on Mondays      . warfarin (COUMADIN) 5 MG tablet TAKE TWO TABS AFTER SUPPER EXCEPT Monday TAKE THREE TABS OR AS DIRECTED BY PHYSICIAN.  60 tablet  0    ALLERGIES:  Gabapentin (angioedema)  REVIEW OF SYSTEMS:  The rest of the 14-point review of system was negative.   Filed Vitals:   07/21/11 0906  BP: 116/83  Pulse: 96  Temp: 98.7 F (37.1 C)   Wt Readings from Last 3 Encounters:  07/21/11 185  lb (83.915 kg)   ECOG Performance status:   PHYSICAL EXAMINATION:   Vital Signs:  Temperature today is 98.7, heart rate 96, respirations 20, blood pressure 116/83, weight 185 pounds.  General:  This is a well-developed, well-nourished white female in no acute distress.  HEENT:  Sclerae nonicteric.  There is no oral thrush, mucositis.  Skin:  No rashes or lesions.  Lymph:  No cervical, supraclavicular, axillary or inguinal lymphadenopathy.  Cardiac:  Regular rate and rhythm without murmurs or gallops.  Peripheral pulses are 2+.  Chest:  Lungs clear to auscultation.  Abdomen:  Positive bowel sounds, soft, nontender, nondistended.  No organomegaly.  Extremities:  Without edema or cyanosis. Pt reports  Tenderness bilaterally in calfs, knees and anterior thighs in equal intensity..  Neurologic:  Alert and  oriented x3.  Strength, sensation and coordination all grossly intact.    LABORATORY/RADIOLOGY DATA:   Lab 07/14/11 1143 07/14/11 1122  WBC -- 6.5  HGB 13.3 12.1  HCT 39.0 37.0  PLT -- 313  MCV -- 84.9  MCH -- 27.8  MCHC -- 32.7  RDW -- 14.7  LYMPHSABS -- 2.4  MONOABS -- 0.4  EOSABS -- 0.7  BASOSABS -- 0.0  BANDABS -- --    CMP    Lab 07/14/11 1143  NA 145  K 3.6  CL 109  CO2 --  GLUCOSE 82  BUN 8  CREATININE 0.80  CALCIUM --  MG --  AST --  ALT --  ALKPHOS --  BILITOT --        Component Value Date/Time   BILITOT 0.4 07/04/2011 0900   BILITOT 0.4 07/04/2011 0900   BILIDIR 0.1 02/04/2010 1946   IBILI 0.7 02/04/2010 1946      Radiology Studies:  Ct Head Wo Contrast  07/14/2011  *RADIOLOGY REPORT*  Clinical Data: Right hip pain and leg weakness.  CT HEAD WITHOUT CONTRAST  Technique:  Contiguous axial images were obtained from the base of the skull through the vertex without contrast.  Comparison: None.  Findings: No acute intracranial abnormality is present. Specifically, there is no evidence for acute infarct, hemorrhage, mass, hydrocephalus, or extra-axial fluid collection.  The paranasal sinuses and mastoid air cells are clear.  The globes and orbits are intact.  The osseous skull is intact.  There is a focal area of hypoattenuation just to the left of midline which is continued throughout the exam and is likely related to a detector artifact.  IMPRESSION: Negative CT of the head.  Original Report Authenticated By: Jamesetta Orleans. MATTERN, M.D.   Ct Angio Chest W/cm &/or Wo Cm  07/14/2011  *RADIOLOGY REPORT*  Clinical Data: Right chest pain, cough, shortness of breath and weight loss.  CT ANGIOGRAPHY CHEST  Technique:  Multidetector CT imaging of the chest using the standard protocol during bolus administration of intravenous contrast. Multiplanar reconstructed images including MIPs were obtained and reviewed to evaluate the vascular anatomy.  Contrast:  OMNIPAQUE IOHEXOL 300 MG/ML IV SOLN  Comparison: Chest radiograph 11/09/2010 and CT chest 10/20/2009.  Findings: No pulmonary embolus.  No pathologically enlarged mediastinal, hilar or axillary lymph nodes.  Heart size normal.  No pericardial effusion.  There is mild ground-glass air space disease and linear volume loss in both lower lobes.  No pleural fluid.  Airway is unremarkable.  Incidental imaging of the upper abdomen shows no acute findings. No worrisome lytic or sclerotic lesions.  IMPRESSION:  1.  No pulmonary embolus. 2.  Bilateral dependent ground-glass air space disease  is likely due to expiratory phase imaging.  Pneumonia cannot be excluded.  Original Report Authenticated By: Reyes Ivan, M.D.       ASSESSMENT AND PLAN:   1. Ms. Jadah Bobak is a 46 year old white female with a history of recurrent bilateral pulmonary emboli.  She was previously anticoagulated with Lovenox but has been bridged to Coumadin, and her INR today is supratherapeutic at 4.4.  This is on 10mg  p.o. Daily and 15 mg on Mondays. Coumadin clinic has adjusted dose. Coumadin will be held tonight and will resume on 2/1 at 10 mg a day. Will plan to monitor PT/INR every 2 weeks with dose adjustments based on lab values. 2. Lower extremity pain: Unlikely related to emboli. INR is supratherapeutic and patient has been compliant with Coumadin doses. Dr. Arline Asp has recommended for the patient to call Dr. Lewis Moccasin office to schedule an earlier appointment to evaluate neurological etiology of symptoms. 3. Patient has a history of low ferritin level. Her latest values show Iron of 94, percentage sat 38. Last Ferritin on 1/14 was 234.Her H/H 13.3 and 39. Pt is not on Iron pills. Will continue to monitor.  Her LDH is 160.The patient will be scheduled for followup with Dr. Arline Asp in January 2012 at which time we will reassess CBC with differential, comprehensive metabolic panel, LDH, ferritin, iron IBC as well as PT/INR.  She  is instructed to call in the interim if any questions or problems.

## 2011-07-21 NOTE — Patient Instructions (Signed)
Will hold tomorrows 10 mg dose since patient already took todays dose.  Will hold one day and decrease to 10 mg daily with a recheck in one week.

## 2011-07-27 ENCOUNTER — Ambulatory Visit: Payer: Medicaid Other

## 2011-07-27 ENCOUNTER — Other Ambulatory Visit: Payer: Medicaid Other | Admitting: Lab

## 2011-08-01 ENCOUNTER — Ambulatory Visit (HOSPITAL_BASED_OUTPATIENT_CLINIC_OR_DEPARTMENT_OTHER): Payer: Medicaid Other | Admitting: Pharmacist

## 2011-08-01 ENCOUNTER — Other Ambulatory Visit: Payer: Medicaid Other | Admitting: Lab

## 2011-08-01 DIAGNOSIS — Z5181 Encounter for therapeutic drug level monitoring: Secondary | ICD-10-CM

## 2011-08-01 DIAGNOSIS — I2699 Other pulmonary embolism without acute cor pulmonale: Secondary | ICD-10-CM

## 2011-08-01 DIAGNOSIS — Z7901 Long term (current) use of anticoagulants: Secondary | ICD-10-CM

## 2011-08-01 LAB — POCT INR: INR: 1.7

## 2011-08-01 LAB — PROTIME-INR
INR: 1.7 — ABNORMAL LOW (ref 2.00–3.50)
Protime: 20.4 Seconds — ABNORMAL HIGH (ref 10.6–13.4)

## 2011-08-01 NOTE — Progress Notes (Signed)
Will continue current coumadin dose.  INR probably low because of missed doses this weekend.  Will plan to check PT/INR in 2 weeks to ensure INR at goal on current dose of 10mg  daily.  Also, to evaluate any medication changes made by neurologist this week.

## 2011-08-16 ENCOUNTER — Other Ambulatory Visit (HOSPITAL_COMMUNITY): Payer: Self-pay | Admitting: Oncology

## 2011-08-16 ENCOUNTER — Ambulatory Visit: Payer: Medicaid Other

## 2011-08-16 ENCOUNTER — Other Ambulatory Visit: Payer: Medicaid Other

## 2011-08-16 ENCOUNTER — Telehealth: Payer: Self-pay | Admitting: Pharmacist

## 2011-08-17 ENCOUNTER — Other Ambulatory Visit (HOSPITAL_COMMUNITY): Payer: Self-pay | Admitting: Oncology

## 2011-08-17 ENCOUNTER — Other Ambulatory Visit: Payer: Self-pay

## 2011-08-17 DIAGNOSIS — I2699 Other pulmonary embolism without acute cor pulmonale: Secondary | ICD-10-CM

## 2011-08-17 MED ORDER — WARFARIN SODIUM 5 MG PO TABS: 10.0000 mg | ORAL_TABLET | Freq: Every day | ORAL | Status: DC

## 2011-08-18 ENCOUNTER — Ambulatory Visit (HOSPITAL_BASED_OUTPATIENT_CLINIC_OR_DEPARTMENT_OTHER): Payer: Medicaid Other | Admitting: Pharmacist

## 2011-08-18 ENCOUNTER — Other Ambulatory Visit: Payer: Medicaid Other | Admitting: Lab

## 2011-08-18 DIAGNOSIS — I2699 Other pulmonary embolism without acute cor pulmonale: Secondary | ICD-10-CM

## 2011-08-18 LAB — PROTIME-INR
INR: 2.4 (ref 2.00–3.50)
Protime: 28.8 Seconds — ABNORMAL HIGH (ref 10.6–13.4)

## 2011-08-18 LAB — POCT INR: INR: 2.4

## 2011-08-18 NOTE — Patient Instructions (Signed)
Continue same dose of 10 mg daily.  Recheck INR in 2 weeks

## 2011-08-18 NOTE — Progress Notes (Signed)
Continue same dose of 10 mg daily. Recheck INR in 2 weeks. Oxycodone incr to 30 mg and baclofen 10 mg QID added with last MD visit.

## 2011-08-30 ENCOUNTER — Other Ambulatory Visit: Payer: Self-pay | Admitting: Pharmacist

## 2011-08-30 DIAGNOSIS — I2699 Other pulmonary embolism without acute cor pulmonale: Secondary | ICD-10-CM

## 2011-08-31 ENCOUNTER — Telehealth: Payer: Self-pay | Admitting: Pharmacist

## 2011-09-01 ENCOUNTER — Ambulatory Visit: Payer: Medicaid Other

## 2011-09-01 ENCOUNTER — Other Ambulatory Visit: Payer: Medicaid Other | Admitting: Lab

## 2011-09-05 ENCOUNTER — Ambulatory Visit: Payer: Medicaid Other

## 2011-09-05 ENCOUNTER — Other Ambulatory Visit: Payer: Medicaid Other | Admitting: Lab

## 2011-09-06 ENCOUNTER — Ambulatory Visit (HOSPITAL_BASED_OUTPATIENT_CLINIC_OR_DEPARTMENT_OTHER): Payer: Medicaid Other | Admitting: Pharmacist

## 2011-09-06 ENCOUNTER — Other Ambulatory Visit (HOSPITAL_BASED_OUTPATIENT_CLINIC_OR_DEPARTMENT_OTHER): Payer: Medicaid Other

## 2011-09-06 DIAGNOSIS — I2699 Other pulmonary embolism without acute cor pulmonale: Secondary | ICD-10-CM

## 2011-09-06 LAB — PROTIME-INR
INR: 2.2 (ref 2.00–3.50)
Protime: 26.4 Seconds — ABNORMAL HIGH (ref 10.6–13.4)

## 2011-09-06 LAB — POCT INR: INR: 2.2

## 2011-09-06 NOTE — Progress Notes (Signed)
INR therapeutic on 10 mg/day. No major changes or complaints today. No change to Coumadin dose necessary. Return in 1 month. Marily Lente, Pharm.D.

## 2011-09-27 ENCOUNTER — Other Ambulatory Visit (HOSPITAL_COMMUNITY): Payer: Self-pay | Admitting: Oncology

## 2011-09-27 ENCOUNTER — Other Ambulatory Visit: Payer: Self-pay | Admitting: Pharmacist

## 2011-09-27 DIAGNOSIS — I2699 Other pulmonary embolism without acute cor pulmonale: Secondary | ICD-10-CM

## 2011-09-27 MED ORDER — WARFARIN SODIUM 5 MG PO TABS: 10.0000 mg | ORAL_TABLET | Freq: Every day | ORAL | Status: DC

## 2011-10-03 ENCOUNTER — Ambulatory Visit (HOSPITAL_BASED_OUTPATIENT_CLINIC_OR_DEPARTMENT_OTHER): Payer: Medicaid Other | Admitting: Pharmacist

## 2011-10-03 ENCOUNTER — Other Ambulatory Visit: Payer: Medicaid Other | Admitting: Lab

## 2011-10-03 DIAGNOSIS — I2699 Other pulmonary embolism without acute cor pulmonale: Secondary | ICD-10-CM

## 2011-10-03 DIAGNOSIS — Z86718 Personal history of other venous thrombosis and embolism: Secondary | ICD-10-CM

## 2011-10-03 DIAGNOSIS — D509 Iron deficiency anemia, unspecified: Secondary | ICD-10-CM

## 2011-10-03 LAB — POCT INR
INR: 1.4
INR: 1.9

## 2011-10-03 LAB — PROTIME-INR
INR: 1.4 — ABNORMAL LOW (ref 2.00–3.50)
Protime: 16.8 Seconds — ABNORMAL HIGH (ref 10.6–13.4)

## 2011-10-03 NOTE — Progress Notes (Signed)
INR = 1.4 likely due to increased vit K intake. Pt will take 12.5 mg today & Wed then 10 mg other days.  She understands plan. Return in 2 weeks.  If at that visit INR is ok, she can return 5/21 (same day as MD visit). Pt asked about Xarelto today.  I reviewed the pros & cons of the drug but overall she is hesitant to switch due to no antidote. Marily Lente, Pharm.D.

## 2011-10-17 ENCOUNTER — Ambulatory Visit: Payer: Medicaid Other

## 2011-10-17 ENCOUNTER — Other Ambulatory Visit: Payer: Medicaid Other

## 2011-10-18 ENCOUNTER — Telehealth: Payer: Self-pay | Admitting: Oncology

## 2011-10-18 NOTE — Telephone Encounter (Signed)
pt had l/m to r/s 5/6,done,r/s to 5/7   aom

## 2011-10-24 ENCOUNTER — Ambulatory Visit: Payer: Medicaid Other

## 2011-10-24 ENCOUNTER — Other Ambulatory Visit: Payer: Medicaid Other | Admitting: Lab

## 2011-10-25 ENCOUNTER — Other Ambulatory Visit: Payer: Medicaid Other | Admitting: Lab

## 2011-10-25 ENCOUNTER — Ambulatory Visit: Payer: Medicaid Other

## 2011-10-27 ENCOUNTER — Other Ambulatory Visit: Payer: Medicaid Other | Admitting: Lab

## 2011-10-27 ENCOUNTER — Ambulatory Visit: Payer: Medicaid Other

## 2011-11-01 ENCOUNTER — Telehealth: Payer: Self-pay | Admitting: Pharmacist

## 2011-11-08 ENCOUNTER — Other Ambulatory Visit: Payer: Medicaid Other | Admitting: Lab

## 2011-11-08 ENCOUNTER — Ambulatory Visit: Payer: Medicaid Other

## 2011-11-08 ENCOUNTER — Ambulatory Visit: Payer: Medicaid Other | Admitting: Oncology

## 2011-11-10 ENCOUNTER — Ambulatory Visit (HOSPITAL_BASED_OUTPATIENT_CLINIC_OR_DEPARTMENT_OTHER): Payer: Medicaid Other | Admitting: Lab

## 2011-11-10 ENCOUNTER — Telehealth: Payer: Self-pay | Admitting: Oncology

## 2011-11-10 ENCOUNTER — Ambulatory Visit (HOSPITAL_BASED_OUTPATIENT_CLINIC_OR_DEPARTMENT_OTHER): Payer: Medicaid Other | Admitting: Pharmacist

## 2011-11-10 DIAGNOSIS — I2699 Other pulmonary embolism without acute cor pulmonale: Secondary | ICD-10-CM

## 2011-11-10 LAB — PROTIME-INR
INR: 1.6 — ABNORMAL LOW (ref 2.00–3.50)
Protime: 19.2 Seconds — ABNORMAL HIGH (ref 10.6–13.4)

## 2011-11-10 LAB — POCT INR: INR: 1.6

## 2011-11-10 NOTE — Telephone Encounter (Signed)
Pt came in as she had missed her appts,r/s labs-coag for today and sara for 5/28    aom

## 2011-11-10 NOTE — Progress Notes (Signed)
Pt has stopped taking many medications.  Pt states she has missed a couple doses of coumadin since we saw her last on 10/03/11.  INR still low at this visit.  Will increase to 12.5mg  M/F and 10mg  other days.  Pt has appt with Dr. Arline Asp on 5/28 so will check PT/INR at that time.

## 2011-11-15 ENCOUNTER — Telehealth: Payer: Self-pay | Admitting: Oncology

## 2011-11-15 ENCOUNTER — Other Ambulatory Visit (HOSPITAL_BASED_OUTPATIENT_CLINIC_OR_DEPARTMENT_OTHER): Payer: Medicaid Other | Admitting: Lab

## 2011-11-15 ENCOUNTER — Ambulatory Visit: Payer: Medicaid Other | Admitting: Pharmacist

## 2011-11-15 ENCOUNTER — Ambulatory Visit (HOSPITAL_BASED_OUTPATIENT_CLINIC_OR_DEPARTMENT_OTHER): Payer: Medicaid Other | Admitting: Physician Assistant

## 2011-11-15 ENCOUNTER — Encounter: Payer: Self-pay | Admitting: Physician Assistant

## 2011-11-15 VITALS — BP 121/82 | HR 83 | Temp 98.0°F | Ht 66.0 in | Wt 173.8 lb

## 2011-11-15 DIAGNOSIS — D509 Iron deficiency anemia, unspecified: Secondary | ICD-10-CM

## 2011-11-15 DIAGNOSIS — I2699 Other pulmonary embolism without acute cor pulmonale: Secondary | ICD-10-CM

## 2011-11-15 DIAGNOSIS — D649 Anemia, unspecified: Secondary | ICD-10-CM

## 2011-11-15 DIAGNOSIS — F430 Acute stress reaction: Secondary | ICD-10-CM

## 2011-11-15 LAB — COMPREHENSIVE METABOLIC PANEL
ALT: 10 U/L (ref 0–35)
AST: 12 U/L (ref 0–37)
Albumin: 4.4 g/dL (ref 3.5–5.2)
Alkaline Phosphatase: 84 U/L (ref 39–117)
BUN: 13 mg/dL (ref 6–23)
CO2: 24 mEq/L (ref 19–32)
Calcium: 9 mg/dL (ref 8.4–10.5)
Chloride: 108 mEq/L (ref 96–112)
Creatinine, Ser: 0.73 mg/dL (ref 0.50–1.10)
Glucose, Bld: 94 mg/dL (ref 70–99)
Potassium: 4.3 mEq/L (ref 3.5–5.3)
Sodium: 138 mEq/L (ref 135–145)
Total Bilirubin: 0.3 mg/dL (ref 0.3–1.2)
Total Protein: 7.4 g/dL (ref 6.0–8.3)

## 2011-11-15 LAB — LACTATE DEHYDROGENASE: LDH: 133 U/L (ref 94–250)

## 2011-11-15 LAB — PROTIME-INR
INR: 1.3 — ABNORMAL LOW (ref 2.00–3.50)
Protime: 15.6 Seconds — ABNORMAL HIGH (ref 10.6–13.4)

## 2011-11-15 LAB — IRON AND TIBC
%SAT: 14 % — ABNORMAL LOW (ref 20–55)
Iron: 41 ug/dL — ABNORMAL LOW (ref 42–145)
TIBC: 296 ug/dL (ref 250–470)
UIBC: 255 ug/dL (ref 125–400)

## 2011-11-15 LAB — FERRITIN: Ferritin: 117 ng/mL (ref 10–291)

## 2011-11-15 LAB — POCT INR: INR: 1.3

## 2011-11-15 NOTE — Telephone Encounter (Signed)
Gv pt appt [id number]

## 2011-11-15 NOTE — Progress Notes (Signed)
Pt states she missed dose on Sat and took it late on Sunday.  15mg for two days and 10 mg daily there after. We will recheck her INR next Tue (11/22/11) at 9am. Stopped multiple meds at with her PCP.  Currently on Oxycontin, Nortrip, alprazolam and coumadin.  

## 2011-11-15 NOTE — Progress Notes (Signed)
[facility] OFFICE PROGRESS NOTE  Maria Chimera, MD, MD    INTERVAL HISTORY: Maria [name] returns to clinic for followup of her recurrent bilateral pulmonary emboli as well as history of anemia. Her last clinic visit  Was on March 25, 2011 at which time she was clinically stable, on Coumadin as directed by the Coumadin clinic.  Over the last couple of months, patient admits to being not compliant with anticoagulation, having forgotten to take it on a daily basis due to significant stress in her life. The patient is going through a tough divorce.  Currently, she reports some mild fatigue but is able to complete ADLs.  She has had no fevers, chills or night sweats.  No dyspnea or cough.  She reports less appetite due to stress, stating that her food intake is reduced to one meal a day, and a few fruits in the interim.  She has lost 10 lbs since last visit. She is "trying to put my life together again". She denies any nausea, vomiting, constipation or diarrhea.  No dysuria, frequency or hematuria.No blood in the stools.   No alteration in sensation or balance or swelling of extremities.  She has had no issues with bruising or bleeding  Current medications are reviewed and recorded and she states she is currently on Coumadin which, having missed a dose on 5/26 and 5/27, she was instructed to take 15 mg today and tomorrow, then returning to 10 mg a day PO. She is to be seen again at the clinic on Tuesday, June 4. Today, her PT is 15.6 and INR is 1.3 as of 11/10/2011 it was 19.2 and 1.6, at which time, the patient had missed a couple of doses as well. On 10/03/2011, her PT was 16.8 with INR of 1.4. As for her CBC, the labs are currently pending. On June 3 24 2013 her hemoglobin was 13.3 with hematocrit of 39, ferritin  234,  Iron 94, TIBC 250 and percent sat. 38. On 03/25/2011 her ferritin was 176, with iron of 75 and  TIBC  255, percentage saturation 29. Her LDH on 07/04/2011 was 160, BN  01/17/2011 at 155. Current CBC, and LDH are pending. Her chemistries were normal as of 07/14/2011, with  today's chemistries pending.    MEDICAL HISTORY: Past Medical History  Diagnosis Date  . DVT (deep venous thrombosis)   . Iron deficiency anemia     SURGICAL HISTORY:  Past Surgical History  Procedure Date  . Cesarean section     MEDICATIONS: Current Outpatient Prescriptions  Medication Sig Dispense Refill  . baclofen (LIORESAL) 10 MG tablet Take 10 mg by mouth 4 (four) times daily.      Marland Kitchen FLUoxetine (PROZAC) 10 MG capsule Take 10 mg by mouth daily.        Marland Kitchen LORazepam (ATIVAN) 2 MG tablet Take 4 mg by mouth at bedtime.        . methocarbamol (ROBAXIN) 750 MG tablet Take 750 mg by mouth 3 (three) times daily as needed. For pain      . morphine (MS CONTIN) 60 MG 12 hr tablet Take 120 mg by mouth 2 (two) times daily.        . nortriptyline (PAMELOR) 50 MG capsule Take 150 mg by mouth at bedtime.        Marland Kitchen oxyCODONE (ROXICODONE) 15 MG immediate release tablet Take 30 mg by mouth every 6 (six) hours as needed.       . topiramate (TOPAMAX) 100  MG tablet Take 300 mg by mouth daily. Take 100mg  in AM, 200mg  in PM       . warfarin (COUMADIN) 10 MG tablet Take 10 mg by mouth daily. Except 15mg  on Mondays      . warfarin (COUMADIN) 5 MG tablet Take 2 tablets (10 mg total) by mouth daily.  60 tablet  2    ALLERGIES:  is allergic to gabapentin.  REVIEW OF SYSTEMS:  The rest of the 14-point review of system was negative.   Filed Vitals:   11/15/11 0946  BP: 121/82  Pulse: 83  Temp: 98 F (36.7 C)   Wt Readings from Last 3 Encounters:  11/15/11 173 lb 12.8 oz (78.835 kg)  07/21/11 185 lb (83.915 kg)      PHYSICAL EXAMINATION:  General:  well-nourished in no acute distress.   HEENT:  Eyes:  no scleral icterus.No oropharyngeal lesions.  Neck was without   thyromegaly. No cervical, supraclavicular or axillary adenopathy. Respiratory: lungs were clear bilaterally without wheezing or  crackles.  Cardiovascular:  Regular rate and rhythm, S1/S2, without murmur, rub or gallop.   GI:  abdomen was soft, flat, nontender, nondistended, without organomegaly.    Musculoskeletal:  no spinal tenderness of palpation of vertebral spine.  Skin exam was without ecchymosis, petechiae.   Neuro exam :nonfocal.  Patient was able to get on and off exam table without assistance.  Gait was normal.  Patient was alerted and oriented.  Attention was good.   Language was appropriate.  Mood was normal without depression.  Speech was not pressured.  Thought content was not tangential.         LABORATORY/RADIOLOGY DATA:  No results found for this basename: WBC:5,HGB:5,HCT:5,PLT:5,MCV:5,MCH:5,MCHC:5,RDW:5,NEUTRABS:5,LYMPHSABS:5,MONOABS:5,EOSABS:5,BASOSABS:5,BANDABS:5,BANDSABD:5 in the last 168 hours  CMP   No results found for this basename: NA:5,K:5,CL:5,CO2:5,GLUCOSE:5,BUN:5,CREATININE:5,GFRCGP,:5,CALCIUM:5,MG:5,AST:5,ALT:5,ALKPHOS:5,BILITOT:5 in the last 168 hours      Component Value Date/Time   BILITOT 0.4 07/04/2011 0900   BILITOT 0.4 07/04/2011 0900   BILIDIR 0.1 02/04/2010 1946   IBILI 0.7 02/04/2010 1946     Radiology Studies:  No results found.     ASSESSMENT AND PLAN:  1. Ms. Maria [name] is a 46 year old African American female with a history of recurrent bilateral pulmonary emboli.  She was previously anticoagulated with Lovenox but has been bridged to Coumadin, and her INR today is undertherapeutic at 1.3.due to medicine non compliance. She has been followed at the Coumadin clinic, last seen today. She has been instructed to increase her dose from 10 to 15 mg today and tomorrow, then return to 10 mg po qd and to re evaluate on June 4th.  We will continue current dose and will plan to monitor PT/INR every 2 weeks with dose adjustments based on lab values. She has been told of the importance to remain compliant to the med to prevent further PE recurrence. Patient has agreed to be  more compliant.  2. Patient has a history of low ferritin level.  She actually missed her infusion appointment for intravenous iron in the past. She has been showing normal Ferritin levels, for now, with new levels pending. Will consider Feraheme if these values are abnormal. Patient has been instructed to eat more nutritious foods 3. Patient to discuss with her Primary MD her current nutritional status ,as well as likely situational depression. 4.   The patient will be scheduled for followup with [facility] in November of 2013 at which time we will reassess CBC with differential, comprehensive metabolic panel, LDH, ferritin, iron IBC  as well as PT/INR.  She is instructed to call in the interim if any questions or problems.

## 2011-11-15 NOTE — Patient Instructions (Signed)
Pt states she missed dose on Sat and took it late on Sunday.  15mg  for two days and 10 mg daily there after. We will recheck her INR next Tue (11/22/11) at 9am. Stopped multiple meds at with her PCP.  Currently on Oxycontin, Nortrip, alprazolam and coumadin.

## 2011-11-22 ENCOUNTER — Other Ambulatory Visit (HOSPITAL_BASED_OUTPATIENT_CLINIC_OR_DEPARTMENT_OTHER): Payer: Medicaid Other | Admitting: Lab

## 2011-11-22 ENCOUNTER — Ambulatory Visit (HOSPITAL_BASED_OUTPATIENT_CLINIC_OR_DEPARTMENT_OTHER): Payer: Medicaid Other | Admitting: Pharmacist

## 2011-11-22 DIAGNOSIS — I2699 Other pulmonary embolism without acute cor pulmonale: Secondary | ICD-10-CM

## 2011-11-22 LAB — POCT INR: INR: 2

## 2011-11-22 LAB — PROTIME-INR
INR: 2 (ref 2.00–3.50)
Protime: 24 Seconds — ABNORMAL HIGH (ref 10.6–13.4)

## 2011-11-22 NOTE — Progress Notes (Signed)
INR = 2 after pt took 15 mg x 2 days as instructed last week. No missed doses per pt. No new meds, etc. I will keep her at present dose: 10 mg/day; 15 mg Tu/Th. Return in 10 days.  Pt states she is going out of town for 1 week at the end of the month so I suggested to her that we check the INR again on 12/02/11 prior to her trip. Marily Lente, Pharm.D.

## 2011-12-02 ENCOUNTER — Other Ambulatory Visit (INDEPENDENT_AMBULATORY_CARE_PROVIDER_SITE_OTHER): Payer: Medicaid Other | Admitting: Lab

## 2011-12-02 ENCOUNTER — Ambulatory Visit (HOSPITAL_BASED_OUTPATIENT_CLINIC_OR_DEPARTMENT_OTHER): Payer: Medicaid Other | Admitting: Pharmacist

## 2011-12-02 DIAGNOSIS — Z86718 Personal history of other venous thrombosis and embolism: Secondary | ICD-10-CM

## 2011-12-02 DIAGNOSIS — I2699 Other pulmonary embolism without acute cor pulmonale: Secondary | ICD-10-CM

## 2011-12-02 LAB — PROTIME-INR

## 2011-12-02 LAB — POCT INR: INR: 4.77

## 2011-12-02 LAB — PROTHROMBIN TIME
INR: 4.77 — ABNORMAL HIGH (ref <1.50)
Prothrombin Time: 45.4 seconds — ABNORMAL HIGH (ref 11.6–15.2)

## 2011-12-02 NOTE — Progress Notes (Signed)
INR = 4.77 (sent out for confirmation; venopuncture) Asymptomatic for bleed. Pt took 10 mg/day; 15 mg 3 days this week (instead of 2 days). Pt states she is actually taking her Coumadin like she is supposed to. Pt has 2 upper teeth that are abscessed.  Her DDS (Dr. Excell Seltzer) put her on Ibuprofen 800 mg & PCN VK 500 mg QID.  She has a referral to see an oral surgeon at [facility] (Dr. Chipper Oman) but is on their waiting list. I called [facility] office & s/w Cheryl (ph #[phone]).  I informed her that pt is on Coumadin.  She noted this in pts MR & knows that our Coumadin clinic is willing to correspond w/ them at the appropriate time for possible bridging of anticoagulation around extraction. INR supratherapeutic.  Likely causes: PCN, extra dose of 15 mg this week & potentially, Ibuprofen. I instructed pt to hold today's dose of Coumadin then decrease her dose to 10 mg/day.  I informed her of the risk of bleeding when she uses Ibuprofen on regular basis.  She has OxyIR at home if her pain becomes severe but pt states it doesn't work for her.  I gave her the option of calling her DDS & requesting a different pain medication if APAP does not help control pain.  She states she will not use the Ibuprofen. We will recheck her protime on 12/08/11 (1 week).   Await instruction from oral surgeon on bridging Coumadin if extraction scheduled. Marily Lente, Pharm.D.

## 2011-12-08 ENCOUNTER — Ambulatory Visit (HOSPITAL_BASED_OUTPATIENT_CLINIC_OR_DEPARTMENT_OTHER): Payer: Medicaid Other | Admitting: Pharmacist

## 2011-12-08 ENCOUNTER — Other Ambulatory Visit (HOSPITAL_BASED_OUTPATIENT_CLINIC_OR_DEPARTMENT_OTHER): Payer: Medicaid Other | Admitting: Lab

## 2011-12-08 DIAGNOSIS — I2699 Other pulmonary embolism without acute cor pulmonale: Secondary | ICD-10-CM

## 2011-12-08 LAB — PROTIME-INR
INR: 1.4 — ABNORMAL LOW (ref 2.00–3.50)
Protime: 16.8 Seconds — ABNORMAL HIGH (ref 10.6–13.4)

## 2011-12-08 LAB — POCT INR: INR: 1.4

## 2011-12-08 NOTE — Patient Instructions (Addendum)
Pt states she had her PCP change her meds and she has stress in life.  She removed all meds but Coumadin, Oxycodone and changed ativan to xanax 2mg at bedtime.  She states she missed a dose on Monday and ate broccoli these past few days. She will take 10 mg daily and keep a food diary as she really misses her greens.  I discussed consistency is the key and we need to know about it in order to keep her INR as stable as possible.   RTC in one week.  

## 2011-12-08 NOTE — Progress Notes (Signed)
Pt states she had her PCP change her meds and she has stress in life.  She removed all meds but Coumadin, Oxycodone and changed ativan to xanax 2mg  at bedtime.  She states she missed a dose on Monday and ate broccoli these past few days. She will take 10 mg daily and keep a food diary as she really misses her greens.  I discussed consistency is the key and we need to know about it in order to keep her INR as stable as possible.   RTC in one week.

## 2011-12-15 ENCOUNTER — Other Ambulatory Visit: Payer: Medicaid Other | Admitting: Lab

## 2011-12-15 ENCOUNTER — Telehealth: Payer: Self-pay | Admitting: Pharmacist

## 2011-12-15 ENCOUNTER — Ambulatory Visit: Payer: Medicaid Other

## 2011-12-15 NOTE — Telephone Encounter (Signed)
Left VM asking patient to reschedule lab/coumadin clinic visit from today.

## 2011-12-20 ENCOUNTER — Ambulatory Visit (HOSPITAL_BASED_OUTPATIENT_CLINIC_OR_DEPARTMENT_OTHER): Payer: Medicaid Other | Admitting: Pharmacist

## 2011-12-20 ENCOUNTER — Other Ambulatory Visit (HOSPITAL_BASED_OUTPATIENT_CLINIC_OR_DEPARTMENT_OTHER): Payer: Medicaid Other | Admitting: Lab

## 2011-12-20 DIAGNOSIS — I2699 Other pulmonary embolism without acute cor pulmonale: Secondary | ICD-10-CM

## 2011-12-20 LAB — PROTIME-INR
INR: 1.1 — ABNORMAL LOW (ref 2.00–3.50)
Protime: 13.2 Seconds (ref 10.6–13.4)

## 2011-12-20 LAB — POCT INR: INR: 1.1
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
## 2011-12-26 ENCOUNTER — Ambulatory Visit: Payer: Medicaid Other

## 2011-12-26 ENCOUNTER — Other Ambulatory Visit: Payer: Medicaid Other | Admitting: Lab

## 2011-12-27 ENCOUNTER — Telehealth: Payer: Self-pay | Admitting: Pharmacist

## 2012-01-06 ENCOUNTER — Telehealth: Payer: Self-pay | Admitting: Pharmacist

## 2012-01-10 ENCOUNTER — Other Ambulatory Visit (HOSPITAL_BASED_OUTPATIENT_CLINIC_OR_DEPARTMENT_OTHER): Payer: Medicaid Other | Admitting: Lab

## 2012-01-10 ENCOUNTER — Ambulatory Visit (HOSPITAL_BASED_OUTPATIENT_CLINIC_OR_DEPARTMENT_OTHER): Payer: Medicaid Other | Admitting: Pharmacist

## 2012-01-10 DIAGNOSIS — I2699 Other pulmonary embolism without acute cor pulmonale: Secondary | ICD-10-CM

## 2012-01-10 DIAGNOSIS — Z7901 Long term (current) use of anticoagulants: Secondary | ICD-10-CM

## 2012-01-10 LAB — PROTIME-INR
INR: 3.6 — ABNORMAL HIGH (ref 2.00–3.50)
Protime: 43.2 Seconds — ABNORMAL HIGH (ref 10.6–13.4)

## 2012-01-10 LAB — POCT INR: INR: 3.6

## 2012-01-10 NOTE — Patient Instructions (Signed)
Pt is slightly supratherapeutic today at 3.6.  Changed her dose slightly to 12.5 mg on Wed and Fri with 10 mg on other days.  Enforced compliance issues with her to maintain this dose for the next 7 day to allow us a more accurate readings.  I instructed her on the risks of her INRs flucuating as a possible result of non-compliance.  She asked about surgery when a pt is on coumadin.  I told her the MD performing the surgery needs to be in contact with Dr. Murinson and we will need to follow her more closely leading up to and after the procedure.  She did not resume coumadin after a colonoscopy as she stated the gastro MD did not put her back on it and she developed a PE.  Explained compliance again and enforced her sensitivity and need to always be on coumadin.  She will RTC in one week on 7/31 at 10:30. 

## 2012-01-10 NOTE — Progress Notes (Signed)
Pt is slightly supratherapeutic today at 3.6.  Changed her dose slightly to 12.5 mg on Wed and Fri with 10 mg on other days.  Enforced compliance issues with her to maintain this dose for the next 7 day to allow [place] a more accurate readings.  I instructed her on the risks of her INRs flucuating as a possible result of non-compliance.  She asked about surgery when a pt is on coumadin.  I told her the MD performing the surgery needs to be in contact with Dr. Arline Asp and we will need to follow her more closely leading up to and after the procedure.  She did not resume coumadin after a colonoscopy as she stated the gastro MD did not put her back on it and she developed a PE.  Explained compliance again and enforced her sensitivity and need to always be on coumadin.  She will RTC in one week on 7/31 at 10:30.

## 2012-01-18 ENCOUNTER — Other Ambulatory Visit (HOSPITAL_BASED_OUTPATIENT_CLINIC_OR_DEPARTMENT_OTHER): Payer: Medicaid Other | Admitting: Lab

## 2012-01-18 ENCOUNTER — Ambulatory Visit (HOSPITAL_BASED_OUTPATIENT_CLINIC_OR_DEPARTMENT_OTHER): Payer: Medicaid Other | Admitting: Pharmacist

## 2012-01-18 DIAGNOSIS — I2699 Other pulmonary embolism without acute cor pulmonale: Secondary | ICD-10-CM

## 2012-01-18 DIAGNOSIS — Z7901 Long term (current) use of anticoagulants: Secondary | ICD-10-CM

## 2012-01-18 LAB — PROTIME-INR
INR: 3.5 (ref 2.00–3.50)
Protime: 42 Seconds — ABNORMAL HIGH (ref 10.6–13.4)

## 2012-01-18 NOTE — Progress Notes (Signed)
INR supratherapeutic today (3.5) after we instructed her to decrease her dose, and she further decreased her dose herself to 10mg  daily.  She may have misunderstood our instructions at the last visit, but I am not sure.  She has modified her dose at will previously.    Pt is confused why her INR is still so high.  Discussed how eating a regular diet can help keep her INRs steady.  When someone is not getting enough nutrition, their INRs can fluctuate.  Advised patient to try and drink less fluids, as she is drinking about 250 fluid ounces each day and filling up on all these fluids.  Suggested that she start small by eating healthy snacks or small meals 4-5 times a day instead of trying to eat 3 large meals a day.  Pt communicated understanding.  I also pressed upon her the importance of compliance with our prescribed doses of Coumadin, and her other medications.  She SHOULD NOT START OR STOP any medications without the direction of a doctor.  I particularly stressed this with antibiotics and antidepressants, as both of these can have marked consequences if she stops these suddenly on her own.  Pt expressed understanding again.  Will have patient hold x 1 dose, then resume 10mg  daily.  Will recheck INR in 1 week to see if INR returns to therapeutic range.

## 2012-01-26 ENCOUNTER — Ambulatory Visit: Payer: Medicaid Other

## 2012-01-26 ENCOUNTER — Other Ambulatory Visit: Payer: Medicaid Other | Admitting: Lab

## 2012-01-27 ENCOUNTER — Ambulatory Visit: Payer: Medicaid Other

## 2012-01-27 ENCOUNTER — Other Ambulatory Visit: Payer: Medicaid Other | Admitting: Lab

## 2012-02-03 ENCOUNTER — Other Ambulatory Visit: Payer: Self-pay | Admitting: Oncology

## 2012-02-03 DIAGNOSIS — I2699 Other pulmonary embolism without acute cor pulmonale: Secondary | ICD-10-CM

## 2012-02-10 ENCOUNTER — Other Ambulatory Visit: Payer: Medicaid Other | Admitting: Lab

## 2012-02-10 ENCOUNTER — Ambulatory Visit: Payer: Medicaid Other

## 2012-02-10 ENCOUNTER — Emergency Department (HOSPITAL_COMMUNITY)
Admission: EM | Admit: 2012-02-10 | Discharge: 2012-02-10 | Disposition: A | Discharge status: Home / Self Care | Payer: Medicaid Other | Attending: Emergency Medicine | Admitting: Emergency Medicine

## 2012-02-10 ENCOUNTER — Encounter (HOSPITAL_COMMUNITY): Payer: Self-pay | Admitting: Emergency Medicine

## 2012-02-10 ENCOUNTER — Emergency Department (HOSPITAL_COMMUNITY): Payer: Medicaid Other

## 2012-02-10 ENCOUNTER — Telehealth: Payer: Self-pay | Admitting: *Deleted

## 2012-02-10 DIAGNOSIS — R1110 Vomiting, unspecified: Secondary | ICD-10-CM

## 2012-02-10 DIAGNOSIS — R5381 Other malaise: Secondary | ICD-10-CM | POA: Insufficient documentation

## 2012-02-10 DIAGNOSIS — Z79899 Other long term (current) drug therapy: Secondary | ICD-10-CM | POA: Insufficient documentation

## 2012-02-10 DIAGNOSIS — R55 Syncope and collapse: Secondary | ICD-10-CM | POA: Insufficient documentation

## 2012-02-10 DIAGNOSIS — Z7982 Long term (current) use of aspirin: Secondary | ICD-10-CM | POA: Insufficient documentation

## 2012-02-10 DIAGNOSIS — R109 Unspecified abdominal pain: Secondary | ICD-10-CM | POA: Insufficient documentation

## 2012-02-10 DIAGNOSIS — R5383 Other fatigue: Secondary | ICD-10-CM | POA: Insufficient documentation

## 2012-02-10 DIAGNOSIS — R531 Weakness: Secondary | ICD-10-CM

## 2012-02-10 DIAGNOSIS — R197 Diarrhea, unspecified: Secondary | ICD-10-CM

## 2012-02-10 DIAGNOSIS — R112 Nausea with vomiting, unspecified: Secondary | ICD-10-CM | POA: Insufficient documentation

## 2012-02-10 DIAGNOSIS — Z86718 Personal history of other venous thrombosis and embolism: Secondary | ICD-10-CM | POA: Insufficient documentation

## 2012-02-10 LAB — COMPREHENSIVE METABOLIC PANEL
ALT: 11 U/L (ref 0–35)
AST: 18 U/L (ref 0–37)
Albumin: 4.1 g/dL (ref 3.5–5.2)
Alkaline Phosphatase: 66 U/L (ref 39–117)
BUN: 5 mg/dL — ABNORMAL LOW (ref 6–23)
CO2: 23 mEq/L (ref 19–32)
Calcium: 9 mg/dL (ref 8.4–10.5)
Chloride: 105 mEq/L (ref 96–112)
Creatinine, Ser: 0.58 mg/dL (ref 0.50–1.10)
GFR calc Af Amer: >90 mL/min (ref >90)
GFR calc non Af Amer: >90 mL/min (ref >90)
Glucose, Bld: 105 mg/dL — ABNORMAL HIGH (ref 70–99)
Potassium: 3.7 mEq/L (ref 3.5–5.1)
Sodium: 139 mEq/L (ref 135–145)
Total Bilirubin: 0.3 mg/dL (ref 0.3–1.2)
Total Protein: 7.8 g/dL (ref 6.0–8.3)

## 2012-02-10 LAB — URINALYSIS, ROUTINE W REFLEX MICROSCOPIC
Bilirubin Urine: NEGATIVE
Glucose, UA: NEGATIVE mg/dL
Hgb urine dipstick: NEGATIVE
Ketones, ur: NEGATIVE mg/dL
Leukocytes, UA: NEGATIVE
Nitrite: NEGATIVE
Protein, ur: NEGATIVE mg/dL
Specific Gravity, Urine: 1.003 — ABNORMAL LOW (ref 1.005–1.030)
Urobilinogen, UA: 0.2 mg/dL (ref 0.0–1.0)
pH: 8.5 — ABNORMAL HIGH (ref 5.0–8.0)

## 2012-02-10 LAB — CBC WITH DIFFERENTIAL/PLATELET
Basophils Absolute: 0 10*3/uL (ref 0.0–0.1)
Basophils Relative: 1 % (ref 0–1)
Eosinophils Absolute: 0.1 10*3/uL (ref 0.0–0.7)
Eosinophils Relative: 3 % (ref 0–5)
HCT: 37.8 % (ref 36.0–46.0)
Hemoglobin: 12.7 g/dL (ref 12.0–15.0)
Lymphocytes Relative: 37 % (ref 12–46)
Lymphs Abs: 2.1 10*3/uL (ref 0.7–4.0)
MCH: 28.3 pg (ref 26.0–34.0)
MCHC: 33.6 g/dL (ref 30.0–36.0)
MCV: 84.4 fL (ref 78.0–100.0)
Monocytes Absolute: 0.4 10*3/uL (ref 0.1–1.0)
Monocytes Relative: 7 % (ref 3–12)
Neutro Abs: 3 10*3/uL (ref 1.7–7.7)
Neutrophils Relative %: 54 % (ref 43–77)
Platelets: 460 10*3/uL — ABNORMAL HIGH (ref 150–400)
RBC: 4.48 MIL/uL (ref 3.87–5.11)
RDW: 15.3 % (ref 11.5–15.5)
WBC: 5.7 10*3/uL (ref 4.0–10.5)

## 2012-02-10 LAB — GLUCOSE, CAPILLARY: Glucose-Capillary: 107 mg/dL — ABNORMAL HIGH (ref 70–99)

## 2012-02-10 LAB — PREGNANCY, URINE: Preg Test, Ur: NEGATIVE

## 2012-02-10 LAB — LIPASE, BLOOD: Lipase: 24 U/L (ref 11–59)

## 2012-02-10 MED ORDER — ONDANSETRON 8 MG PO TBDP: ORAL_TABLET | ORAL | Status: AC

## 2012-02-10 MED ORDER — FENTANYL CITRATE 0.05 MG/ML IJ SOLN
50.0000 ug | Freq: Once | INTRAMUSCULAR | Status: AC
  Administered 2012-02-10: 50 ug via INTRAVENOUS
  Filled 2012-02-10: qty 2

## 2012-02-10 MED ORDER — SODIUM CHLORIDE 0.9 % IV BOLUS (SEPSIS)
1000.0000 mL | Freq: Once | INTRAVENOUS | Status: AC
  Administered 2012-02-10: 1000 mL via INTRAVENOUS

## 2012-02-10 MED ORDER — METOCLOPRAMIDE HCL 10 MG PO TABS: 10.0000 mg | ORAL_TABLET | Freq: Four times a day (QID) | ORAL | Status: DC | PRN

## 2012-02-10 MED ORDER — SODIUM CHLORIDE 0.9 % IV SOLN
INTRAVENOUS | Status: DC
  Administered 2012-02-10: 11:00:00 via INTRAVENOUS

## 2012-02-10 MED ORDER — IOHEXOL 300 MG/ML  SOLN
100.0000 mL | Freq: Once | INTRAMUSCULAR | Status: AC | PRN
  Administered 2012-02-10: 100 mL via INTRAVENOUS

## 2012-02-10 NOTE — ED Notes (Signed)
Upon first assess, pt without verbal response, unable to stand or move arms and legs. Staff lifted pt into bed and pt remained limp. Pt unable to verbalize when questioned. Dr bednar called to room and after a few minutes pt began to verbally respond and move arms and legs. No drawing of mouth, able to squeeze eyes shut and open mouth upon command.
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
## 2012-02-10 NOTE — ED Notes (Signed)
Patient unable to stand for orthostatics.  

## 2012-02-10 NOTE — Telephone Encounter (Signed)
PT. IS IN A WHEELCHAIR. SHE IS CRYING AND SAYING "HER BODY IS LOCKED UP". PT. TOOK HER MEDICATIONS THIS MORNING WHICH INCLUDED GABAPENTIN, PENICILLIN, AND A DAILY MEDICATION TO PREVENT MIGRAINES. SHE HAS TAKEN PENICILLIN IN THE PAST WITHOUT A REACTION. PT. IS NOT EXPERIENCING A MIGRAINE AT THIS TIME. SHE IS UNABLE TO SQUEEZE MY FINGERS BILATERALLY. PT. COULD BARELY LIFT HER FEET. SHE HAS NO SHORTNESS OR BREATH OR PROBLEMS WITH HER SPEECH. THIS NURSE TOOK PT. AND HER DAUGHTER TO THE EMERGENCY ROOM. SPOKE WITH INTAKE PERSONNEL SHE WILL GET PT. BACK TO THE TRIAGE AREA.

## 2012-02-10 NOTE — ED Notes (Signed)
Per family, pt reported that her face went numb and then all over body.

## 2012-02-24 ENCOUNTER — Emergency Department (INDEPENDENT_AMBULATORY_CARE_PROVIDER_SITE_OTHER)
Admission: EM | Admit: 2012-02-24 | Discharge: 2012-02-24 | Disposition: A | Discharge status: Home / Self Care | Payer: Self-pay | Source: Home / Self Care | Attending: Emergency Medicine | Admitting: Emergency Medicine

## 2012-02-24 ENCOUNTER — Encounter (HOSPITAL_COMMUNITY): Payer: Self-pay | Admitting: *Deleted

## 2012-02-24 DIAGNOSIS — S161XXA Strain of muscle, fascia and tendon at neck level, initial encounter: Secondary | ICD-10-CM

## 2012-02-24 DIAGNOSIS — S139XXA Sprain of joints and ligaments of unspecified parts of neck, initial encounter: Secondary | ICD-10-CM

## 2012-02-24 DIAGNOSIS — G43909 Migraine, unspecified, not intractable, without status migrainosus: Secondary | ICD-10-CM

## 2012-02-24 DIAGNOSIS — S39012A Strain of muscle, fascia and tendon of lower back, initial encounter: Secondary | ICD-10-CM

## 2012-02-24 HISTORY — DX: Other benign neoplasm of skin of trunk: D23.5

## 2012-02-24 HISTORY — DX: Other pulmonary embolism without acute cor pulmonale: I26.99

## 2012-02-24 HISTORY — DX: Fibromyalgia: M79.7

## 2012-02-24 HISTORY — DX: Dorsalgia, unspecified: M54.9

## 2012-02-24 HISTORY — DX: Migraine, unspecified, not intractable, without status migrainosus: G43.909

## 2012-02-24 HISTORY — DX: Other chronic pain: G89.29

## 2012-02-24 HISTORY — DX: Unspecified osteoarthritis, unspecified site: M19.90

## 2012-02-24 MED ORDER — METHOCARBAMOL 500 MG PO TABS: 500.0000 mg | ORAL_TABLET | Freq: Three times a day (TID) | ORAL | Status: AC

## 2012-02-24 MED ORDER — DEXAMETHASONE SODIUM PHOSPHATE 10 MG/ML IJ SOLN
10.0000 mg | Freq: Once | INTRAMUSCULAR | Status: AC
  Administered 2012-02-24: 10 mg via INTRAMUSCULAR

## 2012-02-24 MED ORDER — TRAMADOL HCL 50 MG PO TABS: 100.0000 mg | ORAL_TABLET | Freq: Three times a day (TID) | ORAL | Status: AC | PRN

## 2012-02-24 MED ORDER — DEXAMETHASONE SODIUM PHOSPHATE 10 MG/ML IJ SOLN
INTRAMUSCULAR | Status: AC
  Filled 2012-02-24: qty 1

## 2012-02-24 MED ORDER — KETOROLAC TROMETHAMINE 60 MG/2ML IM SOLN
60.0000 mg | Freq: Once | INTRAMUSCULAR | Status: AC
  Administered 2012-02-24: 60 mg via INTRAMUSCULAR

## 2012-02-24 MED ORDER — KETOROLAC TROMETHAMINE 60 MG/2ML IM SOLN
INTRAMUSCULAR | Status: AC
  Filled 2012-02-24: qty 2
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
## 2012-02-29 ENCOUNTER — Other Ambulatory Visit: Payer: Self-pay | Admitting: Family

## 2012-02-29 ENCOUNTER — Other Ambulatory Visit: Payer: Medicaid Other | Admitting: Lab

## 2012-02-29 DIAGNOSIS — I2699 Other pulmonary embolism without acute cor pulmonale: Secondary | ICD-10-CM

## 2012-02-29 LAB — PROTIME-INR
INR: 1.3 — ABNORMAL LOW (ref 2.00–3.50)
Protime: 15.6 Seconds — ABNORMAL HIGH (ref 10.6–13.4)

## 2012-02-29 LAB — POCT INR: INR: 1.3

## 2012-02-29 NOTE — Progress Notes (Signed)
The patient arrived today to [facility] stating that she needs dental surgery, but her dentist requires a PT/INR prior to the surgery.  I checked with Dr. Arline Asp and the Coumadin Clinic personnel to ensure that me entering an order for a PT/INR for this patient did not conflict with any of their schedules, and it did not.  Ordered entered for PT/INR to be drawn today and the results are to be called and faxed to Dr. Golden Pop, DDS MS at the patient's request.

## 2012-03-02 ENCOUNTER — Ambulatory Visit (HOSPITAL_BASED_OUTPATIENT_CLINIC_OR_DEPARTMENT_OTHER): Payer: Medicaid Other | Admitting: Pharmacist

## 2012-03-02 DIAGNOSIS — I2699 Other pulmonary embolism without acute cor pulmonale: Secondary | ICD-10-CM

## 2012-03-02 NOTE — Progress Notes (Signed)
Pt resumed Coumadin 10mg  daily on 03/02/12 after 2 dental extractions on 03/02/12.  Her Coumadin has been on hold since 02/27/12. She has a follow up appointment with her dentist in 2 weeks. Recheck INR in 1 week; 03/09/12 at 9am for lab and 9:15am for Coumadin Clinic.

## 2012-03-02 NOTE — Patient Instructions (Addendum)
Resume Coumadin 10mg  daily on 03/02/12.  Recheck INR in 1 week; 03/09/12 at 9am for lab and 9:15am for Coumadin Clinic.

## 2012-03-09 ENCOUNTER — Ambulatory Visit: Payer: Self-pay

## 2012-03-09 ENCOUNTER — Other Ambulatory Visit: Payer: Self-pay | Admitting: Lab

## 2012-05-07 ENCOUNTER — Encounter: Payer: Self-pay | Admitting: Family

## 2012-05-07 ENCOUNTER — Telehealth: Payer: Self-pay | Admitting: Oncology

## 2012-05-07 ENCOUNTER — Ambulatory Visit (HOSPITAL_BASED_OUTPATIENT_CLINIC_OR_DEPARTMENT_OTHER): Payer: Medicaid Other | Admitting: Family

## 2012-05-07 ENCOUNTER — Ambulatory Visit: Payer: Self-pay

## 2012-05-07 ENCOUNTER — Other Ambulatory Visit (HOSPITAL_BASED_OUTPATIENT_CLINIC_OR_DEPARTMENT_OTHER): Payer: Medicaid Other | Admitting: Lab

## 2012-05-07 ENCOUNTER — Ambulatory Visit: Payer: Self-pay | Admitting: Pharmacist

## 2012-05-07 VITALS — BP 151/90 | HR 67 | Temp 98.6°F | Resp 20 | Ht 66.0 in | Wt 150.4 lb

## 2012-05-07 DIAGNOSIS — I2699 Other pulmonary embolism without acute cor pulmonale: Secondary | ICD-10-CM

## 2012-05-07 DIAGNOSIS — Z86718 Personal history of other venous thrombosis and embolism: Secondary | ICD-10-CM

## 2012-05-07 DIAGNOSIS — D509 Iron deficiency anemia, unspecified: Secondary | ICD-10-CM

## 2012-05-07 DIAGNOSIS — R21 Rash and other nonspecific skin eruption: Secondary | ICD-10-CM

## 2012-05-07 LAB — CBC WITH DIFFERENTIAL/PLATELET
BASO%: 0.9 % (ref 0.0–2.0)
Basophils Absolute: 0.1 10*3/uL (ref 0.0–0.1)
EOS%: 5.4 % (ref 0.0–7.0)
Eosinophils Absolute: 0.3 10*3/uL (ref 0.0–0.5)
HCT: 38.4 % (ref 34.8–46.6)
HGB: 12.9 g/dL (ref 11.6–15.9)
LYMPH%: 35.5 % (ref 14.0–49.7)
MCH: 28.7 pg (ref 25.1–34.0)
MCHC: 33.5 g/dL (ref 31.5–36.0)
MCV: 85.8 fL (ref 79.5–101.0)
MONO#: 0.3 10*3/uL (ref 0.1–0.9)
MONO%: 6 % (ref 0.0–14.0)
NEUT#: 3 10*3/uL (ref 1.5–6.5)
NEUT%: 52.2 % (ref 38.4–76.8)
Platelets: 286 10*3/uL (ref 145–400)
RBC: 4.48 10*6/uL (ref 3.70–5.45)
RDW: 14.1 % (ref 11.2–14.5)
WBC: 5.8 10*3/uL (ref 3.9–10.3)
lymph#: 2.1 10*3/uL (ref 0.9–3.3)

## 2012-05-07 LAB — COMPREHENSIVE METABOLIC PANEL (CC13)
ALT: 14 U/L (ref 0–55)
AST: 11 U/L (ref 5–34)
Albumin: 3.9 g/dL (ref 3.5–5.0)
Alkaline Phosphatase: 70 U/L (ref 40–150)
BUN: 6 mg/dL — ABNORMAL LOW (ref 7.0–26.0)
CO2: 27 meq/L (ref 22–29)
Calcium: 9.6 mg/dL (ref 8.4–10.4)
Chloride: 106 meq/L (ref 98–107)
Creatinine: 0.8 mg/dL (ref 0.6–1.1)
Glucose: 95 mg/dL (ref 70–99)
Potassium: 3.7 meq/L (ref 3.5–5.1)
Sodium: 139 meq/L (ref 136–145)
Total Bilirubin: 0.41 mg/dL (ref 0.20–1.20)
Total Protein: 7.5 g/dL (ref 6.4–8.3)

## 2012-05-07 LAB — LACTATE DEHYDROGENASE (CC13): LDH: 172 U/L (ref 125–245)

## 2012-05-07 LAB — IRON AND TIBC
%SAT: 20 % (ref 20–55)
Iron: 58 ug/dL (ref 42–145)
TIBC: 297 ug/dL (ref 250–470)
UIBC: 239 ug/dL (ref 125–400)

## 2012-05-07 LAB — PROTIME-INR
INR: 2.1 (ref 2.00–3.50)
Protime: 25.2 s — ABNORMAL HIGH (ref 10.6–13.4)

## 2012-05-07 LAB — POCT INR: INR: 2.1

## 2012-05-07 LAB — FERRITIN: Ferritin: 78 ng/mL (ref 10–291)

## 2012-05-07 NOTE — Progress Notes (Signed)
INR = 2.1 on Coumadin 10 mg/day Pt was unscheduled Coumadin clinic visit; walked in after she saw Larina Bras, NP today.  Stated Dr. Arline Asp wanted her to get back in to see the pharmacists at our Coumadin clinic since she has not been compliant w/ her visits. Pt states she has not missed any doses of Coumadin recently. INR at goal. As long as pt is compliant w/ 10 mg/day, this dose keeps her INR therapeutic. Repeat INR in 2 weeks (after Thanksgiving). Marily Lente, Pharm.D.

## 2012-05-07 NOTE — Patient Instructions (Addendum)
Take all medications as prescribed (example Coumadin) Follow up with the Coumadin clinic today and then weekly thereafter Seriously consider smoking cessation (quitting smoking) Please call us if you have any questions or concerns

## 2012-05-07 NOTE — Telephone Encounter (Signed)
gv pt appt schedule for may 2014 and sent her to [facility]. Per 11/18 pof [facility] today and wkly. S/w desk nurse and informed her that pt will be sent back today and [facility] will give other appts.

## 2012-05-07 NOTE — Progress Notes (Signed)
Patient ID: Maria [name], female   DOB: 06-Aug-1965, 46 y.o.   MRN: [id number] CSN: [id number]  Cc: Betti D. Pecola Leisure, MD   Problem List: Maria [name] is a 46 y.o. African-American female with a problem list consisting of:  1.  Recurrent bilateral pulmonary emboli   (August 2010, May 2011) (doppler negative) (hypercoag negative). She was previously anticoagulated with Lovenox but has been bridged to Coumadin. 2.  Endometrial ablation with Novasure (08/04/2010) 3.  Vaginal bleeding 4.  Iron deficiency anemia 5.  Chronic pain syndrome/Fibromyalgia 6.  Chronic migraine headaches 7.  Hypervariability from Coumadin 8.  HTN  Dr. Arline Asp and I saw Ms. Maria [name] today for followup of her recurrent bilateral pulmonary emboli and history of anemia. Her last office visit was on 11/15/2011.   The patient states that she has not been to the Coumadin clinic since August 2013 and hyper-variability in Coumadin levels are evident.  [name] was visiting the Coumadin Clinic for monitoring and management once weekly, prior to August 2013. Over the last couple of months [name] states that she was mostly compliant with anticoagulation, but admits that she did have some missed doses.  She also states she has ongoing stressors related to an impending divorce from her husband and having 5 children at home. She reports an ongoing loss of appetite due to stress, and has lost almost 40 lbs since January 2012.  Better/healthy nutritional intake, smoking cessation, preventive medicine (i.e mammograms) and medication/monitoring compliance were discussed at length with the patient.  [name] showed a contact dermatitis rash that she has in her mid abdominal area (waistband area).  Spoke to her about OTC Cortisone creams and eliminating the irritant. She denies any other symptomatology including SOB, chest pain, fevers, chills, N/V/D, constipation, unusual bleeding/bruising or night sweats. No dyspnea or cough.   Past  Medical History: Past Medical History  Diagnosis Date  . DVT (deep venous thrombosis)   . Iron deficiency anemia   . PE (pulmonary embolism)   . Fibromyalgia   . Chronic back pain   . Benign tumor of abdominal wall   . Arthritis   . Migraines     Surgical History: Past Surgical History  Procedure Date  . Cesarean section   . Hernia repair   . Endometrial ablation     Current Medications: Current Outpatient Prescriptions  Medication Sig Dispense Refill  . alprazolam (XANAX) 2 MG tablet Take 2 mg by mouth at bedtime as needed. For sleep.      . nortriptyline (PAMELOR) 50 MG capsule Take 150 mg by mouth at bedtime.        Marland Kitchen oxycodone (ROXICODONE) 30 MG immediate release tablet Take 30 mg by mouth every 4 (four) hours as needed. For pain.       Marland Kitchen topiramate (TOPAMAX) 100 MG tablet Take 100-200 mg by mouth as needed. Take 100mg  in AM, 200mg  in PM      . warfarin (COUMADIN) 5 MG tablet Take 10 mg by mouth daily.      . metoCLOPramide (REGLAN) 10 MG tablet Take 1 tablet (10 mg total) by mouth every 6 (six) hours as needed (nausea/headache).  6 tablet  0    Allergies: Allergies  Allergen Reactions  . Gabapentin Swelling    Per patient report: "stroke-like symptoms", but hx states "swelling"    Family History: Family History  Problem Relation Age of Onset  . Cancer Mother   . Cancer Father   . Diabetes Sister   .  Hypertension Sister     Social History: History  Substance Use Topics  . Smoking status: Current Every Day Smoker  . Smokeless tobacco: Never Used  . Alcohol Use: No    Review of Systems: 10 Point review of systems was completed and is negative except as noted above.   Physical Exam:   Blood pressure 151/90, pulse 67, temperature 98.6 F (37 C), temperature source Oral, resp. rate 20, height 5\' 6"  (1.676 m), weight 150 lb 6.4 oz (68.221 kg).  General appearance: Alert, cooperative, well nourished, no apparent distress Head: Normocephalic, without  obvious abnormality, atraumatic Eyes: Conjunctivae/corneas clear, PERRLA, EOMI Nose: Nares, septum and mucosa are normal, no drainage or sinus tenderness Neck: No adenopathy, supple, symmetrical, trachea midline, thyroid not enlarged, no tenderness Resp: Clear to auscultation bilaterally Cardio: Regular rate and rhythm, S1, S2 normal, no murmur, click, rub or gallop GI: Soft, non-tender, hypoactive bowel sounds, no organomegaly, contact dermatitis on right mid-abdominal area Extremities: Extremities normal, atraumatic, no cyanosis or edema Lymph nodes: Cervical, supraclavicular, and axillary nodes normal Neurologic: Grossly normal   Laboratory Data: Results for orders placed in visit on 05/07/12 (from the past 48 hour(s))  CBC WITH DIFFERENTIAL     Status: Normal   Collection Time   05/07/12  9:39 AM      Component Value Range Comment   WBC 5.8  3.9 - 10.3 10e3/uL    NEUT# 3.0  1.5 - 6.5 10e3/uL    HGB 12.9  11.6 - 15.9 g/dL    HCT 81.1  91.4 - 78.2 %    Platelets 286  145 - 400 10e3/uL    MCV 85.8  79.5 - 101.0 fL    MCH 28.7  25.1 - 34.0 pg    MCHC 33.5  31.5 - 36.0 g/dL    RBC 9.56  2.13 - 0.86 10e6/uL    RDW 14.1  11.2 - 14.5 %    lymph# 2.1  0.9 - 3.3 10e3/uL    MONO# 0.3  0.1 - 0.9 10e3/uL    Eosinophils Absolute 0.3  0.0 - 0.5 10e3/uL    Basophils Absolute 0.1  0.0 - 0.1 10e3/uL    NEUT% 52.2  38.4 - 76.8 %    LYMPH% 35.5  14.0 - 49.7 %    MONO% 6.0  0.0 - 14.0 %    EOS% 5.4  0.0 - 7.0 %    BASO% 0.9  0.0 - 2.0 %   FERRITIN     Status: Normal (Preliminary result)   Collection Time   05/07/12  9:39 AM      Component Value Range Comment   Ferritin 78  10 - 291 ng/mL   COMPREHENSIVE METABOLIC PANEL (CC13)     Status: Abnormal   Collection Time   05/07/12  9:39 AM      Component Value Range Comment   Sodium 139  136 - 145 mEq/L    Potassium 3.7  3.5 - 5.1 mEq/L    Chloride 106  98 - 107 mEq/L    CO2 27  22 - 29 mEq/L    Glucose 95  70 - 99 mg/dl    BUN 6.0 (*)  7.0 - 57.8 mg/dL    Creatinine 0.8  0.6 - 1.1 mg/dL    Total Bilirubin 4.69  0.20 - 1.20 mg/dL    Alkaline Phosphatase 70  40 - 150 U/L    AST 11  5 - 34 U/L    ALT 14  0 -  55 U/L    Total Protein 7.5  6.4 - 8.3 g/dL    Albumin 3.9  3.5 - 5.0 g/dL    Calcium 9.6  8.4 - 54.0 mg/dL   LACTATE DEHYDROGENASE (CC13)     Status: Normal   Collection Time   05/07/12  9:39 AM      Component Value Range Comment   LDH 172  125 - 245 U/L 04/26/12 - NOTE new reference range.  PROTIME-INR     Status: Abnormal   Collection Time   05/07/12  9:40 AM      Component Value Range Comment   Protime 25.2 (*) 10.6 - 13.4 Seconds    INR 2.10  2.00 - 3.50    Lovenox No        Imaging Studies: 1.  Diagnostic portable chest x-ray on 11/09/2010 showed no acute cardiopulmonary process seen. 2.  CTA chest with and without contrast on 07/13/2012  showed no pulmonary embolus.  Bilateral dependent ground-glass air space disease is likely due to expiratory phase imaging. Pneumonia cannot be excluded. 3.  CT of the head without contrast on 07/14/2011 showed no acute intracranial abnormality is present. 4.  CT of the abdomen/pelvis with contrast showed small to moderate amount of free fluid in the pelvis may be due to a recently ruptured ovarian cyst. Low attenuation centrally within the uterus probably represents prominent endometrium related to the patient's menstrual cycle.  The appendix and terminal ileum appear normal.   Impression/Plan: Ms. Yasira Demanche is a 46 year old African American female with a history of recurrent bilateral pulmonary emboli. She was previously anticoagulated with Lovenox but has been bridged to Coumadin. Her INR today is therapeutic at 2.1.  Coumadin monitoring and management have been reinitiated with the Coumadin Clinic today.   Her current dose of Coumadin at 10 mg PO daily will continue and we will plan to monitor a PT/INR level in 2 weeks with dose adjustments based on lab values. She  has been told of the importance to remain compliant with Coumadin to prevent further PE recurrence. Ms. Dunlop voiced understanding and has agreed to be more compliant.   Ms. Pangelinan has been instructed to eat more nutritious foods and avoid prolonged exposure to stress. She is to discuss with Dr. Pecola Leisure about her nutritional status, situational depression and abdominal rash.  We have requested that she obtain a mammogram before the end of the year.  Ms. Silvan received smoking cessation counseling and literature during today's office visit.   We plan to see Ms. Blong again in 6 months on 11/05/2012 at which time we will reassess CBC, CMP LDH, Ferritin, Iron/TIBC and PT/INR. Ms. Annear will contact us in the interim if she has any questions or concerns.   Larina Bras, NP-C 05/07/2012, 9:32 AM

## 2012-05-10 ENCOUNTER — Encounter: Payer: Self-pay | Admitting: Oncology

## 2012-05-10 ENCOUNTER — Other Ambulatory Visit: Payer: Self-pay | Admitting: Oncology

## 2012-05-10 DIAGNOSIS — D509 Iron deficiency anemia, unspecified: Secondary | ICD-10-CM

## 2012-05-10 NOTE — Progress Notes (Signed)
On 05/07/2012, ferritin was 78, decreased from 117 on 11/15/2011.  We will check CBC and ferritin every 2 months.  Patient to return for office visit in May 2014.

## 2012-05-16 ENCOUNTER — Telehealth: Payer: Self-pay | Admitting: *Deleted

## 2012-05-16 NOTE — Telephone Encounter (Signed)
Confirmed over the phone the new date and time of both lab only appointments

## 2012-05-23 ENCOUNTER — Other Ambulatory Visit (HOSPITAL_BASED_OUTPATIENT_CLINIC_OR_DEPARTMENT_OTHER): Payer: Medicaid Other | Admitting: Lab

## 2012-05-23 ENCOUNTER — Ambulatory Visit: Payer: Medicaid Other | Admitting: Pharmacist

## 2012-05-23 DIAGNOSIS — I2699 Other pulmonary embolism without acute cor pulmonale: Secondary | ICD-10-CM

## 2012-05-23 LAB — PROTIME-INR
INR: 1.7 — ABNORMAL LOW (ref 2.00–3.50)
Protime: 20.4 Seconds — ABNORMAL HIGH (ref 10.6–13.4)

## 2012-05-23 LAB — POCT INR: INR: 1.7

## 2012-05-23 NOTE — Progress Notes (Signed)
INR below goal at 1.7. Pt states no missed doses since last visit but does state that she ate more green vegetables and broccoli last night and over the holiday. This is likely lowering the INR. Will not make any changes at this time and continue 10 mg daily. Instructed patient to remain consistent with her diet to help lower her INR flucuations. Pt to return in 2 weeks for lab and coumadin clinic on 12/18 at 8:45 am lab and 9am coumadin clinic. If INR continues to be below goal may need to increase at that time.

## 2012-05-23 NOTE — Patient Instructions (Addendum)
INR below goal today, likely due to brocolli last night No changes Continue coumadin 10 mg daily and return in 2 weeks on 12/8 lab @ 8:45 coumadin clinic at [facility]

## 2012-06-06 ENCOUNTER — Ambulatory Visit: Payer: Self-pay

## 2012-06-06 ENCOUNTER — Other Ambulatory Visit: Payer: Self-pay | Admitting: Lab

## 2012-06-15 ENCOUNTER — Telehealth: Payer: Self-pay | Admitting: Pharmacist

## 2012-06-27 ENCOUNTER — Ambulatory Visit: Payer: Self-pay

## 2012-06-27 ENCOUNTER — Other Ambulatory Visit: Payer: Self-pay | Admitting: Lab

## 2012-07-03 ENCOUNTER — Ambulatory Visit: Payer: Self-pay

## 2012-07-03 ENCOUNTER — Other Ambulatory Visit: Payer: Self-pay | Admitting: Lab

## 2012-07-10 ENCOUNTER — Ambulatory Visit: Payer: Self-pay

## 2012-07-10 ENCOUNTER — Other Ambulatory Visit: Payer: Self-pay

## 2012-08-03 ENCOUNTER — Telehealth: Payer: Self-pay | Admitting: Pharmacist

## 2012-08-03 NOTE — Telephone Encounter (Signed)
called and left vm to make appt for INR check.  pt's last INR was in December.

## 2012-08-15 ENCOUNTER — Other Ambulatory Visit: Payer: Self-pay | Admitting: *Deleted

## 2012-08-15 MED ORDER — WARFARIN SODIUM 5 MG PO TABS: 10.0000 mg | ORAL_TABLET | Freq: Every day | ORAL | Status: DC

## 2012-09-07 ENCOUNTER — Other Ambulatory Visit: Payer: Self-pay | Admitting: Lab

## 2012-09-17 ENCOUNTER — Other Ambulatory Visit: Payer: Self-pay | Admitting: Family Medicine

## 2012-09-17 DIAGNOSIS — Z1231 Encounter for screening mammogram for malignant neoplasm of breast: Secondary | ICD-10-CM

## 2012-09-28 ENCOUNTER — Telehealth: Payer: Self-pay

## 2012-09-28 NOTE — Telephone Encounter (Signed)
Pt called and stated she dropped a dumb bell on her foot a year ago and her R big toe is black and blue and painful. Her feet have been getting more painful so she cannot clean under toenails. She is too embarrassed to call her PCP Dr Leilani Able. I told her I would call Dr Pecola Leisure for her. Dr Arline Asp informed and agrees. lvm at Dr Haskel Schroeder office after hour line.

## 2012-10-04 ENCOUNTER — Other Ambulatory Visit: Payer: Self-pay | Admitting: Family Medicine

## 2012-10-04 ENCOUNTER — Ambulatory Visit
Admission: RE | Admit: 2012-10-04 | Discharge: 2012-10-04 | Disposition: A | Discharge status: Home / Self Care | Payer: Medicaid Other | Source: Ambulatory Visit | Attending: Family Medicine | Admitting: Family Medicine

## 2012-10-04 DIAGNOSIS — R52 Pain, unspecified: Secondary | ICD-10-CM

## 2012-10-08 ENCOUNTER — Telehealth: Payer: Self-pay | Admitting: Oncology

## 2012-10-08 NOTE — Telephone Encounter (Signed)
Per desk nurse moved appt from 5/19 to 5/22 @ 4pm. S/w pt re change and new appt d/t for lb/DM 5/22 @ 3:30pm.

## 2012-10-11 ENCOUNTER — Ambulatory Visit
Admission: RE | Admit: 2012-10-11 | Discharge: 2012-10-11 | Disposition: A | Discharge status: Home / Self Care | Payer: Medicaid Other | Source: Ambulatory Visit | Attending: Family Medicine | Admitting: Family Medicine

## 2012-10-11 DIAGNOSIS — Z1231 Encounter for screening mammogram for malignant neoplasm of breast: Secondary | ICD-10-CM

## 2012-11-05 ENCOUNTER — Ambulatory Visit: Payer: Self-pay | Admitting: Oncology

## 2012-11-05 ENCOUNTER — Other Ambulatory Visit: Payer: Self-pay | Admitting: Lab

## 2012-11-08 ENCOUNTER — Other Ambulatory Visit (HOSPITAL_BASED_OUTPATIENT_CLINIC_OR_DEPARTMENT_OTHER): Payer: Medicaid Other

## 2012-11-08 ENCOUNTER — Ambulatory Visit (HOSPITAL_BASED_OUTPATIENT_CLINIC_OR_DEPARTMENT_OTHER): Payer: Medicaid Other | Admitting: Oncology

## 2012-11-08 ENCOUNTER — Encounter: Payer: Self-pay | Admitting: Oncology

## 2012-11-08 VITALS — BP 183/86 | HR 72 | Temp 98.2°F | Resp 18 | Ht 66.0 in | Wt 141.4 lb

## 2012-11-08 DIAGNOSIS — D509 Iron deficiency anemia, unspecified: Secondary | ICD-10-CM

## 2012-11-08 DIAGNOSIS — I2699 Other pulmonary embolism without acute cor pulmonale: Secondary | ICD-10-CM

## 2012-11-08 DIAGNOSIS — I2782 Chronic pulmonary embolism: Secondary | ICD-10-CM

## 2012-11-08 LAB — COMPREHENSIVE METABOLIC PANEL (CC13)
ALT: 11 U/L (ref 0–55)
AST: 9 U/L (ref 5–34)
Albumin: 3.5 g/dL (ref 3.5–5.0)
Alkaline Phosphatase: 65 U/L (ref 40–150)
BUN: 4.6 mg/dL — ABNORMAL LOW (ref 7.0–26.0)
CO2: 23 mEq/L (ref 22–29)
Calcium: 8.7 mg/dL (ref 8.4–10.4)
Chloride: 108 mEq/L — ABNORMAL HIGH (ref 98–107)
Creatinine: 0.6 mg/dL (ref 0.6–1.1)
Glucose: 79 mg/dl (ref 70–99)
Potassium: 3.6 mEq/L (ref 3.5–5.1)
Sodium: 139 mEq/L (ref 136–145)
Total Bilirubin: 0.5 mg/dL (ref 0.20–1.20)
Total Protein: 6.8 g/dL (ref 6.4–8.3)

## 2012-11-08 LAB — CBC WITH DIFFERENTIAL/PLATELET
BASO%: 0.6 % (ref 0.0–2.0)
Basophils Absolute: 0 10*3/uL (ref 0.0–0.1)
EOS%: 1.9 % (ref 0.0–7.0)
Eosinophils Absolute: 0.1 10*3/uL (ref 0.0–0.5)
HCT: 36.2 % (ref 34.8–46.6)
HGB: 11.9 g/dL (ref 11.6–15.9)
LYMPH%: 45 % (ref 14.0–49.7)
MCH: 27.8 pg (ref 25.1–34.0)
MCHC: 33 g/dL (ref 31.5–36.0)
MCV: 84.3 fL (ref 79.5–101.0)
MONO#: 0.4 10*3/uL (ref 0.1–0.9)
MONO%: 5.2 % (ref 0.0–14.0)
NEUT#: 3.4 10*3/uL (ref 1.5–6.5)
NEUT%: 47.3 % (ref 38.4–76.8)
Platelets: 266 10*3/uL (ref 145–400)
RBC: 4.3 10*6/uL (ref 3.70–5.45)
RDW: 14.1 % (ref 11.2–14.5)
WBC: 7.2 10*3/uL (ref 3.9–10.3)
lymph#: 3.2 10*3/uL (ref 0.9–3.3)

## 2012-11-08 LAB — IRON AND TIBC
%SAT: 15 % — ABNORMAL LOW (ref 20–55)
Iron: 38 ug/dL — ABNORMAL LOW (ref 42–145)
TIBC: 253 ug/dL (ref 250–470)
UIBC: 215 ug/dL (ref 125–400)

## 2012-11-08 LAB — PROTIME-INR
INR: 1.8 — ABNORMAL LOW (ref 2.00–3.50)
Protime: 21.6 Seconds — ABNORMAL HIGH (ref 10.6–13.4)

## 2012-11-08 LAB — LACTATE DEHYDROGENASE (CC13): LDH: 154 U/L (ref 125–245)

## 2012-11-08 LAB — FERRITIN: Ferritin: 92 ng/mL (ref 10–291)

## 2012-11-08 LAB — FOLATE: Folate: 8 ng/mL

## 2012-11-08 NOTE — Progress Notes (Signed)
This office note has been dictated.  #[id number]

## 2012-11-09 ENCOUNTER — Encounter: Payer: Self-pay | Admitting: Medical Oncology

## 2012-11-09 ENCOUNTER — Telehealth: Payer: Self-pay | Admitting: Oncology

## 2012-11-09 ENCOUNTER — Other Ambulatory Visit: Payer: Self-pay | Admitting: Medical Oncology

## 2012-11-09 MED ORDER — RIVAROXABAN 10 MG PO TABS: 20.0000 mg | ORAL_TABLET | Freq: Every day | ORAL | Status: DC

## 2012-11-09 NOTE — Progress Notes (Signed)
CC:   Maria [name], M.D.  PROBLEM LIST: 1. Hypercoagulable state as evidenced by DVT involving the right leg     dating back to 2009 as per the patient's history but documented     pulmonary emboli in August of 2010 and again in May 2011.     Hypercoagulable workup in 2011 was negative as were Doppler     studies.  The patient initially was treated with Lovenox and is     currently on Coumadin.  She has been poorly compliant with     followup, particularly pro-times.  Her pro-times/INRs have been     quite variable in the past.  The patient is going to be transferred     over to Xarelto in May 2014. 2. History of iron deficiency anemia.  On 07/19/2010 the patient     received IV Feraheme 510 mg.  She has been intolerant of oral iron     in the past. 3. History of vaginal bleeding status post endometrial ablation on     08/04/2010. 4. Positive family history of colon cancer in the patient's mother and     father who died of colon cancer and personal history of adenomatous     polyps.  The patient underwent colonoscopy in 2012 by Dr. Jeani Hawking.  Recommendation is for repeat colonoscopy in 2015. 5. Chronic pain syndrome. 6. Fibromyalgia. 7. Chronic migraine headaches. 8. Hypertension.  Blood pressure on 11/08/2012 was 183/86.  MEDICATIONS:  Reviewed and recorded. Current Outpatient Prescriptions  Medication Sig Dispense Refill  . alprazolam (XANAX) 2 MG tablet Take 2 mg by mouth at bedtime as needed. For sleep.      . metoCLOPramide (REGLAN) 10 MG tablet Take 10 mg by mouth as needed.      Marland Kitchen oxycodone (ROXICODONE) 30 MG immediate release tablet Take 30 mg by mouth every 4 (four) hours as needed. For pain.       Marland Kitchen warfarin (COUMADIN) 5 MG tablet Take 2 tablets (10 mg total) by mouth daily.  60 tablet  3  . rivaroxaban (XARELTO) 10 MG TABS tablet Take 2 tablets (20 mg total) by mouth daily.  60 tablet  6   No current facility-administered medications for this visit.     SMOKING HISTORY:  As per the EMR the patient is currently smoking.    HISTORY:  Lashunta Frieden is seen today for followup of her hypercoagulable state with negative hypercoagulable panel currently on the Coumadin 10 mg daily.  Kimorah was last seen here on 05/07/2012.  At that time her INR was 2.10.  She came in on 05/23/2012 and her INR was 1.70.  She has not been in since for reasons that apparently relate to domestic stresses and the death of her father in 09-27-12.  The patient denies any bleeding problems especially vaginal bleeding.  No obvious blood in her stools.  She says she is being compliant with her Coumadin and today her INR is 1.80 with a pro-time of 21.6.  The patient's problems relate to chronic pain syndrome, poor dentition and a sore left toe.  She has been going through a difficult divorce for at least the past year.  She lives in a 1 story house with 4 children, ages 5 through 70.  A 53 year old daughter is out of the house.  The patient gets by on SSI and Medicaid.  She is disabled.  She has had a lot of problems with her  teeth and states that she has difficulty eating.  She says she has lost about 40 pounds over the past year.  In the past 6 months she has lost about 10 pounds.  The patient denies any trouble breathing, any swelling of her legs or any symptoms to suggest recurrent blood clots.  PHYSICAL EXAMINATION:  She is somewhat thin.  Weight is 141 pounds 6.4 ounces as compared with a weight of 150 pounds on 05/07/2012.  Height 5 feet 6 inches.  Body surface area 1.73 sq m.  Blood pressure 183/86. Other vital signs are normal.  We spent a lot of time talking about her elevated blood pressure which has been apparently elevated recently.  I urged her to discuss this with her primary physician, Dr. Pecola [name].  She may need additional confirmation when she is not in doctors' offices and I suggested getting this checked at her local pharmacies or buying  a blood pressure cuff to confirm that her blood pressure was running high. There is no scleral icterus.  Mouth and pharynx are benign.  She has partial plates.  I think she has periodontal disease with some of her teeth being loose.  No peripheral adenopathy palpable.  Heart and lungs are normal.  Breasts are not examined.  Abdomen is benign with no organomegaly or masses palpable.  Extremities:  No peripheral edema.  No calf tenderness.  LABORATORY DATA:  Today, white count 7.2, ANC 3.4, hemoglobin 11.9, hematocrit 36.2, platelets 266,000.  Chemistries are essentially normal. BUN was 5, creatinine 0.6, albumin 3.5, LDH 154.  Iron studies are pending.  On 05/07/2012 ferritin was 78, iron saturation 20% as compared with ferritin of 117 and iron saturation of 14% on 11/15/2011. Prothrombin time today was 21.6 with an INR of 1.80.  IMAGING STUDIES: 1. CT angiogram of the chest on 07/14/2011 showed no evidence of     pulmonary embolism.  There was bilateral dependent ground glass air     space disease felt to be due to expiratory phase imaging, however,     pneumonia could not be excluded. 2. CT scan of the head without IV contrast on 07/14/2011 was negative. 3. CT scan of abdomen and pelvis with IV contrast on 02/10/2012 showed     small to moderate amount of free fluid in the pelvis possibly due     to recent ruptured ovarian cyst.  There was low attenuation     centrally within the uterus probably representing prominent     endometrium related to the patient's menstrual cycle.  The appendix     and terminal ileum appeared normal. 4. X-ray of right great toe on 10/04/2012 was negative. 5. Digital bilateral screening mammogram on 10/11/2012 was negative.   IMPRESSION AND PLAN:  The patient in general seems to be doing reasonably well with no evidence for recurrent blood clots.  At this point, I think we will have the patient take Xarelto 20 mg daily.  She has normal renal function and  this way she does not have to come in for monitoring.  I have reviewed with her the importance of taking her medicine and being compliant.  Unfortunately we have had these discussions before and the patient clearly has not been very compliant with her pro-times and I doubt that she has been compliant and faithful with taking Coumadin.  I have emphasized as strongly as I could the possibility of further blood clots and the possibility of death if she is not compliant with taking her  anticoagulation medicine.  The patient's main problem at this time seems to be related around poor dentition.  She seems frustrated with the dentists that she has seen. We will try to make her an appointment with our dental clinic here run by Dr. Kristin Bruins.  The patient states she is having difficulty eating because of her dentition and actually would like her teeth to be extracted.  Once again, I made the patient aware of her elevated blood pressure, today 183/86.  If we go back to a year ago the patient's blood pressures were in the normal range.  I have suggested that this be further monitored and if in fact her blood pressures are consistently elevated then she needs to be on some antihypertensives medicine.  She will need to discuss this with her primary physician.  We are awaiting the results of her iron studies.  If her ferritin continues to drop she may need additional IV iron and stool cards to check for occult blood.  The patient's family history is quite striking as both of her parents have died of colon cancer.  As stated, the patient did undergo colonoscopy by Dr. Jeani Hawking in 2012.  She says that she had adenomatous polyps and will need to have another colonoscopy in 2015.  I have asked the patient to return in 6 months, at which time we will check CBC, chemistries and iron studies.    ______________________________ Samul Dada, M.D. DSM/MEDQ  D:  11/08/2012  T:  11/09/2012  Job:   [id number]

## 2012-11-14 ENCOUNTER — Telehealth: Payer: Self-pay | Admitting: Medical Oncology

## 2012-11-14 NOTE — Telephone Encounter (Signed)
I called dental clinic at [facility] to see if they could see pt. Doris states they only see pt that are receiving chemo. She did give me names of 2 dentists that take medicaid. Dr. Percell Boston [phone] and Dr. Salvadore Farber [phone]. I called pt with this information and she will call and get her an appointment.

## 2012-11-19 ENCOUNTER — Telehealth: Payer: Self-pay | Admitting: Dietician

## 2012-11-26 ENCOUNTER — Telehealth: Payer: Self-pay | Admitting: Pharmacist

## 2012-11-26 ENCOUNTER — Telehealth: Payer: Self-pay | Admitting: Dietician

## 2012-11-26 NOTE — Telephone Encounter (Signed)
Unable to contact pt by phone to determine if she has started Xarelto.  If she has started it, we can archive her from our Coumadin clinic. No phone numbers are in service.  Ebony Hail, Pharm.D., CPP 11/26/2012@4 :12 PM

## 2013-05-02 ENCOUNTER — Telehealth: Payer: Self-pay | Admitting: Internal Medicine

## 2013-05-02 NOTE — Telephone Encounter (Signed)
returned pt call and r/s 11.17 appt to 11.18 due to pt having court

## 2013-05-06 ENCOUNTER — Other Ambulatory Visit: Payer: Medicaid Other

## 2013-05-06 ENCOUNTER — Ambulatory Visit: Payer: Medicaid Other

## 2013-05-07 ENCOUNTER — Encounter (INDEPENDENT_AMBULATORY_CARE_PROVIDER_SITE_OTHER): Payer: Self-pay

## 2013-05-07 ENCOUNTER — Ambulatory Visit (HOSPITAL_BASED_OUTPATIENT_CLINIC_OR_DEPARTMENT_OTHER): Payer: Medicaid Other | Admitting: Internal Medicine

## 2013-05-07 ENCOUNTER — Other Ambulatory Visit: Payer: Self-pay | Admitting: Internal Medicine

## 2013-05-07 ENCOUNTER — Other Ambulatory Visit (HOSPITAL_BASED_OUTPATIENT_CLINIC_OR_DEPARTMENT_OTHER): Payer: Medicaid Other

## 2013-05-07 ENCOUNTER — Telehealth: Payer: Self-pay | Admitting: Internal Medicine

## 2013-05-07 VITALS — BP 150/87 | HR 78 | Temp 98.2°F | Resp 18 | Ht 66.0 in | Wt 130.9 lb

## 2013-05-07 DIAGNOSIS — E876 Hypokalemia: Secondary | ICD-10-CM

## 2013-05-07 DIAGNOSIS — I2699 Other pulmonary embolism without acute cor pulmonale: Secondary | ICD-10-CM

## 2013-05-07 DIAGNOSIS — Z7901 Long term (current) use of anticoagulants: Secondary | ICD-10-CM

## 2013-05-07 DIAGNOSIS — D509 Iron deficiency anemia, unspecified: Secondary | ICD-10-CM

## 2013-05-07 DIAGNOSIS — R630 Anorexia: Secondary | ICD-10-CM

## 2013-05-07 LAB — COMPREHENSIVE METABOLIC PANEL (CC13)
ALT: 9 U/L (ref 0–55)
AST: 12 U/L (ref 5–34)
Albumin: 4.1 g/dL (ref 3.5–5.0)
Alkaline Phosphatase: 64 U/L (ref 40–150)
Anion Gap: 8 mEq/L (ref 3–11)
BUN: 6.5 mg/dL — ABNORMAL LOW (ref 7.0–26.0)
CO2: 24 mEq/L (ref 22–29)
Calcium: 9.5 mg/dL (ref 8.4–10.4)
Chloride: 107 mEq/L (ref 98–109)
Creatinine: 0.7 mg/dL (ref 0.6–1.1)
Glucose: 81 mg/dl (ref 70–140)
Potassium: 3.4 mEq/L — ABNORMAL LOW (ref 3.5–5.1)
Sodium: 139 mEq/L (ref 136–145)
Total Bilirubin: 1.48 mg/dL — ABNORMAL HIGH (ref 0.20–1.20)
Total Protein: 7.6 g/dL (ref 6.4–8.3)

## 2013-05-07 LAB — CBC WITH DIFFERENTIAL/PLATELET
BASO%: 1.1 % (ref 0.0–2.0)
Basophils Absolute: 0.1 10*3/uL (ref 0.0–0.1)
EOS%: 7.8 % — ABNORMAL HIGH (ref 0.0–7.0)
Eosinophils Absolute: 0.6 10*3/uL — ABNORMAL HIGH (ref 0.0–0.5)
HCT: 38.2 % (ref 34.8–46.6)
HGB: 12.6 g/dL (ref 11.6–15.9)
LYMPH%: 38.2 % (ref 14.0–49.7)
MCH: 28.2 pg (ref 25.1–34.0)
MCHC: 33 g/dL (ref 31.5–36.0)
MCV: 85.5 fL (ref 79.5–101.0)
MONO#: 0.4 10*3/uL (ref 0.1–0.9)
MONO%: 6 % (ref 0.0–14.0)
NEUT#: 3.5 10*3/uL (ref 1.5–6.5)
NEUT%: 46.9 % (ref 38.4–76.8)
Platelets: 315 10*3/uL (ref 145–400)
RBC: 4.47 10*6/uL (ref 3.70–5.45)
RDW: 13.5 % (ref 11.2–14.5)
WBC: 7.4 10*3/uL (ref 3.9–10.3)
lymph#: 2.8 10*3/uL (ref 0.9–3.3)

## 2013-05-07 LAB — IRON AND TIBC CHCC
%SAT: 43 % (ref 21–57)
Iron: 123 ug/dL (ref 41–142)
TIBC: 283 ug/dL (ref 236–444)
UIBC: 160 ug/dL (ref 120–384)

## 2013-05-07 LAB — LACTATE DEHYDROGENASE (CC13): LDH: 149 U/L (ref 125–245)

## 2013-05-07 LAB — FERRITIN CHCC: Ferritin: 115 ng/ml (ref 9–269)

## 2013-05-07 NOTE — Telephone Encounter (Signed)
Appts made per 11/18 POF sw pt CAL mailed shh

## 2013-05-07 NOTE — Patient Instructions (Signed)
Rivaroxaban oral tablets What is this medicine? RIVAROXABAN (ri va ROX a ban) is an anticoagulant (blood thinner). It is used to treat blood clots in the lungs or in the veins. It is also used after knee or hip surgeries to prevent blood clots. It is also used to lower the chance of stroke in people with a medical condition called atrial fibrillation. This medicine may be used for other purposes; ask your health care provider or pharmacist if you have questions. COMMON BRAND NAME(S): Xarelto What should I tell my health care provider before I take this medicine? They need to know if you have any of these conditions: -bleeding disorders -bleeding in the brain -blood in your stools (black or tarry stools) or if you have blood in your vomit -history of stomach bleeding -kidney disease -liver disease -low blood counts, like low white cell, platelet, or red cell counts -recent or planned spinal or epidural procedure -take medicines that treat or prevent blood clots -an unusual or allergic reaction to rivaroxaban, other medicines, foods, dyes, or preservatives -pregnant or trying to get pregnant -breast-feeding How should I use this medicine? Take this medicine by mouth with a glass of water. Follow the directions on the prescription label. Take your medicine at regular intervals. Do not take it more often than directed. Do not stop taking except on your doctor's advice. Stopping this medicine may increase your risk of a blot clot. Be sure to refill your prescription before you run out of medicine. If you are taking this medicine after hip or knee replacement surgery, take it with or without food. If you are taking this medicine for atrial fibrillation, take it with your evening meal. If you are taking this medicine to treat blood clots, take it with food at the same time each day. If you are unable to swallow your tablet, you may crush the tablet and mix it in applesauce. Then, immediately eat the  applesauce. You should eat more food right after you eat the applesauce containing the crushed tablet. Talk to your pediatrician regarding the use of this medicine in children. Special care may be needed. Overdosage: If you think you have taken too much of this medicine contact a poison control center or emergency room at once. NOTE: This medicine is only for you. Do not share this medicine with others. What if I miss a dose? If you take your medicine once a day and miss a dose, take the missed dose as soon as you remember. If you take your medicine twice a day and miss a dose, take the missed dose immediately. In this instance, 2 tablets may be taken at the same time. The next day you should take 1 tablet twice a day as directed. What may interact with this medicine? -aspirin and aspirin-like medicines -certain antibiotics like erythromycin, azithromycin, and clarithromycin -certain medicines for fungal infections like ketoconazole and itraconazole -certain medicines for irregular heart beat like amiodarone, quinidine, dronedarone -certain medicines for seizures like carbamazepine, phenytoin -certain medicines that treat or prevent blood clots like warfarin, enoxaparin, and dalteparin  -conivaptan -diltiazem -felodipine -indinavir -lopinavir; ritonavir -NSAIDS, medicines for pain and inflammation, like ibuprofen or naproxen -ranolazine -rifampin -ritonavir -St. John's wort -verapamil This list may not describe all possible interactions. Give your health care provider a list of all the medicines, herbs, non-prescription drugs, or dietary supplements you use. Also tell them if you smoke, drink alcohol, or use illegal drugs. Some items may interact with your medicine. What should I   watch for while using this medicine? Visit your doctor or health care professional for regular checks on your progress. Your condition will be monitored carefully while you are receiving this medicine. If you are  going to have surgery, tell your doctor or health care professional that you are taking this medicine. Avoid sports and activities that might cause injury while you are using this medicine. Severe falls or injuries can cause unseen bleeding. Be careful when using sharp tools or knives. Consider using an Neurosurgeon. Take special care brushing or flossing your teeth. Report any injuries, bruising, or red spots on the skin to your doctor or health care professional. What side effects may I notice from receiving this medicine? Side effects that you should report to your doctor or health care professional as soon as possible: -allergic reactions like skin rash, itching or hives, swelling of the face, lips, or tongue -back pain -confusion, trouble speaking or understanding -redness, blistering, peeling or loosening of the skin, including inside the mouth -signs and symptoms of bleeding such as bloody or black, tarry stools; red or dark-brown urine; spitting up blood or brown material that looks like coffee grounds; red spots on the skin; unusual bruising or bleeding from the eye, gums, or nose -signs and symptoms of a blood clot such as breathing problems; changes in vision; chest pain; severe, sudden headache; pain, swelling, warmth in the leg; trouble speaking; sudden numbness or weakness of the face, arm, or leg -trouble walking, dizziness, loss of balance or coordination  Side effects that usually do not require medical attention (Report these to your doctor or health care professional if they continue or are bothersome.): -dizziness -muscle pain This list may not describe all possible side effects. Call your doctor for medical advice about side effects. You may report side effects to FDA at 1-800-FDA-1088. Where should I keep my medicine? Keep out of the reach of children. Store at room temperature between 15 and 30 degrees C (59 and 86 degrees F). Throw away any unused medicine after the  expiration date. NOTE: This sheet is a summary. It may not cover all possible information. If you have questions about this medicine, talk to your doctor, pharmacist, or health care provider.  2014, [employer]. (2012-11-21 09:51:31) Deep Vein Thrombosis A deep vein thrombosis (DVT) is a blood clot that develops in a deep vein. A DVT is a clot in the deep, larger veins of the leg, arm, or pelvis. These are more dangerous than clots that might form in veins near the surface of the body. A DVT can lead to complications if the clot breaks off and travels in the bloodstream to the lungs.  A DVT can damage the valves in your leg veins, so that instead of flowing upwards, the blood pools in the lower leg. This is called post-thrombotic syndrome, and can result in pain, swelling, discoloration, and sores on the leg. Once identified, a DVT can be treated. It can also be prevented in some circumstances. Once you have had a DVT, you may be at increased risk for a DVT in the future. CAUSES Blood clots form in a vein for different reasons. Usually several things contribute to blood clots. Contributing factors include:  The flow of blood slows down.  The inside of the vein is damaged in some way.  The person has a condition that makes blood clot more easily. Some people are more likely than others to develop blood clots. That is because they have more factors  that make clots likely. These are called risk factors. Risk factors include:   Older age, especially over 62 years old.  Having a history of blood clots. This means you have had one before. Or, it means that someone else in your family has had blood clots. You may have a genetic tendency to form clots.  Having major or lengthy surgery. This is especially true for surgery on the hip, knee, or belly (abdomen). Hip surgery is particularly high risk.  Breaking a hip or leg.  Sitting or lying still for a long time. This includes long distance  travel, paralysis, or recovery from an illness or surgery.  Cancer, or cancer treatment.  Having a long, thin tube (catheter) placed inside a vein during a medical procedure.  Being overweight (obese).  Pregnancy and childbirth. Hormone changes make the blood clot more easily during pregnancy. The fetus puts pressure on the veins of the pelvis. There is also risk of injury to veins during delivery or a caesarean. The risk is at its highest just after childbirth.  Medicines with the female hormone estrogen. This includes birth control pills and hormone replacement therapy.  Smoking.  Other circulation or heart problems. SYMPTOMS When a clot forms, it can either partially or totally block the blood flow in that vein. Symptoms of a DVT can include:  Swelling of the leg or arm, especially if one side is much worse.  Warmth and redness of the leg or arm, especially if one side is much worse.  Pain in an arm or leg. If the clot is in the leg, symptoms may be more noticeable or worse when standing or walking. The symptoms of a DVT that has traveled to the lungs (pulmonary embolism, PE) usually start suddenly, and include:  Shortness of breath.  Coughing.  Coughing up blood or blood-tinged phlegm.  Chest pain. The chest pain is often worse with deep breaths.  Rapid heartbeat. Anyone with these symptoms should get emergency medical treatment right away. Call your local emergency services (911 in [place] if you have these symptoms. DIAGNOSIS If a DVT is suspected, your caregiver will take a full medical history and carry out a physical exam. Tests that also may be required include:  Blood tests, including studies of the clotting properties of the blood.  Ultrasonography to see if you have clots in your legs or lungs.  X-rays to show the flow of blood when dye is injected into the veins (venography).  Studies of your lungs, if you have any chest symptoms. PREVENTION  Exercise the  legs regularly. Take a brisk 30 minute walk every day.  Maintain a weight that is appropriate for your height.  Avoid sitting or lying in bed for long periods of time without moving your legs.  Women, particularly those over the age of 18, should consider the risks and benefits of taking estrogen medicines, including birth control pills.  Do not smoke, especially if you take estrogen medicines.  Long distance travel can increase your risk of DVT. You should exercise your legs by walking or pumping the muscles every hour.  In-hospital prevention:  Many of the risk factors above relate to situations that exist with hospitalization, either for illness, injury, or elective surgery.  Your caregiver will assess you for the need for venous thromboembolism prophylaxis when you are admitted to the hospital. If you are having surgery, your surgeon will assess you the day of or day after surgery.  Prevention may include medical and nonmedical measures.  TREATMENT Treatment for DVT helps prevent death and disability. The most common treatment for DVT is blood thinning (anticoagulant) medicine, which reduces the blood's tendency to clot. Anticoagulants can stop new blood clots from forming and old ones from growing. They cannot dissolve existing clots. Your body does this by itself over time. Anticoagulants can be given by mouth, by intravenous (IV) access, or by injection. Your caregiver will determine the best program for you.  Heparin or related medicines (low molecular weight heparin) are usually the first treatment for a blood clot. They act quickly. However, they cannot be taken orally.  Heparin can cause a fall in a component of blood that stops bleeding and forms blood clots (platelets). You will be monitored with blood tests to be sure this does not occur.  Warfarin is an anticoagulant that can be swallowed (taken orally). It takes a few days to start working, so usually heparin or related  medicines are used in combination. Once warfarin is working, heparin is usually stopped.  Less commonly, clot dissolving drugs (thrombolytics) are used to dissolve a DVT. They carry a high risk of bleeding, so they are used mainly in severe cases, where a life or limb is threatened.  Very rarely, a blood clot in the leg needs to be removed surgically.  If you are unable to take anticoagulants, your caregiver may arrange for you to have a filter placed in a main vein in your belly (abdomen). This filter prevents clots from traveling to your lungs. HOME CARE INSTRUCTIONS  Take all medicines prescribed by your caregiver. Follow the directions carefully.  Warfarin. Most people will continue taking warfarin after hospital discharge. Your caregiver will advise you on the length of treatment (usually 3 6 months, sometimes lifelong).  Too much and too little warfarin are both dangerous. Too much warfarin increases the risk of bleeding. Too little warfarin continues to allow the risk for blood clots. While taking warfarin, you will need to have regular blood tests to measure your blood clotting time. These blood tests usually include both the prothrombin time (PT) and international normalized ratio (INR) tests. The PT and INR results allow your caregiver to adjust your dose of warfarin. The dose can change for many reasons. It is critically important that you take warfarin exactly as prescribed, and that you have your PT and INR levels drawn exactly as directed.  Many foods, especially foods high in vitamin K can interfere with warfarin and affect the PT and INR results. Foods high in vitamin K include spinach, kale, broccoli, cabbage, collard and turnip greens, brussels sprouts, peas, cauliflower, seaweed, and parsley as well as beef and pork liver, green tea, and soybean oil. You should eat a consistent amount of foods high in vitamin K. Avoid major changes in your diet, or notify your caregiver before  changing your diet. Arrange a visit with a dietitian to answer your questions.  Many medicines can interfere with warfarin and affect the PT and INR results. You must tell your caregiver about any and all medicines you take, this includes all vitamins and supplements. Be especially cautious with aspirin and anti-inflammatory medicines. Ask your caregiver before taking these. Do not take or discontinue any prescribed or over-the-counter medicine except on the advice of your caregiver or pharmacist.  Warfarin can have side effects, primarily excessive bruising or bleeding. You will need to hold pressure over cuts for longer than usual. Your caregiver or pharmacist will discuss other potential side effects.  Alcohol can change the  body's ability to handle warfarin. It is best to avoid alcoholic drinks or consume only very small amounts while taking warfarin. Notify your caregiver if you change your alcohol intake.  Notify your dentist or other caregivers before procedures.  Activity. Ask your caregiver how soon you can go back to normal activities. It is important to stay active to prevent blood clots. If you are on anticoagulant medicine, avoid contact sports.  Exercise. It is very important to exercise. This is especially important while traveling, sitting or standing for long periods of time. Exercise your legs by walking or by pumping the muscles frequently. Take frequent walks.  Compression stockings. These are tight elastic stockings that apply pressure to the lower legs. This pressure can help keep the blood in the legs from clotting. You may need to wear compressions stockings at home to help prevent a DVT.  Smoking. If you smoke, quit. Ask your caregiver for help with quitting smoking.  Learn as much as you can about DVT. Knowing more about the condition should help you keep it from coming back.  Wear a medical alert bracelet or carry a medical alert card. SEEK MEDICAL CARE IF:  You  notice a rapid heartbeat.  You feel weaker or more tired than usual.  You feel faint.  You notice increased bruising.  You feel your symptoms are not getting better in the time expected.  You believe you are having side effects of medicine. SEEK IMMEDIATE MEDICAL CARE IF:  You have chest pain.  You have trouble breathing.  You have new or increased swelling or pain in one leg.  You cough up blood.  You notice blood in vomit, in a bowel movement, or in urine. MAKE SURE YOU:  Understand these instructions.  Will watch your condition.  Will get help right away if you are not doing well or get worse. Document Released: 06/06/2005 Document Revised: 02/29/2012 Document Reviewed: 07/29/2010 [facility] Patient Information 2014 [place], [place].

## 2013-05-09 ENCOUNTER — Other Ambulatory Visit: Payer: Medicaid Other | Admitting: Lab

## 2013-05-09 ENCOUNTER — Ambulatory Visit: Payer: Medicaid Other

## 2013-05-09 NOTE — Progress Notes (Signed)
[facility] OFFICE PROGRESS NOTE  [name], MD [phone], [address] [place] [address]  DIAGNOSIS: Pulmonary embolism, bilateral - Plan: CBC with Differential, Comprehensive metabolic panel  Chief Complaint  Patient presents with  . Iron deficiency anemia    CURRENT THERAPY: Xalreto 20 mg daily.   INTERVAL HISTORY: Samamtha [name] 47 y.o. female with a history of hypercoagulable state as evidenced by DVT involving the RLE (2009) and Pulmonary embolus (01/2009, 10/2009) is here for follow-up.  She was last seen by Dr. Arline Asp in 11/08/2012.  She reports that she is under a lot stress now and over the past couple years due to her pending divorce.  She continues to be a mother of five children (ages 57, 93, 58, 33 and 39).  She has been losing weight but she also attributes this to poor fitting top plate.  She states it makes eating more difficult at times.  She has been prescribed an appetite stimulant by her primary care physician. The patient  gets by on SSI and Medicaid. She is disabled. She has had a lot of problems with her teeth and states that she has difficulty eating. She says she has lost about 40 pounds over the past year. In the past 6 months she has lost about 10 pounds. The patient denies any trouble breathing, any swelling of her legs or any symptoms to suggest recurrent  blood clots.  MEDICAL HISTORY: Past Medical History  Diagnosis Date  . DVT (deep venous thrombosis)   . Iron deficiency anemia   . PE (pulmonary embolism)   . Fibromyalgia   . Chronic back pain   . Benign tumor of abdominal wall   . Arthritis   . Migraines    INTERIM HISTORY: has Pulmonary embolism, bilateral and Iron deficiency anemia on her problem list.    ALLERGIES:  is allergic to gabapentin.  MEDICATIONS: has a current medication list which includes the following prescription(s): alprazolam, metoclopramide, oxycodone, rivaroxaban, and zolpidem.  SURGICAL  HISTORY:  Past Surgical History  Procedure Laterality Date  . Cesarean section    . Hernia repair    . Endometrial ablation     PROBLEM LIST:  1. Hypercoagulable state as evidenced by DVT involving the right leg dating back to 2009 as per the patient's history but documented pulmonary emboli in August of 2010 and again in May 2011. Hypercoagulable workup in 2011 was negative as were Doppler studies. The patient initially was treated with Lovenox and is currently on Coumadin. She has been poorly compliant with followup, particularly pro-times. Her pro-times/INRs have been quite variable in the past. The patient is going to be transferred over to Xarelto in May 2014.  2. History of iron deficiency anemia. On 07/19/2010 the patient received IV Feraheme 510 mg. She has been intolerant of oral iron in the past.  3. History of vaginal bleeding status post endometrial ablation on 08/04/2010.  4. Positive family history of colon cancer in the patient's mother and father who died of colon cancer and personal history of adenomatous polyps. The patient underwent colonoscopy in 2012 by Dr. Jeani Hawking. Recommendation is for repeat colonoscopy in 2015.  5. Chronic pain syndrome.  6. Fibromyalgia.  7. Chronic migraine headaches.  8. Hypertension. Blood pressure on 11/08/2012 was 183/86.  REVIEW OF SYSTEMS:   Constitutional: Denies fevers, chills or + weight loss Eyes: Denies blurriness of vision Ears, nose, mouth, throat, and face: Denies mucositis or sore throat Respiratory: Denies cough, dyspnea  or wheezes Cardiovascular: Denies palpitation, chest discomfort or lower extremity swelling Gastrointestinal:  Denies nausea, heartburn or change in bowel habits Skin: Denies abnormal skin rashes Lymphatics: Denies new lymphadenopathy or easy bruising Neurological:Denies numbness, tingling or new weaknesses Behavioral/Psych: Mood is stable, no new changes  All other systems were reviewed with the patient and  are negative.  PHYSICAL EXAMINATION: ECOG PERFORMANCE STATUS: 0 - Asymptomatic  Blood pressure 150/87, pulse 78, temperature 98.2 F (36.8 C), temperature source Oral, resp. rate 18, height 5\' 6"  (1.676 m), weight 130 lb 14.4 oz (59.376 kg), SpO2 100.00%.  GENERAL:alert, no distress and comfortable; thin.  SKIN: skin color, texture, turgor are normal, no rashes or significant lesions EYES: normal, Conjunctiva are pink and non-injected, sclera clear OROPHARYNX:no exudate, no erythema and lips, buccal mucosa, and tongue normal ; partial plates.  NECK: supple, thyroid normal size, non-tender, without nodularity LYMPH:  no palpable lymphadenopathy in the cervical, axillary or supraclavicular LUNGS: clear to auscultation and percussion with normal breathing effort HEART: regular rate & rhythm and no murmurs and no lower extremity edema ABDOMEN:abdomen soft, non-tender and normal bowel sounds Musculoskeletal:no cyanosis of digits and no clubbing  NEURO: alert & oriented x 3 with fluent speech, no focal motor/sensory deficits  LABORATORY DATA: Results for orders placed in visit on 05/07/13 (from the past 48 hour(s))  CBC WITH DIFFERENTIAL     Status: Abnormal   Collection Time    05/07/13  1:32 PM      Result Value Range   WBC 7.4  3.9 - 10.3 10e3/uL   NEUT# 3.5  1.5 - 6.5 10e3/uL   HGB 12.6  11.6 - 15.9 g/dL   HCT 45.4  09.8 - 11.9 %   Platelets 315  145 - 400 10e3/uL   MCV 85.5  79.5 - 101.0 fL   MCH 28.2  25.1 - 34.0 pg   MCHC 33.0  31.5 - 36.0 g/dL   RBC 1.47  8.29 - 5.62 10e6/uL   RDW 13.5  11.2 - 14.5 %   lymph# 2.8  0.9 - 3.3 10e3/uL   MONO# 0.4  0.1 - 0.9 10e3/uL   Eosinophils Absolute 0.6 (*) 0.0 - 0.5 10e3/uL   Basophils Absolute 0.1  0.0 - 0.1 10e3/uL   NEUT% 46.9  38.4 - 76.8 %   LYMPH% 38.2  14.0 - 49.7 %   MONO% 6.0  0.0 - 14.0 %   EOS% 7.8 (*) 0.0 - 7.0 %   BASO% 1.1  0.0 - 2.0 %  FERRITIN [facility]     Status: None   Collection Time    05/07/13  1:32 PM      Result  Value Range   Ferritin 115  9 - 269 ng/ml  IRON AND TIBC [facility]     Status: None   Collection Time    05/07/13  1:32 PM      Result Value Range   Iron 123  41 - 142 ug/dL   TIBC 130  865 - 784 ug/dL   UIBC 696  295 - 284 ug/dL   %SAT 43  21 - 57 %  COMPREHENSIVE METABOLIC PANEL (CC13)     Status: Abnormal   Collection Time    05/07/13  1:32 PM      Result Value Range   Sodium 139  136 - 145 mEq/L   Potassium 3.4 (*) 3.5 - 5.1 mEq/L   Chloride 107  98 - 109 mEq/L   CO2 24  22 - 29  mEq/L   Glucose 81  70 - 140 mg/dl   BUN 6.5 (*) 7.0 - 09.8 mg/dL   Creatinine 0.7  0.6 - 1.1 mg/dL   Total Bilirubin 1.19 (*) 0.20 - 1.20 mg/dL   Alkaline Phosphatase 64  40 - 150 U/L   AST 12  5 - 34 U/L   ALT 9  0 - 55 U/L   Total Protein 7.6  6.4 - 8.3 g/dL   Albumin 4.1  3.5 - 5.0 g/dL   Calcium 9.5  8.4 - 14.7 mg/dL   Anion Gap 8  3 - 11 mEq/L  LACTATE DEHYDROGENASE (CC13)     Status: None   Collection Time    05/07/13  1:32 PM      Result Value Range   LDH 149  125 - 245 U/L   Labs:  Lab Results  Component Value Date   WBC 7.4 05/07/2013   HGB 12.6 05/07/2013   HCT 38.2 05/07/2013   MCV 85.5 05/07/2013   PLT 315 05/07/2013   NEUTROABS 3.5 05/07/2013      Chemistry      Component Value Date/Time   NA 139 05/07/2013 1332   NA 139 02/10/2012 0945   K 3.4* 05/07/2013 1332   K 3.7 02/10/2012 0945   CL 108* 11/08/2012 1531   CL 105 02/10/2012 0945   CO2 24 05/07/2013 1332   CO2 23 02/10/2012 0945   BUN 6.5* 05/07/2013 1332   BUN 5* 02/10/2012 0945   CREATININE 0.7 05/07/2013 1332   CREATININE 0.58 02/10/2012 0945      Component Value Date/Time   CALCIUM 9.5 05/07/2013 1332   CALCIUM 9.0 02/10/2012 0945   ALKPHOS 64 05/07/2013 1332   ALKPHOS 66 02/10/2012 0945   AST 12 05/07/2013 1332   AST 18 02/10/2012 0945   ALT 9 05/07/2013 1332   ALT 11 02/10/2012 0945   BILITOT 1.48* 05/07/2013 1332   BILITOT 0.3 02/10/2012 0945     Basic Metabolic Panel:  Recent Labs Lab 05/07/13 1332   NA 139  K 3.4*  CO2 24  GLUCOSE 81  BUN 6.5*  CREATININE 0.7  CALCIUM 9.5   GFR Estimated Creatinine Clearance: 82.3 ml/min (by C-G formula based on Cr of 0.7). Liver Function Tests:  Recent Labs Lab 05/07/13 1332  AST 12  ALT 9  ALKPHOS 64  BILITOT 1.48*  PROT 7.6  ALBUMIN 4.1   CBC:  Recent Labs Lab 05/07/13 1332  WBC 7.4  NEUTROABS 3.5  HGB 12.6  HCT 38.2  MCV 85.5  PLT 315   Anemia work up  Recent Labs  05/07/13 1332  FERRITIN 115  TIBC 283  IRON 123   IMAGES: 1. CT angiogram of the chest on 07/14/2011 showed no evidence of pulmonary embolism. There was bilateral dependent ground glass air space disease felt to be due to expiratory phase imaging, however,  pneumonia could not be excluded.  2. CT scan of the head without IV contrast on 07/14/2011 was negative.  3. CT scan of abdomen and pelvis with IV contrast on 02/10/2012 showed small to moderate amount of free fluid in the pelvis possibly due to recent ruptured ovarian cyst. There was low attenuation centrally within the uterus probably representing prominent endometrium related to the patient's menstrual cycle. The appendix  and terminal ileum appeared normal.  4. X-ray of right great toe on 10/04/2012 was negative.  5. Digital bilateral screening mammogram on 10/11/2012 was negative.  ASSESSMENT: Maria [name] 47 y.o.  female with a history of Pulmonary embolism, bilateral - Plan: CBC with Differential, Comprehensive metabolic panel   PLAN:   1. Pulmonary embolism, bilateral.  -- Please continue Xalreto 20 mg.  She has no evidence of recurrent blood clots.  Her renal function remains normal.  She admits to continued compliance.  She is aware that if she does not take her anticoagulation, she can have further blood clots.   2. Iron-deficiency anemia. --Her hemoglobin is 12.6 with normal iron studies.  She denies any hematochezia and melana.    3. Poor dentition. --Patient counseled to continue  good dental follow-up with Dr. Kristin Bruins.   4. Anorexia. --Continue appetite stimulant per PCP.  Dietary supplementation with meals as tolerated.   5. Mild hypokalemia.  --Likely secondary to #4.  We advised her to increase her oral potassium intake with potassium enriched foods.   6. Follow-up. --Patient counseled to continue her anticoagulation.  She should follow-up in 4 months with repeat labs including CBC, Chemistries. Patient requests additional supportive services from her PCP's office.   She will follow-up with [place] if she has any additional questions.    All questions were answered. The patient knows to call the clinic with any problems, questions or concerns. We can certainly see the patient much sooner if necessary.  I spent 15 minutes counseling the patient face to face. The total time spent in the appointment was 25 minutes.    Ulrick Methot, MD 05/09/2013 3:50 AM

## 2013-06-18 ENCOUNTER — Telehealth: Payer: Self-pay | Admitting: Dietician

## 2013-06-18 NOTE — Telephone Encounter (Signed)
Nutrition Brief Note  Patient identified on [facility] Nutrition Screen.    Wt Readings from Last 15 Encounters:  05/07/13 130 lb 14.4 oz (59.376 kg)  11/08/12 141 lb 6.4 oz (64.139 kg)  05/07/12 150 lb 6.4 oz (68.221 kg)  11/15/11 173 lb 12.8 oz (78.835 kg)  07/21/11 185 lb (83.915 kg)   Pt followed by [facility] for anemia. Chart review reveals difficulty eating due to poor fitting dentures. Noted dental consult.   Wt hx reveals a 7.8% wt loss x 6 months and a 13% wt loss x 1 year, which are not clinically significant.   Called at 1305. Pt unavailable at time of call.   If further nutrition concerns arise, please consult [facility] RD.   Valgene Deloatch A. Mayford Knife, RD, LDN Pager: [phone]

## 2013-06-18 NOTE — Telephone Encounter (Signed)
Received voicemail from pt at 1305. Called back at 1330.  Pt admits to weight loss and poor appetite. She reports her largest problem is her ill-fitting dentures. She also reports "I can't taste anything" and "eating is no longer a priority". She refuses to drink nutritional supplements, although she has done so in the past, because "thye taste like medicine".   She reports that her diet mainly consists of liquids, mostly sodas. She reports that she has tried altering the texture of her diet somewhat, but she reports "I don't have one of those machines".  Educated pt on high protein, high calorie diet. Discussed modified consistency diet. Suggested avoiding raw fruits, vegetables, and tough meats and instead consuming cooked fruits and vegetables and meats with sauces and gravies. Discussed mashing well cooked fruits and vegetables with a potato masher or fork into a mashed potato type consistency. Discussed options of adding more protein into diet, such as consuming more beans, milk, and yogurt. Teachback method used. Expect fair to poor compliance.   Pt declined any written information, but expressed thanks for call.   If further nutrition issues arise, please consult [facility] RD.   Kaiana Marion A. Mayford Knife, RD, LDN Pager: [phone]

## 2013-09-04 ENCOUNTER — Other Ambulatory Visit: Payer: Medicaid Other

## 2013-09-04 ENCOUNTER — Ambulatory Visit: Payer: Medicaid Other

## 2013-09-04 ENCOUNTER — Other Ambulatory Visit: Payer: Self-pay

## 2013-09-05 ENCOUNTER — Other Ambulatory Visit: Payer: Self-pay

## 2013-09-06 ENCOUNTER — Telehealth: Payer: Self-pay | Admitting: Internal Medicine

## 2013-09-06 NOTE — Telephone Encounter (Signed)
, °

## 2013-09-30 ENCOUNTER — Other Ambulatory Visit: Payer: Medicaid Other

## 2013-09-30 ENCOUNTER — Ambulatory Visit: Payer: Medicaid Other

## 2013-10-08 ENCOUNTER — Emergency Department (HOSPITAL_COMMUNITY)
Admission: EM | Admit: 2013-10-08 | Discharge: 2013-10-08 | Disposition: A | Discharge status: Home / Self Care | Payer: No Typology Code available for payment source | Attending: Emergency Medicine | Admitting: Emergency Medicine

## 2013-10-08 ENCOUNTER — Encounter (HOSPITAL_COMMUNITY): Payer: Self-pay | Admitting: Emergency Medicine

## 2013-10-08 DIAGNOSIS — Z86718 Personal history of other venous thrombosis and embolism: Secondary | ICD-10-CM | POA: Diagnosis not present

## 2013-10-08 DIAGNOSIS — Z79899 Other long term (current) drug therapy: Secondary | ICD-10-CM | POA: Insufficient documentation

## 2013-10-08 DIAGNOSIS — Y9389 Activity, other specified: Secondary | ICD-10-CM | POA: Diagnosis not present

## 2013-10-08 DIAGNOSIS — Z8739 Personal history of other diseases of the musculoskeletal system and connective tissue: Secondary | ICD-10-CM | POA: Insufficient documentation

## 2013-10-08 DIAGNOSIS — IMO0002 Reserved for concepts with insufficient information to code with codable children: Secondary | ICD-10-CM | POA: Diagnosis present

## 2013-10-08 DIAGNOSIS — Z86711 Personal history of pulmonary embolism: Secondary | ICD-10-CM | POA: Diagnosis not present

## 2013-10-08 DIAGNOSIS — G8929 Other chronic pain: Secondary | ICD-10-CM | POA: Insufficient documentation

## 2013-10-08 DIAGNOSIS — G43909 Migraine, unspecified, not intractable, without status migrainosus: Secondary | ICD-10-CM | POA: Insufficient documentation

## 2013-10-08 DIAGNOSIS — Z7901 Long term (current) use of anticoagulants: Secondary | ICD-10-CM | POA: Insufficient documentation

## 2013-10-08 DIAGNOSIS — Z862 Personal history of diseases of the blood and blood-forming organs and certain disorders involving the immune mechanism: Secondary | ICD-10-CM | POA: Insufficient documentation

## 2013-10-08 DIAGNOSIS — Y92410 Unspecified street and highway as the place of occurrence of the external cause: Secondary | ICD-10-CM | POA: Insufficient documentation

## 2013-10-08 DIAGNOSIS — F17200 Nicotine dependence, unspecified, uncomplicated: Secondary | ICD-10-CM | POA: Diagnosis not present

## 2013-10-08 MED ORDER — CYCLOBENZAPRINE HCL 10 MG PO TABS: 10.0000 mg | ORAL_TABLET | Freq: Two times a day (BID) | ORAL | Status: DC | PRN

## 2013-10-08 NOTE — ED Provider Notes (Signed)
Medical screening examination/treatment/procedure(s) were performed by non-physician practitioner and as supervising physician I was immediately available for consultation/collaboration.   EKG Interpretation None        Malvin Johns, MD 10/08/13 1507
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
## 2013-10-08 NOTE — ED Notes (Signed)
Pt escorted to discharge window. Pt verbalized understanding discharge instructions. In no acute distress.  
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
## 2013-10-14 ENCOUNTER — Telehealth: Payer: Self-pay | Admitting: Internal Medicine
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
## 2013-10-21 ENCOUNTER — Telehealth: Payer: Self-pay

## 2013-10-21 NOTE — Telephone Encounter (Signed)
S/w pt about finding a GP in [place]. She was unhappy with Dr Lin Landsman. I explained she will have to call around. She has [employer].  She was also asking about a neurologist referal and then stated she already has an appt scheduled at [facility] neurolog[facility]. She thanked me for the information.

## 2013-10-31 ENCOUNTER — Other Ambulatory Visit: Payer: Self-pay

## 2013-10-31 ENCOUNTER — Telehealth: Payer: Self-pay | Admitting: Internal Medicine

## 2013-10-31 DIAGNOSIS — IMO0001 Reserved for inherently not codable concepts without codable children: Secondary | ICD-10-CM

## 2013-10-31 DIAGNOSIS — M549 Dorsalgia, unspecified: Secondary | ICD-10-CM

## 2013-10-31 NOTE — Progress Notes (Unsigned)
Pt called at 415 pm 10/30/13. She was c/o pain in R side of body. Dr Ayesha Rumpf was managing her pain but released her from practice. Pt had seen Dr Louanne Skye, neurologist on 5/13 but walked out of the visit. She stated he was rude, cursing, nonchalant. Pt had seen Dr Lidia Collum in [facility] in the past but stopped seeing her b/c she kept missing appts stating the office was too far. Called Dr Ayesha Rumpf office this morning. She was released from care for violating her narcotic agreement. S/w Dr Juliann Mule and he said OK to do pain management referral. POF to scheduler.

## 2013-10-31 NOTE — Telephone Encounter (Signed)
S/w shannon from the [facility] and she is aware of the referral and will have their office review and contact the pt with an appt. Per shannon the office is seeing pt's about a month out.

## 2013-11-01 ENCOUNTER — Other Ambulatory Visit: Payer: Self-pay | Admitting: Specialist

## 2013-11-01 DIAGNOSIS — M542 Cervicalgia: Secondary | ICD-10-CM

## 2013-11-08 ENCOUNTER — Ambulatory Visit (HOSPITAL_BASED_OUTPATIENT_CLINIC_OR_DEPARTMENT_OTHER): Payer: Medicaid Other | Admitting: Internal Medicine

## 2013-11-08 ENCOUNTER — Telehealth: Payer: Self-pay | Admitting: Internal Medicine

## 2013-11-08 ENCOUNTER — Other Ambulatory Visit (HOSPITAL_BASED_OUTPATIENT_CLINIC_OR_DEPARTMENT_OTHER): Payer: Medicaid Other

## 2013-11-08 VITALS — BP 142/85 | HR 82 | Temp 97.1°F | Resp 18 | Ht 66.0 in | Wt 127.5 lb

## 2013-11-08 DIAGNOSIS — M549 Dorsalgia, unspecified: Secondary | ICD-10-CM

## 2013-11-08 DIAGNOSIS — Z7901 Long term (current) use of anticoagulants: Secondary | ICD-10-CM

## 2013-11-08 DIAGNOSIS — I2699 Other pulmonary embolism without acute cor pulmonale: Secondary | ICD-10-CM

## 2013-11-08 DIAGNOSIS — D509 Iron deficiency anemia, unspecified: Secondary | ICD-10-CM

## 2013-11-08 DIAGNOSIS — G8929 Other chronic pain: Secondary | ICD-10-CM

## 2013-11-08 LAB — CBC WITH DIFFERENTIAL/PLATELET
BASO%: 1.3 % (ref 0.0–2.0)
Basophils Absolute: 0.1 10*3/uL (ref 0.0–0.1)
EOS%: 3.7 % (ref 0.0–7.0)
Eosinophils Absolute: 0.2 10*3/uL (ref 0.0–0.5)
HCT: 39.7 % (ref 34.8–46.6)
HGB: 12.9 g/dL (ref 11.6–15.9)
LYMPH%: 43.4 % (ref 14.0–49.7)
MCH: 28.1 pg (ref 25.1–34.0)
MCHC: 32.4 g/dL (ref 31.5–36.0)
MCV: 86.6 fL (ref 79.5–101.0)
MONO#: 0.3 10*3/uL (ref 0.1–0.9)
MONO%: 6.4 % (ref 0.0–14.0)
NEUT#: 2.2 10*3/uL (ref 1.5–6.5)
NEUT%: 45.2 % (ref 38.4–76.8)
Platelets: 300 10*3/uL (ref 145–400)
RBC: 4.59 10*6/uL (ref 3.70–5.45)
RDW: 14.3 % (ref 11.2–14.5)
WBC: 4.9 10*3/uL (ref 3.9–10.3)
lymph#: 2.1 10*3/uL (ref 0.9–3.3)

## 2013-11-08 LAB — COMPREHENSIVE METABOLIC PANEL (CC13)
ALT: 9 U/L (ref 0–55)
AST: 11 U/L (ref 5–34)
Albumin: 4 g/dL (ref 3.5–5.0)
Alkaline Phosphatase: 60 U/L (ref 40–150)
Anion Gap: 11 mEq/L (ref 3–11)
BUN: 8.8 mg/dL (ref 7.0–26.0)
CO2: 25 mEq/L (ref 22–29)
Calcium: 9.3 mg/dL (ref 8.4–10.4)
Chloride: 105 mEq/L (ref 98–109)
Creatinine: 0.8 mg/dL (ref 0.6–1.1)
Glucose: 115 mg/dl (ref 70–140)
Potassium: 3.8 mEq/L (ref 3.5–5.1)
Sodium: 141 mEq/L (ref 136–145)
Total Bilirubin: 0.79 mg/dL (ref 0.20–1.20)
Total Protein: 7.5 g/dL (ref 6.4–8.3)
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
## 2013-11-08 NOTE — Telephone Encounter (Signed)
add to previous note. s/w April @ [facility] (pain rehab) referral received and if pt is accepted office will call pt w/appt. april has my info should she need any other info.

## 2013-11-08 NOTE — Telephone Encounter (Signed)
gv pt appt schedule for august. pain clinic info routed to pain clinic. clinic will review to see if pt is a candidate and call pt w/appt.

## 2013-11-18 ENCOUNTER — Other Ambulatory Visit: Payer: Medicaid Other

## 2013-11-27 ENCOUNTER — Encounter: Payer: Self-pay | Admitting: Physical Medicine & Rehabilitation

## 2013-12-24 ENCOUNTER — Telehealth: Payer: Self-pay | Admitting: Dietician

## 2013-12-24 NOTE — Telephone Encounter (Signed)
Brief Outpatient Oncology Nutrition Note  Patient has been identified to be at risk on malnutrition screen.  Wt Readings from Last 10 Encounters:  11/08/13 127 lb 8 oz (57.834 kg)  05/07/13 130 lb 14.4 oz (59.376 kg)  11/08/12 141 lb 6.4 oz (64.139 kg)  05/07/12 150 lb 6.4 oz (68.221 kg)  11/15/11 173 lb 12.8 oz (78.835 kg)  07/21/11 185 lb (83.915 kg)    Dx:  Pulmonary embolism, bilateral and anemia.  Patient of Dr. Juliann Mule.  Called patient due to weight loss.  Patient was not available.  Chart reviewed.  Patient under increased strength due to divorce 2 years ago with 5 children ages 30, 38, 20, 29, and 24.  RD spoke with patient by phone 06/18/13.  At that time, patient stated weight loss was due to poor appetite, ill-fitting dentures, taste alterations, and stress.  Refused nutritional supplement options.  Educated on soft protein options at that time.  Diet consisted mainly of liquids (mostly soda's).  Message with contact information provided.  Antonieta Iba, RD, LDN

## 2014-01-13 ENCOUNTER — Ambulatory Visit (HOSPITAL_BASED_OUTPATIENT_CLINIC_OR_DEPARTMENT_OTHER): Payer: Medicaid Other | Admitting: Physical Medicine & Rehabilitation

## 2014-01-13 ENCOUNTER — Encounter: Payer: Self-pay | Admitting: Physical Medicine & Rehabilitation

## 2014-01-13 ENCOUNTER — Encounter: Payer: Medicaid Other | Attending: Physical Medicine & Rehabilitation

## 2014-01-13 VITALS — BP 150/87 | HR 77 | Resp 14 | Ht 65.0 in | Wt 137.0 lb

## 2014-01-13 DIAGNOSIS — M25569 Pain in unspecified knee: Secondary | ICD-10-CM

## 2014-01-13 DIAGNOSIS — IMO0001 Reserved for inherently not codable concepts without codable children: Secondary | ICD-10-CM | POA: Insufficient documentation

## 2014-01-13 DIAGNOSIS — M545 Low back pain, unspecified: Secondary | ICD-10-CM | POA: Insufficient documentation

## 2014-01-13 DIAGNOSIS — G8929 Other chronic pain: Secondary | ICD-10-CM | POA: Diagnosis not present

## 2014-01-13 DIAGNOSIS — Z86718 Personal history of other venous thrombosis and embolism: Secondary | ICD-10-CM | POA: Insufficient documentation

## 2014-01-13 DIAGNOSIS — F17200 Nicotine dependence, unspecified, uncomplicated: Secondary | ICD-10-CM | POA: Diagnosis not present

## 2014-01-13 DIAGNOSIS — Z86711 Personal history of pulmonary embolism: Secondary | ICD-10-CM | POA: Insufficient documentation

## 2014-01-13 DIAGNOSIS — Z5181 Encounter for therapeutic drug level monitoring: Secondary | ICD-10-CM

## 2014-01-13 DIAGNOSIS — Z79899 Other long term (current) drug therapy: Secondary | ICD-10-CM

## 2014-01-13 DIAGNOSIS — M797 Fibromyalgia: Secondary | ICD-10-CM | POA: Insufficient documentation

## 2014-01-13 DIAGNOSIS — M25562 Pain in left knee: Secondary | ICD-10-CM

## 2014-01-13 DIAGNOSIS — M549 Dorsalgia, unspecified: Secondary | ICD-10-CM | POA: Insufficient documentation

## 2014-01-13 MED ORDER — DULOXETINE HCL 30 MG PO CPEP: 30.0000 mg | ORAL_CAPSULE | Freq: Every day | ORAL | Status: DC
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
## 2014-01-27 NOTE — Telephone Encounter (Signed)
Phone call - encounter closed. 

## 2014-02-07 ENCOUNTER — Other Ambulatory Visit: Payer: Self-pay | Admitting: *Deleted

## 2014-02-07 ENCOUNTER — Telehealth: Payer: Self-pay | Admitting: *Deleted

## 2014-02-07 ENCOUNTER — Other Ambulatory Visit: Payer: Medicaid Other

## 2014-02-07 ENCOUNTER — Ambulatory Visit: Payer: Medicaid Other

## 2014-02-07 NOTE — Telephone Encounter (Signed)
Pt was a no show for office visit today.  Called pt and was informed that pt forgot -  Stated  " I was in school, and didn't know " .  Informed pt that appt will be rescheduled.  Pt voiced understanding.

## 2014-02-10 ENCOUNTER — Telehealth: Payer: Self-pay | Admitting: Internal Medicine

## 2014-02-10 NOTE — Telephone Encounter (Signed)
Lft msg for pt confirming labs/ov per 08/21 POF, mailed sch to pt...KJ

## 2014-02-11 ENCOUNTER — Telehealth: Payer: Self-pay | Admitting: Internal Medicine

## 2014-02-13 NOTE — Telephone Encounter (Signed)
Encounter was telephone call. 

## 2014-02-14 ENCOUNTER — Other Ambulatory Visit: Payer: Medicaid Other

## 2014-02-14 ENCOUNTER — Ambulatory Visit: Payer: Medicaid Other

## 2014-03-03 ENCOUNTER — Encounter: Payer: Medicaid Other | Attending: Physical Medicine & Rehabilitation

## 2014-03-03 ENCOUNTER — Ambulatory Visit: Payer: Medicaid Other | Admitting: Physical Medicine & Rehabilitation

## 2014-03-03 DIAGNOSIS — F17200 Nicotine dependence, unspecified, uncomplicated: Secondary | ICD-10-CM | POA: Insufficient documentation

## 2014-03-03 DIAGNOSIS — IMO0001 Reserved for inherently not codable concepts without codable children: Secondary | ICD-10-CM | POA: Insufficient documentation

## 2014-03-03 DIAGNOSIS — Z86718 Personal history of other venous thrombosis and embolism: Secondary | ICD-10-CM | POA: Insufficient documentation

## 2014-03-03 DIAGNOSIS — M545 Low back pain, unspecified: Secondary | ICD-10-CM | POA: Insufficient documentation

## 2014-03-03 DIAGNOSIS — Z86711 Personal history of pulmonary embolism: Secondary | ICD-10-CM | POA: Insufficient documentation

## 2014-03-03 DIAGNOSIS — M549 Dorsalgia, unspecified: Secondary | ICD-10-CM | POA: Insufficient documentation

## 2014-03-03 DIAGNOSIS — G8929 Other chronic pain: Secondary | ICD-10-CM | POA: Insufficient documentation

## 2014-07-25 ENCOUNTER — Telehealth: Payer: Self-pay

## 2014-07-25 NOTE — Telephone Encounter (Signed)
error 

## 2014-12-31 ENCOUNTER — Encounter (HOSPITAL_COMMUNITY): Payer: Self-pay | Admitting: Emergency Medicine

## 2014-12-31 ENCOUNTER — Emergency Department (HOSPITAL_COMMUNITY)
Admission: EM | Admit: 2014-12-31 | Discharge: 2014-12-31 | Disposition: A | Discharge status: Home / Self Care | Payer: Medicaid Other | Source: Home / Self Care | Attending: Family Medicine | Admitting: Family Medicine

## 2014-12-31 DIAGNOSIS — B351 Tinea unguium: Secondary | ICD-10-CM | POA: Diagnosis not present

## 2014-12-31 DIAGNOSIS — L84 Corns and callosities: Secondary | ICD-10-CM | POA: Diagnosis not present

## 2014-12-31 DIAGNOSIS — I10 Essential (primary) hypertension: Secondary | ICD-10-CM | POA: Diagnosis not present

## 2014-12-31 LAB — HEPATIC FUNCTION PANEL
ALT: 19 U/L (ref 14–54)
AST: 19 U/L (ref 15–41)
Albumin: 4.2 g/dL (ref 3.5–5.0)
Alkaline Phosphatase: 57 U/L (ref 38–126)
Bilirubin, Direct: 0.2 mg/dL (ref 0.1–0.5)
Indirect Bilirubin: 0.8 mg/dL (ref 0.3–0.9)
Total Bilirubin: 1 mg/dL (ref 0.3–1.2)
Total Protein: 7.3 g/dL (ref 6.5–8.1)

## 2014-12-31 MED ORDER — TERBINAFINE HCL 250 MG PO TABS: 250.0000 mg | ORAL_TABLET | Freq: Every day | ORAL | Status: DC

## 2014-12-31 NOTE — ED Provider Notes (Signed)
CSN: [id number]     Arrival date & time 12/31/14  1515 History   None    Chief Complaint  Patient presents with  . Ingrown Toenail  . Callouses   (Consider location/radiation/quality/duration/timing/severity/associated sxs/prior Treatment) HPI  Bilateral great toes. Thickening of the toenails for years. Patient has tried over-the-counter antifungal medicines without improvement. Skin along the edges occasionally tender. No purulence. No swelling. Patient also with left pinky toe: Which is very tender to palpation. 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 Problem is getting worse. Problem is constant.    Past Medical History  Diagnosis Date  . DVT (deep venous thrombosis)   . Iron deficiency anemia   . PE (pulmonary embolism)   . Fibromyalgia   . Chronic back pain   . Benign tumor of abdominal wall   . Arthritis   . Migraines    Past Surgical History  Procedure Laterality Date  . Cesarean section    . Hernia repair    . Endometrial ablation     Family History  Problem Relation Age of Onset  . Cancer Mother   . Cancer Father   . Diabetes Sister   . Hypertension Sister    History  Substance Use Topics  . Smoking status: Current Every Day Smoker  . Smokeless tobacco: Never Used  . Alcohol Use: No   OB History    No data available     Review of Systems Per HPI with all other pertinent systems negative.   Allergies  Gabapentin  Home Medications   Prior to Admission medications   Medication Sig Start Date End Date Taking? Authorizing Provider  alprazolam Duanne Moron) 2 MG tablet Take 2 mg by mouth at bedtime as needed for sleep or anxiety. Can take up to three times a day    Historical Provider, MD  amLODipine-olmesartan (AZOR) 10-40 MG per tablet Take 1 tablet by mouth daily.    Historical Provider, MD  cyclobenzaprine (FLEXERIL) 10 MG  tablet Take 1 tablet (10 mg total) by mouth 2 (two) times daily as needed for muscle spasms. 10/08/13   Montine Circle, PA-C  DULoxetine (CYMBALTA) 30 MG capsule Take 1 capsule (30 mg total) by mouth daily. 01/13/14   Charlett Blake, MD  LORazepam (ATIVAN) 2 MG tablet Take 2 mg by mouth at bedtime.    Historical Provider, MD  oxycodone (ROXICODONE) 30 MG immediate release tablet Take 30 mg by mouth every 4 (four) hours as needed. For pain.    Historical Provider, MD  terbinafine (LAMISIL) 250 MG tablet Take 1 tablet (250 mg total) by mouth daily. 12/31/14   Waldemar Dickens, MD   BP 166/92 mmHg  Pulse 70  Temp(Src) 98.3 F (36.8 C) (Oral)  SpO2 100% Physical Exam Physical Exam  Constitutional: oriented to person, place, and time. appears well-developed and well-nourished. No distress.  HENT:  Head: Normocephalic and atraumatic.  Eyes: EOMI. PERRL.  Neck: Normal range of motion.  Cardiovascular: RRR, no m/r/g, 2+ distal pulses,  Pulmonary/Chest: Effort normal and breath sounds normal. No respiratory distress.  Abdominal: Soft. Bowel sounds are normal. NonTTP, no distension.  Musculoskeletal: Normal range of motion. Non ttp, no effusion.  Neurological: alert and oriented to person, place, and time.  Skin: Thickened and onychomycotic great toes bilaterally. Left lateral aspect of acute with thickened callus formation.Marland Kitchen  Psychiatric:  normal mood and affect. behavior is normal. Judgment and thought content normal.    ED Course  Procedures (including critical care time) Labs Review Labs Reviewed  HEPATIC FUNCTION PANEL    Imaging Review No results found.   MDM   1. Onychomycosis   2. Essential hypertension   3. Corn of toe    Reviewed labs and all previous liver function panel tests were normal. Repeat hepatic function tests and start terbinafine 250 mg once daily. Reiterated importance of patient following with her primary care physician she will likely need longer therapy of  this medicine but will need follow-up hepatic function testing in order to do so. Alternative is for patient to use home remedies such as Vicks VapoRub twice a day and to avoid using toenail polish. Patient to use an Tax adviser and vitamin E ointment or lotion as well as moleskin over the left pinky toe corn to help reduce the size of it over time. Patient's blood pressure is elevated though patient is in pain this morning which is likely part of the problem.    Waldemar Dickens, MD 12/31/14 [phone]

## 2014-12-31 NOTE — ED Notes (Signed)
Pt has been trying to treat her ingrown toenails on both great toes for 6 months.  The pain has just gotten too unbearable for her to handle because she works on her feet 12 hours a day.  She also has a corn on her left pinky toe.

## 2014-12-31 NOTE — Discharge Instructions (Signed)
Your toenails are infected with fungus.  This will be best treated with an antifungal medicine. You will need to get your liver checked in 4-6 weeks if you continue using the medicine The alternate therapy is to use VIck's Vapor rub on your toenails twice a day Please use an emory board on your little toe and use vitamin E lotion or ointmnet to gently rub the area Please also consider using mole skin to protect the area.

## 2015-08-11 ENCOUNTER — Encounter (INDEPENDENT_AMBULATORY_CARE_PROVIDER_SITE_OTHER): Payer: Medicaid Other | Admitting: Podiatry

## 2015-08-11 DIAGNOSIS — L600 Ingrowing nail: Secondary | ICD-10-CM

## 2015-08-11 NOTE — Progress Notes (Signed)
This encounter was created in error - please disregard.

## 2016-03-02 ENCOUNTER — Emergency Department (HOSPITAL_COMMUNITY): Payer: Medicaid Other

## 2016-03-02 ENCOUNTER — Emergency Department (HOSPITAL_BASED_OUTPATIENT_CLINIC_OR_DEPARTMENT_OTHER)
Admit: 2016-03-02 | Discharge: 2016-03-02 | Disposition: A | Discharge status: Home / Self Care | Payer: Medicaid Other | Attending: Emergency Medicine | Admitting: Emergency Medicine

## 2016-03-02 ENCOUNTER — Emergency Department (HOSPITAL_COMMUNITY)
Admission: EM | Admit: 2016-03-02 | Discharge: 2016-03-02 | Disposition: A | Discharge status: Home / Self Care | Payer: Medicaid Other | Attending: Emergency Medicine | Admitting: Emergency Medicine

## 2016-03-02 ENCOUNTER — Encounter (HOSPITAL_COMMUNITY): Payer: Self-pay

## 2016-03-02 DIAGNOSIS — M79605 Pain in left leg: Secondary | ICD-10-CM

## 2016-03-02 DIAGNOSIS — M79609 Pain in unspecified limb: Secondary | ICD-10-CM

## 2016-03-02 DIAGNOSIS — R079 Chest pain, unspecified: Secondary | ICD-10-CM

## 2016-03-02 DIAGNOSIS — J069 Acute upper respiratory infection, unspecified: Secondary | ICD-10-CM | POA: Insufficient documentation

## 2016-03-02 DIAGNOSIS — R0789 Other chest pain: Secondary | ICD-10-CM | POA: Insufficient documentation

## 2016-03-02 DIAGNOSIS — F17200 Nicotine dependence, unspecified, uncomplicated: Secondary | ICD-10-CM | POA: Insufficient documentation

## 2016-03-02 DIAGNOSIS — Z79899 Other long term (current) drug therapy: Secondary | ICD-10-CM | POA: Insufficient documentation

## 2016-03-02 DIAGNOSIS — R0602 Shortness of breath: Secondary | ICD-10-CM | POA: Diagnosis present

## 2016-03-02 LAB — PROTIME-INR
INR: 0.91
Prothrombin Time: 12.2 seconds (ref 11.4–15.2)

## 2016-03-02 LAB — CBC
HCT: 39.6 % (ref 36.0–46.0)
Hemoglobin: 13.2 g/dL (ref 12.0–15.0)
MCH: 28.4 pg (ref 26.0–34.0)
MCHC: 33.3 g/dL (ref 30.0–36.0)
MCV: 85.2 fL (ref 78.0–100.0)
Platelets: 296 10*3/uL (ref 150–400)
RBC: 4.65 MIL/uL (ref 3.87–5.11)
RDW: 14 % (ref 11.5–15.5)
WBC: 6.4 10*3/uL (ref 4.0–10.5)

## 2016-03-02 LAB — BASIC METABOLIC PANEL
Anion gap: 6 (ref 5–15)
BUN: 9 mg/dL (ref 6–20)
CO2: 25 mmol/L (ref 22–32)
Calcium: 9 mg/dL (ref 8.9–10.3)
Chloride: 104 mmol/L (ref 101–111)
Creatinine, Ser: 0.67 mg/dL (ref 0.44–1.00)
GFR calc Af Amer: >60 mL/min (ref >60)
GFR calc non Af Amer: >60 mL/min (ref >60)
Glucose, Bld: 79 mg/dL (ref 65–99)
Potassium: 3.7 mmol/L (ref 3.5–5.1)
Sodium: 135 mmol/L (ref 135–145)

## 2016-03-02 LAB — I-STAT TROPONIN, ED
Troponin i, poc: 0 ng/mL (ref 0.00–0.08)
Troponin i, poc: 0 ng/mL (ref 0.00–0.08)

## 2016-03-02 MED ORDER — KETOROLAC TROMETHAMINE 30 MG/ML IJ SOLN
30.0000 mg | Freq: Once | INTRAMUSCULAR | Status: AC
  Administered 2016-03-02: 30 mg via INTRAVENOUS
  Filled 2016-03-02: qty 1

## 2016-03-02 MED ORDER — CYCLOBENZAPRINE HCL 10 MG PO TABS: 10.0000 mg | ORAL_TABLET | Freq: Three times a day (TID) | ORAL | 0 refills | Status: AC | PRN

## 2016-03-02 MED ORDER — CYCLOBENZAPRINE HCL 10 MG PO TABS
10.0000 mg | ORAL_TABLET | Freq: Once | ORAL | Status: AC
  Administered 2016-03-02: 10 mg via ORAL
  Filled 2016-03-02: qty 1

## 2016-03-02 MED ORDER — IOPAMIDOL (ISOVUE-370) INJECTION 76%
100.0000 mL | Freq: Once | INTRAVENOUS | Status: AC | PRN
  Administered 2016-03-02: 100 mL via INTRAVENOUS

## 2016-03-02 MED ORDER — MORPHINE SULFATE (PF) 4 MG/ML IV SOLN
4.0000 mg | Freq: Once | INTRAVENOUS | Status: AC
  Administered 2016-03-02: 4 mg via INTRAVENOUS
  Filled 2016-03-02: qty 1

## 2016-03-02 MED ORDER — ONDANSETRON HCL 4 MG/2ML IJ SOLN
4.0000 mg | Freq: Once | INTRAMUSCULAR | Status: AC
  Administered 2016-03-02: 4 mg via INTRAVENOUS
  Filled 2016-03-02: qty 2

## 2016-03-02 MED ORDER — SODIUM CHLORIDE 0.9 % IV BOLUS (SEPSIS)
1000.0000 mL | Freq: Once | INTRAVENOUS | Status: AC
  Administered 2016-03-02: 1000 mL via INTRAVENOUS

## 2016-03-02 NOTE — ED Notes (Signed)
Bed: [id number] Expected date:  Expected time:  Means of arrival:  Comments: TR1

## 2016-03-02 NOTE — ED Notes (Signed)
Bed: [id number] Expected date:  Expected time:  Means of arrival:  Comments: TR4

## 2016-03-02 NOTE — ED Notes (Signed)
Patient transported to CT 

## 2016-03-02 NOTE — ED Provider Notes (Signed)
[facility] DEPT Provider Note   CSN: [id number] Arrival date & time: 03/02/16  1252     History   Chief Complaint Chief Complaint  Patient presents with  . Shortness of Breath    HPI Maria [name] is a 50 y.o. female.  HPI  Left sided chest pain began last night.  Last night turning over in bed and hurt to take deep breaths  Stabbing pain. Right side of back feels like being kickec by little people but left side feels like strong pressure/stabbing left breast radiates around the side to the middle of the back. Worse with deep breaths and movements. Not sure it feels like prior PE.  Last night feeling short of breath and today a little bit.  Was on coumadin--lovenox--xarelto then stopped taking it because of concern for side effects. A few years ago was last PE.    Past Medical History:  Diagnosis Date  . Arthritis   . Benign tumor of abdominal wall   . Chronic back pain   . DVT (deep venous thrombosis) ([place])   . Fibromyalgia   . Iron deficiency anemia   . Migraines   . PE (pulmonary embolism)     Patient Active Problem List   Diagnosis Date Noted  . Fibromyalgia muscle pain 01/13/2014  . Chronic back pain 01/13/2014  . Iron deficiency anemia 11/15/2011  . Pulmonary embolism, bilateral ([place]) 06/03/2011    Past Surgical History:  Procedure Laterality Date  . CESAREAN SECTION    . ENDOMETRIAL ABLATION    . HERNIA REPAIR      OB History    No data available       Home Medications    Prior to Admission medications   Medication Sig Start Date End Date Taking? Authorizing Provider  alprazolam Duanne Moron) 2 MG tablet Take 2 mg by mouth at bedtime as needed for sleep or anxiety. Can take up to three times a day   Yes Historical Provider, MD  amLODipine (NORVASC) 5 MG tablet Take 5 mg by mouth daily.   Yes Historical Provider, MD  indomethacin (INDOCIN) 50 MG capsule Take 50 mg by mouth 3 (three) times daily as needed for pain. 12/04/15  Yes Historical Provider, MD    tiZANidine (ZANAFLEX) 4 MG tablet Take 4 mg by mouth 2 (two) times daily as needed for pain. 12/04/15  Yes Historical Provider, MD  amLODipine-olmesartan (AZOR) 10-40 MG per tablet Take 1 tablet by mouth daily.    Historical Provider, MD  DULoxetine (CYMBALTA) 30 MG capsule Take 1 capsule (30 mg total) by mouth daily. Patient not taking: Reported on 03/02/2016 01/13/14   Charlett Blake, MD  LORazepam (ATIVAN) 2 MG tablet Take 2 mg by mouth at bedtime.    Historical Provider, MD    Family History Family History  Problem Relation Age of Onset  . Cancer Mother   . Cancer Father   . Diabetes Sister   . Hypertension Sister     Social History Social History  Substance Use Topics  . Smoking status: Current Every Day Smoker  . Smokeless tobacco: Never Used  . Alcohol use No     Allergies   Gabapentin   Review of Systems Review of Systems  Constitutional: Negative for fever.  HENT: Positive for congestion. Negative for sore throat.   Eyes: Negative for visual disturbance.  Respiratory: Positive for cough and shortness of breath.   Cardiovascular: Positive for chest pain.  Gastrointestinal: Positive for nausea. Negative for abdominal pain, constipation,  diarrhea and vomiting.  Genitourinary: Negative for difficulty urinating.  Musculoskeletal: Positive for arthralgias (left leg pain, going on for last few months). Negative for back pain and neck pain.  Skin: Negative for rash.  Neurological: Negative for syncope and headaches.     Physical Exam Updated Vital Signs BP 153/90   Pulse (!) 59   Temp 97.6 F (36.4 C) (Oral)   Resp 18   SpO2 100%   Physical Exam  Constitutional: She is oriented to person, place, and time. She appears well-developed and well-nourished. No distress.  HENT:  Head: Normocephalic and atraumatic.  Eyes: Conjunctivae and EOM are normal.  Neck: Normal range of motion.  Cardiovascular: Normal rate, regular rhythm, normal heart sounds and intact  distal pulses.  Exam reveals no gallop and no friction rub.   No murmur heard. Pulmonary/Chest: Effort normal and breath sounds normal. No respiratory distress. She has no wheezes. She has no rales. She exhibits tenderness.  Abdominal: Soft. She exhibits no distension. There is no tenderness. There is no guarding.  Musculoskeletal: She exhibits no edema or tenderness.  Left lateral back tenderness  Neurological: She is alert and oriented to person, place, and time.  Skin: Skin is warm and dry. No rash noted. She is not diaphoretic. No erythema.  Nursing note and vitals reviewed.    ED Treatments / Results  Labs (all labs ordered are listed, but only abnormal results are displayed) Labs Reviewed  BASIC METABOLIC PANEL  CBC  PROTIME-INR  I-STAT [place], ED  I-STAT [place], ED    EKG  EKG Interpretation  Date/Time:  Wednesday March 02 2016 13:11:10 EDT Ventricular Rate:  62 PR Interval:    QRS Duration: 90 QT Interval:  396 QTC Calculation: 403 R Axis:   31 Text Interpretation:  Sinus rhythm Anterior infarct, old No significant change since last tracing Confirmed by [facility] MD, Pal[place] ([id number]) on 03/02/2016 1:25:04 PM Also confirmed by [facility] MD, Chante Mayson ([id number]), editor [place], Joelene Millin [phone])  on 03/02/2016 1:42:18 PM       Radiology Dg Chest 2 View  Result Date: 03/02/2016 CLINICAL DATA:  Chest pain and shortness of breath EXAM: CHEST  2 VIEW COMPARISON:  11/09/2010 chest radiograph. FINDINGS: Stable cardiomediastinal silhouette with top-normal heart size. No pneumothorax. No pleural effusion. Lungs appear clear, with no acute consolidative airspace disease and no pulmonary edema. IMPRESSION: No active cardiopulmonary disease. Electronically Signed   By: Ilona Sorrel M.D.   On: 03/02/2016 13:56   Ct Angio Chest Pe W And/or Wo Contrast  Result Date: 03/02/2016 CLINICAL DATA:  Shortness of breath with left-sided chest pain. EXAM: CT ANGIOGRAPHY CHEST WITH CONTRAST  TECHNIQUE: Multidetector CT imaging of the chest was performed using the standard protocol during bolus administration of intravenous contrast. Multiplanar CT image reconstructions and MIPs were obtained to evaluate the vascular anatomy. CONTRAST:  100 mL Isovue 370 COMPARISON:  07/14/2011 FINDINGS: Cardiovascular: Negative for pulmonary embolism. Normal caliber of the thoracic aorta. Heart size is normal. Mediastinum/Nodes: No chest lymphadenopathy. No significant pericardial fluid. Small lymph nodes in the right supraclavicular area are nonspecific. Lungs/Pleura: Trachea and mainstem bronchi are patent. Dependent atelectasis in both lower lobes. No significant airspace disease or consolidation. Upper Abdomen: No acute abnormality in the visualized upper abdominal structures. Musculoskeletal: No acute bone abnormality. Review of the MIP images confirms the above findings. IMPRESSION: Negative for pulmonary embolism. No acute chest abnormalities. Lungs are clear except for some dependent atelectasis. Electronically Signed   By: Scherrie Gerlach.D.  On: 03/02/2016 16:02    Procedures Procedures (including critical care time)  Medications Ordered in ED Medications  cyclobenzaprine (FLEXERIL) tablet 10 mg (not administered)  ketorolac (TORADOL) 30 MG/ML injection 30 mg (not administered)  morphine 4 MG/ML injection 4 mg (4 mg Intravenous Given 03/02/16 1522)  ondansetron (ZOFRAN) injection 4 mg (4 mg Intravenous Given 03/02/16 1522)  sodium chloride 0.9 % bolus 1,000 mL (1,000 mLs Intravenous New Bag/Given 03/02/16 1535)  iopamidol (ISOVUE-370) 76 % injection 100 mL (100 mLs Intravenous Contrast Given 03/02/16 1544)     Initial Impression / Assessment and Plan / ED Course  I have reviewed the triage vital signs and the nursing notes.  Pertinent labs & imaging results that were available during my care of the patient were reviewed by me and considered in my medical decision making (see chart for  details).  Clinical Course   50 year old female with a history of DVT, PE, fibromyalgia, arthritis presents with concern of left-sided chest pain. Differential diagnosis for chest pain includes pulmonary embolus, dissection, pneumothorax, pneumonia, ACS, myocarditis, pericarditis.  EKG was done and evaluate by me and showed no acute ST changes and no signs of pericarditis. Chest x-ray was done and evaluated by me and radiology and showed no sign of pneumonia or pneumothorax.  Given patient's leg pain, free of PE and DVT, as well as chest pain and dyspnea, will order ultrasound and CT PE study. Patient is low risk HEART score and had delta troponins which were both negative. Given this evaluation, history and physical have low suspicion for pulmonary embolus, pneumonia, ACS, myocarditis, pericarditis, dissection.   Patient most likely with musculoskeletal chest pain given pain with palpation. Gave rx for flexeril, recommend ibuprofen/tylenol.  Patient reports she self discontinued her Xarelto given concern of side effects, however PE was greater than one year ago and given unclear reason she had been continued on xarelto and no sign of thrombosis today recommend PCP follow up. Patient discharged in stable condition with understanding of reasons to return and recommend PCP follow up.   Final Clinical Impressions(s) / ED Diagnoses   Final diagnoses:  Chest pain, unspecified chest pain type  URI (upper respiratory infection)  Chest wall pain  Left leg pain    New Prescriptions New Prescriptions   No medications on file     Gareth Morgan, MD 03/02/16 2116

## 2016-03-02 NOTE — ED Notes (Signed)
MD at bedside. EDP SCHLOSSMAN IN TO SEE AND UPDATE PT

## 2016-03-02 NOTE — ED Triage Notes (Signed)
Pt c/o sob, cp and left sided rib cage pain that started last night. Pt A+O.

## 2016-03-02 NOTE — ED Notes (Signed)
VE PRESENT AT BEDSIDE

## 2016-03-02 NOTE — Discharge Instructions (Signed)
Take 400 mg Ibuprofen and 500 mg of Tylenol together every 4 hours for pain.

## 2016-03-02 NOTE — ED Notes (Signed)
MD at bedside. EDP PRESENT TO UPDATE PT

## 2016-03-02 NOTE — ED Notes (Signed)
EDP SCHLOSSMAN AWARE OF I STAT TROPONIN RESULTS. VERBAL ORDER TO DISCONTINUE LAB FOR 1640.

## 2016-03-02 NOTE — Progress Notes (Signed)
*  PRELIMINARY RESULTS* Vascular Ultrasound Left lower extremity venous duplex has been completed.  Preliminary findings: No evidence of DVT or baker's cyst.  Landry Mellow, RDMS, RVT  03/02/2016, 3:32 PM

## 2016-08-13 ENCOUNTER — Emergency Department (HOSPITAL_COMMUNITY)
Admission: EM | Admit: 2016-08-13 | Discharge: 2016-08-13 | Disposition: A | Discharge status: Home / Self Care | Payer: Medicaid Other | Attending: Emergency Medicine | Admitting: Emergency Medicine

## 2016-08-13 ENCOUNTER — Encounter (HOSPITAL_COMMUNITY): Payer: Self-pay | Admitting: Emergency Medicine

## 2016-08-13 ENCOUNTER — Emergency Department (HOSPITAL_BASED_OUTPATIENT_CLINIC_OR_DEPARTMENT_OTHER)
Admit: 2016-08-13 | Discharge: 2016-08-13 | Disposition: A | Discharge status: Home / Self Care | Payer: Medicaid Other | Attending: Emergency Medicine | Admitting: Emergency Medicine

## 2016-08-13 DIAGNOSIS — M79605 Pain in left leg: Secondary | ICD-10-CM | POA: Diagnosis not present

## 2016-08-13 DIAGNOSIS — G8929 Other chronic pain: Secondary | ICD-10-CM | POA: Diagnosis present

## 2016-08-13 DIAGNOSIS — M7122 Synovial cyst of popliteal space [Baker], left knee: Secondary | ICD-10-CM | POA: Diagnosis not present

## 2016-08-13 DIAGNOSIS — Z79899 Other long term (current) drug therapy: Secondary | ICD-10-CM | POA: Diagnosis not present

## 2016-08-13 DIAGNOSIS — M79609 Pain in unspecified limb: Secondary | ICD-10-CM

## 2016-08-13 DIAGNOSIS — F17200 Nicotine dependence, unspecified, uncomplicated: Secondary | ICD-10-CM | POA: Insufficient documentation

## 2016-08-13 DIAGNOSIS — M545 Low back pain, unspecified: Secondary | ICD-10-CM

## 2016-08-13 DIAGNOSIS — G43909 Migraine, unspecified, not intractable, without status migrainosus: Secondary | ICD-10-CM | POA: Diagnosis not present

## 2016-08-13 DIAGNOSIS — G43009 Migraine without aura, not intractable, without status migrainosus: Secondary | ICD-10-CM

## 2016-08-13 MED ORDER — DIPHENHYDRAMINE HCL 50 MG/ML IJ SOLN
25.0000 mg | Freq: Once | INTRAMUSCULAR | Status: AC
  Administered 2016-08-13: 25 mg via INTRAVENOUS
  Filled 2016-08-13: qty 1

## 2016-08-13 MED ORDER — METOCLOPRAMIDE HCL 5 MG/ML IJ SOLN
10.0000 mg | Freq: Once | INTRAMUSCULAR | Status: AC
  Administered 2016-08-13: 10 mg via INTRAVENOUS
  Filled 2016-08-13: qty 2

## 2016-08-13 MED ORDER — KETOROLAC TROMETHAMINE 15 MG/ML IJ SOLN
15.0000 mg | Freq: Once | INTRAMUSCULAR | Status: AC
  Administered 2016-08-13: 15 mg via INTRAVENOUS
  Filled 2016-08-13: qty 1

## 2016-08-13 NOTE — ED Provider Notes (Signed)
[facility] DEPT Provider Note   CSN: [id number] Arrival date & time: 08/13/16  [id number]   By signing my name below, I, Soijett Blue, attest that this documentation has been prepared under the direction and in the presence of Varney Biles, MD. Electronically Signed: Soijett Blue, ED Scribe. 08/13/16. 3:26 AM.  History   Chief Complaint Chief Complaint  Patient presents with  . Back Pain  . Knee Pain  . Migraine    HPI Maria [name] is a 51 y.o. female with a PMHx of chronic back pain, who presents to the Emergency Department with multiple complaints, complaining of lower back pain onset 3 days ago. Pt reports that her lower back pain is worsened with ambulation, prolonged sitting, or laying down. Pt hasn't tried any medications for her lower back pain. She denies dysuria, hematuria, bowel/bladder incontinence, and any other symptoms.   Pt secondarily complains of a headache onset 3 days ago. She notes that this HA is similar to headaches that she has had in the past. Pt reports associated nausea. Pt reports that she typically takes sumatriptan for her migraines, but notes that she hasn't tried any medications PTA. Denies numbness, tingling, and any other symptoms.  Pt thirdly complains of throbbing left leg pain x 3 days ago. Pt reports associated LLE swelling. Pt notes that her left leg pain radiates to her left knee. She states that she has had two occurrences of blood clots (DVT and PE) with her last being 6 years ago. Pt hasn't tried any medications for her symptoms. Pt denies leg trauma and any other symptoms. Pt denies recent travel, immobilization, surgery, or PMHx of CA.    The history is provided by the patient. No language interpreter was used.    Past Medical History:  Diagnosis Date  . Arthritis   . Benign tumor of abdominal wall   . Chronic back pain   . DVT (deep venous thrombosis) ([place])   . Fibromyalgia   . Iron deficiency anemia   . Migraines   . PE (pulmonary  embolism)     Patient Active Problem List   Diagnosis Date Noted  . Fibromyalgia muscle pain 01/13/2014  . Chronic back pain 01/13/2014  . Iron deficiency anemia 11/15/2011  . Pulmonary embolism, bilateral ([place]) 06/03/2011    Past Surgical History:  Procedure Laterality Date  . CESAREAN SECTION    . ENDOMETRIAL ABLATION    . HERNIA REPAIR      OB History    No data available       Home Medications    Prior to Admission medications   Medication Sig Start Date End Date Taking? Authorizing Provider  alprazolam Duanne Moron) 2 MG tablet Take 2 mg by mouth at bedtime as needed for sleep or anxiety. Can take up to three times a day    Historical Provider, MD  amLODipine (NORVASC) 5 MG tablet Take 5 mg by mouth daily.    Historical Provider, MD  amLODipine-olmesartan (AZOR) 10-40 MG per tablet Take 1 tablet by mouth daily.    Historical Provider, MD  DULoxetine (CYMBALTA) 30 MG capsule Take 1 capsule (30 mg total) by mouth daily. Patient not taking: Reported on 03/02/2016 01/13/14   Charlett Blake, MD  indomethacin (INDOCIN) 50 MG capsule Take 50 mg by mouth 3 (three) times daily as needed for pain. 12/04/15   Historical Provider, MD  LORazepam (ATIVAN) 2 MG tablet Take 2 mg by mouth at bedtime.    Historical Provider, MD  tiZANidine (ZANAFLEX)  4 MG tablet Take 4 mg by mouth 2 (two) times daily as needed for pain. 12/04/15   Historical Provider, MD    Family History Family History  Problem Relation Age of Onset  . Cancer Mother   . Cancer Father   . Diabetes Sister   . Hypertension Sister     Social History Social History  Substance Use Topics  . Smoking status: Current Every Day Smoker  . Smokeless tobacco: Never Used  . Alcohol use No     Allergies   Gabapentin   Review of Systems Review of Systems A complete 10 system review of systems was obtained and all systems are negative except as noted in the HPI and PMH.    Physical Exam Updated Vital Signs BP 104/60  (BP Location: Left Arm)   Pulse 69   Temp 98.2 F (36.8 C) (Oral)   Resp 16   Ht 5\' 5"  (1.651 m)   Wt 130 lb (59 kg)   SpO2 97%   BMI 21.63 kg/m   Physical Exam  Constitutional: She is oriented to person, place, and time. She appears well-developed and well-nourished. No distress.  HENT:  Head: Normocephalic and atraumatic.  Eyes: EOM are normal. Right eye exhibits no nystagmus. Left eye exhibits no nystagmus.  Pupils are 3 mm and equal. No nystagmus.  Neck: Neck supple.  Cardiovascular: Regular rhythm and normal heart sounds.  Tachycardia present.  Exam reveals no gallop and no friction rub.   No murmur heard. Pulses:      Dorsalis pedis pulses are 2+ on the right side, and 2+ on the left side.  Pulmonary/Chest: Effort normal and breath sounds normal. No respiratory distress. She has no wheezes. She has no rales.  Abdominal: Soft. She exhibits no distension. There is no tenderness.  Musculoskeletal: Normal range of motion.       Lumbar back: She exhibits tenderness and bony tenderness.       Left lower leg: She exhibits swelling.  Localized tenderness over the entire lumbar spine and left paraspinal and flank region.  LLE with unilateral calf swelling and TTP. LLE appears mildly swollen compared to RLE. 2+ DP pulses bilaterally and equal. Skin has same warmth to touch as contralateral side and there is no erythema.   Neurological: She is alert and oriented to person, place, and time.  Cranial nerves 2-12 intact.  Skin: Skin is warm and dry. No erythema.  Psychiatric: She has a normal mood and affect. Her behavior is normal.  Nursing note and vitals reviewed.    ED Treatments / Results  DIAGNOSTIC STUDIES: Oxygen Saturation is 99% on RA, nl by my interpretation.    COORDINATION OF CARE: 3:13 AM Discussed treatment plan with pt at bedside which includes labs, outpatient [place], and pt agreed to plan.   Labs (all labs ordered are listed, but only abnormal results are  displayed) Labs Reviewed - No data to display  EKG  EKG Interpretation None       Radiology No results found.  Procedures Procedures (including critical care time)  Medications Ordered in ED Medications  ketorolac (TORADOL) 15 MG/ML injection 15 mg (15 mg Intravenous Given 08/13/16 0414)  metoCLOPramide (REGLAN) injection 10 mg (10 mg Intravenous Given 08/13/16 0414)  diphenhydrAMINE (BENADRYL) injection 25 mg (25 mg Intravenous Given 08/13/16 0414)     Initial Impression / Assessment and Plan / ED Course  I have reviewed the triage vital signs and the nursing notes.  Pertinent labs & imaging  results that were available during my care of the patient were reviewed by me and considered in my medical decision making (see chart for details).    Pt comes in with cc of back pain - it is chronic, there is likely some spasms. Pt has no associated numbness, weakness, urinary incontinence, urinary retention, bowel incontinence, pins and needle sensation in the perineal area. We dont think there is a new fracture. Will give toradol.  Pt has headache - migraine type. Non focal neuro exam. Will give toradol and reglan.  Pt will get US DVT for her LLE pain. Hx of DVT/PE and has unilateral pain,   Final Clinical Impressions(s) / ED Diagnoses   Final diagnoses:  Migraine without aura and without status migrainosus, not intractable  Chronic left-sided low back pain without sciatica  Pain of left lower extremity    New Prescriptions New Prescriptions   No medications on file   I personally performed the services described in this documentation, which was scribed in my presence. The recorded information has been reviewed and is accurate.     Varney Biles, MD 08/13/16 [phone]

## 2016-08-13 NOTE — ED Triage Notes (Signed)
Patient is complaining of a migraine, back pain, and left knee pain. Patient states left knee is swollen. Patient states she has not gotten any sleep for the last 2 1/2 days due to the knee throbbing and not letting her sleep.

## 2016-08-13 NOTE — Progress Notes (Signed)
*  PRELIMINARY RESULTS* Vascular Ultrasound Left lower extremity venous duplex has been completed.  Preliminary findings: No evidence of deep vein thrombosis in the left lower extremity, tiny baker's cysts seen left popliteal fossa.  Preliminary results given to doctor @ 8:05.   Everrett Coombe 08/13/2016, 8:08 AM

## 2016-08-13 NOTE — Discharge Instructions (Signed)
Ultrasound shows BAKER'S CYST - but no DVT. Please take over the counter meds for headache and back pain.

## 2016-08-13 NOTE — ED Notes (Signed)
PATIENT UNABLE TO PROVIDE  A URINE SAMPLE AT THIS TIME

## 2016-09-07 ENCOUNTER — Ambulatory Visit (INDEPENDENT_AMBULATORY_CARE_PROVIDER_SITE_OTHER): Payer: Medicaid Other | Admitting: Orthopaedic Surgery

## 2016-10-03 ENCOUNTER — Ambulatory Visit (INDEPENDENT_AMBULATORY_CARE_PROVIDER_SITE_OTHER): Payer: Medicaid Other | Admitting: Orthopaedic Surgery

## 2017-01-19 ENCOUNTER — Other Ambulatory Visit: Payer: Self-pay | Admitting: Internal Medicine

## 2017-01-19 DIAGNOSIS — Z1231 Encounter for screening mammogram for malignant neoplasm of breast: Secondary | ICD-10-CM

## 2017-01-24 ENCOUNTER — Ambulatory Visit: Payer: Medicaid Other

## 2017-02-09 ENCOUNTER — Ambulatory Visit: Payer: Medicaid Other

## 2017-02-21 ENCOUNTER — Other Ambulatory Visit: Payer: Self-pay | Admitting: Internal Medicine

## 2017-02-21 DIAGNOSIS — E2839 Other primary ovarian failure: Secondary | ICD-10-CM

## 2017-03-07 ENCOUNTER — Other Ambulatory Visit: Payer: Self-pay

## 2017-03-07 ENCOUNTER — Ambulatory Visit: Payer: Self-pay

## 2017-04-10 ENCOUNTER — Ambulatory Visit (INDEPENDENT_AMBULATORY_CARE_PROVIDER_SITE_OTHER): Payer: Medicaid Other | Admitting: Orthopaedic Surgery

## 2017-04-10 ENCOUNTER — Ambulatory Visit (INDEPENDENT_AMBULATORY_CARE_PROVIDER_SITE_OTHER): Payer: Medicaid Other

## 2017-04-10 DIAGNOSIS — M25561 Pain in right knee: Secondary | ICD-10-CM | POA: Diagnosis not present

## 2017-04-10 DIAGNOSIS — M25562 Pain in left knee: Secondary | ICD-10-CM

## 2017-04-10 DIAGNOSIS — G8929 Other chronic pain: Secondary | ICD-10-CM | POA: Diagnosis not present

## 2017-04-10 MED ORDER — NABUMETONE 500 MG PO TABS: 500.0000 mg | ORAL_TABLET | Freq: Two times a day (BID) | ORAL | 3 refills | Status: DC | PRN

## 2017-04-10 NOTE — Progress Notes (Signed)
Office Visit Note   Patient: Maria [name]           Date of Birth: 1965-09-18           MRN: [id number] Visit Date: 04/10/2017              Requested by: Nolene Ebbs, MD [address] [place], [place] [address] PCP: Nolene Ebbs, MD   Assessment & Plan: Visit Diagnoses:  1. Chronic pain of both knees     Plan: At this point she is been dealing with bilateral knee pain for years with the left worse than the right. She says she is at the point where it is detrimentally affecting her activities daily living, her quality of life, her mobility. She's been on anti-inflammatories and activity modification. She is not overweight. 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 With that being said at this point, have her start a supplement Tumeric and since meloxicam is not working for her we'll try Relafen. Also would like to obtain an MRI of her left knee to rule out any type of cartilage tear or other pathology that could be causing the severe knee pain. She will come back and see [place] once the MRI is obtained. There is nothing else that I have to offer as of now until we see what the MRI shows. All questions and concerns were answered and addressed.  Follow-Up Instructions: No Follow-up on file.  She will call for follow-up once the MRI is obtained of her left knee. Orders:  Orders Placed This Encounter  Procedures  . XR Knee 1-2 Views Left  . XR Knee 1-2 Views Right   Meds ordered this encounter  Medications  . nabumetone (RELAFEN) 500 MG tablet    Sig: Take 1 tablet (500 mg total) by mouth 2 (two) times daily as needed.    Dispense:  60 tablet    Refill:  3      Procedures: No procedures performed   Clinical Data: No additional findings.   Subjective: No chief complaint on file. The patient is a 51 year old female with bilateral knee pain with the left worse than right for many years now. She's been on  anti-inflammatories. She is not obese at all. She has tried activity modification as well. Her pain is 10 out of 10 at times. She unfortunately has a phobia the needles and passed out at one point prior to having steroid injection in her knee.  HPI  Review of Systems She denies any headache, chest pain, shorter breath, fever, chills, nausea, vomiting.  Objective: Vital Signs: There were no vitals taken for this visit.  Physical Exam She is alert and oriented 3 and in no acute distress Ortho Exam Examination of both knees show slight effusion. Both knees are ligamentously stable with good range of motion. Both knees have a painful arc of motion. Specialty Comments:  No specialty comments available.  Imaging: Xr Knee 1-2 Views Left  Result Date: 04/10/2017 An AP and lateral of her knee shows well maintained joint space with no acute findings.  Xr Knee 1-2 Views Right  Result Date: 04/10/2017 2 views of the right knee show well-maintained joint space with normal alignment and no acute findings.    PMFS History: Patient Active Problem List   Diagnosis Date Noted  . Fibromyalgia muscle pain 01/13/2014  . Chronic back pain 01/13/2014  . Iron deficiency anemia 11/15/2011  . Pulmonary embolism,  bilateral ([place]) 06/03/2011   Past Medical History:  Diagnosis Date  . Arthritis   . Benign tumor of abdominal wall   . Chronic back pain   . DVT (deep venous thrombosis) ([place])   . Fibromyalgia   . Iron deficiency anemia   . Migraines   . PE (pulmonary embolism)     Family History  Problem Relation Age of Onset  . Cancer Mother   . Cancer Father   . Diabetes Sister   . Hypertension Sister     Past Surgical History:  Procedure Laterality Date  . CESAREAN SECTION    . ENDOMETRIAL ABLATION    . HERNIA REPAIR     Social History   Occupational History  . Not on file.   Social History Main Topics  . Smoking status: Current Every Day Smoker  . Smokeless tobacco: Never Used   . Alcohol use No  . Drug use: No  . Sexual activity: Not on file

## 2017-04-11 ENCOUNTER — Other Ambulatory Visit (INDEPENDENT_AMBULATORY_CARE_PROVIDER_SITE_OTHER): Payer: Self-pay

## 2017-04-11 DIAGNOSIS — G8929 Other chronic pain: Secondary | ICD-10-CM

## 2017-04-11 DIAGNOSIS — M25562 Pain in left knee: Principal | ICD-10-CM

## 2017-04-25 ENCOUNTER — Other Ambulatory Visit: Payer: Medicaid Other

## 2017-05-01 ENCOUNTER — Ambulatory Visit (INDEPENDENT_AMBULATORY_CARE_PROVIDER_SITE_OTHER): Payer: Medicaid Other | Admitting: Orthopaedic Surgery

## 2017-07-18 ENCOUNTER — Other Ambulatory Visit (INDEPENDENT_AMBULATORY_CARE_PROVIDER_SITE_OTHER): Payer: Self-pay

## 2017-07-18 MED ORDER — NABUMETONE 500 MG PO TABS: 500.0000 mg | ORAL_TABLET | Freq: Two times a day (BID) | ORAL | 3 refills | Status: DC | PRN

## 2017-10-28 ENCOUNTER — Emergency Department (HOSPITAL_COMMUNITY): Payer: Medicaid Other

## 2017-10-28 ENCOUNTER — Other Ambulatory Visit: Payer: Self-pay

## 2017-10-28 ENCOUNTER — Emergency Department (HOSPITAL_COMMUNITY)
Admission: EM | Admit: 2017-10-28 | Discharge: 2017-10-29 | Disposition: A | Discharge status: Home / Self Care | Payer: Medicaid Other | Attending: Emergency Medicine | Admitting: Emergency Medicine

## 2017-10-28 ENCOUNTER — Encounter (HOSPITAL_COMMUNITY): Payer: Self-pay

## 2017-10-28 DIAGNOSIS — Z86711 Personal history of pulmonary embolism: Secondary | ICD-10-CM | POA: Insufficient documentation

## 2017-10-28 DIAGNOSIS — M25512 Pain in left shoulder: Secondary | ICD-10-CM | POA: Diagnosis not present

## 2017-10-28 DIAGNOSIS — R0789 Other chest pain: Secondary | ICD-10-CM | POA: Diagnosis present

## 2017-10-28 DIAGNOSIS — Z79899 Other long term (current) drug therapy: Secondary | ICD-10-CM | POA: Diagnosis not present

## 2017-10-28 DIAGNOSIS — M79605 Pain in left leg: Secondary | ICD-10-CM | POA: Diagnosis not present

## 2017-10-28 DIAGNOSIS — M545 Low back pain: Secondary | ICD-10-CM | POA: Insufficient documentation

## 2017-10-28 DIAGNOSIS — R0602 Shortness of breath: Secondary | ICD-10-CM | POA: Insufficient documentation

## 2017-10-28 DIAGNOSIS — D649 Anemia, unspecified: Secondary | ICD-10-CM

## 2017-10-28 DIAGNOSIS — M542 Cervicalgia: Secondary | ICD-10-CM | POA: Diagnosis not present

## 2017-10-28 DIAGNOSIS — F17210 Nicotine dependence, cigarettes, uncomplicated: Secondary | ICD-10-CM | POA: Insufficient documentation

## 2017-10-28 DIAGNOSIS — R079 Chest pain, unspecified: Secondary | ICD-10-CM

## 2017-10-28 MED ORDER — SODIUM CHLORIDE 0.9 % IV BOLUS
1000.0000 mL | Freq: Once | INTRAVENOUS | Status: AC
  Administered 2017-10-28: 1000 mL via INTRAVENOUS

## 2017-10-28 MED ORDER — CYCLOBENZAPRINE HCL 10 MG PO TABS
5.0000 mg | ORAL_TABLET | Freq: Once | ORAL | Status: AC
  Administered 2017-10-28: 5 mg via ORAL
  Filled 2017-10-28: qty 1

## 2017-10-28 MED ORDER — FENTANYL CITRATE (PF) 100 MCG/2ML IJ SOLN
50.0000 ug | Freq: Once | INTRAMUSCULAR | Status: AC
  Administered 2017-10-28: 50 ug via INTRAVENOUS
  Filled 2017-10-28: qty 2

## 2017-10-28 NOTE — ED Triage Notes (Signed)
Pt states she has been having left arm pain, radiating to her neck. Pt states it is painful to turn her head to the left.  No injury/trauma. Pt states that she has a hx of migraines. Pt is prescribed medication, but has not been compliant with taking it.

## 2017-10-28 NOTE — ED Provider Notes (Signed)
[facility] DEPT Provider Note  CSN: [id number] Arrival date & time: 10/28/17 1528  Chief Complaint(s) Arm Pain and Headache  HPI Maria [name] is a 52 y.o. female with a history of pulmonary emboli not currently anticoagulated who presents to the emergency department with 2 days of left neck, shoulder, chest, upper/mid/lower back pain which radiates to the left arm and left lower extremity.  Pain is exacerbated with movement, range of motion, and palpation of these regions.  It also started to be exacerbated with deep breathing which was concerning to the patient for possible pulmonary embolism.  She also endorses some mild shortness of breath.  States that her prior pulmonary emboli were unprovoked.  Patient denies any trauma.  No recent fevers or infections.  No cough.  No nausea or vomiting.  No abdominal pain.  No urinary symptoms. No bladder or bowel incontinence.   HPI  Past Medical History Past Medical History:  Diagnosis Date  . Arthritis   . Benign tumor of abdominal wall   . Chronic back pain   . DVT (deep venous thrombosis) ([place])   . Fibromyalgia   . Iron deficiency anemia   . Migraines   . PE (pulmonary embolism)    Patient Active Problem List   Diagnosis Date Noted  . Fibromyalgia muscle pain 01/13/2014  . Chronic back pain 01/13/2014  . Iron deficiency anemia 11/15/2011  . Pulmonary embolism, bilateral ([place]) 06/03/2011   Home Medication(s) Prior to Admission medications   Medication Sig Start Date End Date Taking? Authorizing Provider  DULoxetine (CYMBALTA) 30 MG capsule Take 1 capsule (30 mg total) by mouth daily. Patient not taking: Reported on 10/29/2017 01/13/14   Charlett Blake, MD  nabumetone (RELAFEN) 500 MG tablet Take 1 tablet (500 mg total) by mouth 2 (two) times daily as needed. Patient not taking: Reported on 10/29/2017 07/18/17   Mcarthur Rossetti, MD                                                                      Past Surgical History Past Surgical History:  Procedure Laterality Date  . CESAREAN SECTION    . ENDOMETRIAL ABLATION    . HERNIA REPAIR     Family History Family History  Problem Relation Age of Onset  . Cancer Mother   . Cancer Father   . Diabetes Sister   . Hypertension Sister     Social History Social History   Tobacco Use  . Smoking status: Current Every Day Smoker    Types: Cigarettes  . Smokeless tobacco: Never Used  . Tobacco comment: 1 pack/week  Substance Use Topics  . Alcohol use: No  . Drug use: No   Allergies Gabapentin  Review of Systems Review of Systems All other systems are reviewed and are negative for acute change except as noted in the HPI  Physical Exam Vital Signs  I have reviewed the triage vital signs BP (!) 153/94   Pulse (!) 55   Temp 97.8 F (36.6 C) (Oral)   Resp 19   Ht 5\' 2"  (1.575 m)   Wt 59 kg (130 lb)   SpO2 100%   BMI 23.78 kg/m   Physical Exam  Constitutional: She is oriented to  person, place, and time. She appears well-developed and well-nourished. No distress.  HENT:  Head: Normocephalic and atraumatic.  Nose: Nose normal.  Eyes: Pupils are equal, round, and reactive to light. Conjunctivae and EOM are normal. Right eye exhibits no discharge. Left eye exhibits no discharge. No scleral icterus.  Neck: Normal range of motion. Neck supple. Muscular tenderness present. No spinous process tenderness present.    Cardiovascular: Normal rate and regular rhythm. Exam reveals no gallop and no friction rub.  No murmur heard. Pulmonary/Chest: Effort normal and breath sounds normal. No stridor. No respiratory distress. She has no rales. She exhibits tenderness.    Abdominal: Soft. She exhibits no distension. There is no tenderness.  Musculoskeletal: She exhibits no edema.       Cervical back: She exhibits tenderness and spasm. She exhibits no bony tenderness.        Back:  Neurological: She is alert and oriented to person, place, and time. She has normal strength. She displays normal reflexes. No cranial nerve deficit or sensory deficit.  Reflex Scores:      Bicep reflexes are 2+ on the right side and 2+ on the left side.      Patellar reflexes are 1+ on the right side and 1+ on the left side. Skin: Skin is warm and dry. No rash noted. She is not diaphoretic. No erythema.  Psychiatric: She has a normal mood and affect.  Vitals reviewed.   ED Results and Treatments Labs (all labs ordered are listed, but only abnormal results are displayed) Labs Reviewed  CBC - Abnormal; Notable for the following components:      Result Value   Hemoglobin 11.5 (*)    HCT 35.7 (*)    All other components within normal limits  COMPREHENSIVE METABOLIC PANEL - Abnormal; Notable for the following components:   Calcium 8.8 (*)    Albumin 3.4 (*)    All other components within normal limits  I-STAT TROPONIN, ED                                                                                                                         EKG  EKG Interpretation  Date/Time:  Sunday Oct 29 2017 00:01:36 EDT Ventricular Rate:  58 PR Interval:    QRS Duration: 97 QT Interval:  440 QTC Calculation: 433 R Axis:   35 Text Interpretation:  Sinus rhythm Anterior infarct, old No significant change since last tracing Confirmed by Addison Lank [phone]) on 10/29/2017 12:09:13 AM      Radiology Dg Chest 2 View  Result Date: 10/28/2017 CLINICAL DATA:  Shortness of breath, smoker. History of pulmonary embolism. EXAM: CHEST - 2 VIEW COMPARISON:  Chest radiograph March 02, 2016 FINDINGS: Cardiac silhouette is mildly enlarged. Mediastinal silhouette is unremarkable. No pleural effusions or focal consolidations. Trachea projects midline and there is no pneumothorax. Soft tissue planes and included osseous structures are non-suspicious. IMPRESSION: Mild cardiomegaly.  No acute  pulmonary process. Electronically Signed  By: Elon Alas M.D.   On: 10/28/2017 23:35   Pertinent labs & imaging results that were available during my care of the patient were reviewed by me and considered in my medical decision making (see chart for details).  Medications Ordered in ED Medications  iopamidol (ISOVUE-370) 76 % injection (has no administration in time range)  sodium chloride 0.9 % bolus 1,000 mL (1,000 mLs Intravenous New Bag/Given 10/28/17 2345)  fentaNYL (SUBLIMAZE) injection 50 mcg (50 mcg Intravenous Given 10/28/17 2345)  cyclobenzaprine (FLEXERIL) tablet 5 mg (5 mg Oral Given 10/28/17 2345)  iopamidol (ISOVUE-370) 76 % injection 100 mL (100 mLs Intravenous Contrast Given 10/29/17 0047)                                                                                                                                    Procedures Procedures  (including critical care time)  Medical Decision Making / ED Course I have reviewed the nursing notes for this encounter and the patient's prior records (if available in EHR or on provided paperwork).    Patient here with thePatient here with left-sided neck and back pain pulmonary embolism in her concern, likely MSK in nature however given patient's history of unprovoked we will obtain screening labs and CTA to rule out PE.  Presentation is highly inconsistent for ACS.  Not classic for dissection or esophageal perforation.  EKG without acute ischemic changes or evidence of pericarditis. Trop neg.  CTA ordered and pending  Patient care turned over to Dr Roxanne Mins at [phone]. Patient case and results discussed in detail; please see their note for further ED managment.      . Final Clinical Impression(s) / ED Diagnoses Final diagnoses:  Chest pain      This chart was dictated using voice recognition software.  Despite best efforts to proofread,  errors can occur which can change the documentation meaning.  Fatima Blank, MD 10/29/17 [phone]

## 2017-10-29 ENCOUNTER — Encounter (HOSPITAL_COMMUNITY): Payer: Self-pay | Admitting: Radiology

## 2017-10-29 ENCOUNTER — Emergency Department (HOSPITAL_COMMUNITY): Payer: Medicaid Other

## 2017-10-29 LAB — COMPREHENSIVE METABOLIC PANEL
ALT: 22 U/L (ref 14–54)
AST: 23 U/L (ref 15–41)
Albumin: 3.4 g/dL — ABNORMAL LOW (ref 3.5–5.0)
Alkaline Phosphatase: 76 U/L (ref 38–126)
Anion gap: 9 (ref 5–15)
BUN: 14 mg/dL (ref 6–20)
CO2: 26 mmol/L (ref 22–32)
Calcium: 8.8 mg/dL — ABNORMAL LOW (ref 8.9–10.3)
Chloride: 106 mmol/L (ref 101–111)
Creatinine, Ser: 0.71 mg/dL (ref 0.44–1.00)
GFR calc Af Amer: >60 mL/min (ref >60)
GFR calc non Af Amer: >60 mL/min (ref >60)
Glucose, Bld: 96 mg/dL (ref 65–99)
Potassium: 3.5 mmol/L (ref 3.5–5.1)
Sodium: 141 mmol/L (ref 135–145)
Total Bilirubin: 0.6 mg/dL (ref 0.3–1.2)
Total Protein: 6.9 g/dL (ref 6.5–8.1)

## 2017-10-29 LAB — I-STAT TROPONIN, ED: Troponin i, poc: 0 ng/mL (ref 0.00–0.08)

## 2017-10-29 LAB — CBC
HCT: 35.7 % — ABNORMAL LOW (ref 36.0–46.0)
Hemoglobin: 11.5 g/dL — ABNORMAL LOW (ref 12.0–15.0)
MCH: 28.5 pg (ref 26.0–34.0)
MCHC: 32.2 g/dL (ref 30.0–36.0)
MCV: 88.6 fL (ref 78.0–100.0)
Platelets: 286 10*3/uL (ref 150–400)
RBC: 4.03 MIL/uL (ref 3.87–5.11)
RDW: 14 % (ref 11.5–15.5)
WBC: 6.9 10*3/uL (ref 4.0–10.5)

## 2017-10-29 MED ORDER — IOPAMIDOL (ISOVUE-370) INJECTION 76%
INTRAVENOUS | Status: AC
  Filled 2017-10-29: qty 100

## 2017-10-29 MED ORDER — NAPROXEN 500 MG PO TABS: 500.0000 mg | ORAL_TABLET | Freq: Two times a day (BID) | ORAL | 0 refills | Status: DC

## 2017-10-29 MED ORDER — KETOROLAC TROMETHAMINE 30 MG/ML IJ SOLN
30.0000 mg | Freq: Once | INTRAMUSCULAR | Status: AC
  Administered 2017-10-29: 30 mg via INTRAVENOUS
  Filled 2017-10-29: qty 1

## 2017-10-29 MED ORDER — IOPAMIDOL (ISOVUE-370) INJECTION 76%
100.0000 mL | Freq: Once | INTRAVENOUS | Status: AC | PRN
  Administered 2017-10-29: 100 mL via INTRAVENOUS

## 2017-10-29 MED ORDER — CYCLOBENZAPRINE HCL 10 MG PO TABS: 10.0000 mg | ORAL_TABLET | Freq: Three times a day (TID) | ORAL | 0 refills | Status: DC | PRN

## 2017-10-29 NOTE — ED Provider Notes (Signed)
Care assumed from Dr. Leonette Monarch, patient being sent for CT scan of the chest to rule out pulmonary embolism.  Prior history of unprovoked DVT and pulmonary emboli, not on anticoagulants.  CT scan shows no evidence of pulmonary embolism.  Laboratory work-up was significant for mild anemia which is not changed from baseline.  She had received a dose of cyclobenzaprine and fentanyl, and pain was significantly improved.  She is given a dose of ketorolac.  She is discharged with prescriptions for cyclobenzaprine and naproxen.  She is referred back to her PCP for follow-up, and for discussion about whether she should be on lifelong anticoagulation.  Results for orders placed or performed during the hospital encounter of 10/28/17  CBC  Result Value Ref Range   WBC 6.9 4.0 - 10.5 K/uL   RBC 4.03 3.87 - 5.11 MIL/uL   Hemoglobin 11.5 (L) 12.0 - 15.0 g/dL   HCT 35.7 (L) 36.0 - 46.0 %   MCV 88.6 78.0 - 100.0 fL   MCH 28.5 26.0 - 34.0 pg   MCHC 32.2 30.0 - 36.0 g/dL   RDW 14.0 11.5 - 15.5 %   Platelets 286 150 - 400 K/uL  Comprehensive metabolic panel  Result Value Ref Range   Sodium 141 135 - 145 mmol/L   Potassium 3.5 3.5 - 5.1 mmol/L   Chloride 106 101 - 111 mmol/L   CO2 26 22 - 32 mmol/L   Glucose, Bld 96 65 - 99 mg/dL   BUN 14 6 - 20 mg/dL   Creatinine, Ser 0.71 0.44 - 1.00 mg/dL   Calcium 8.8 (L) 8.9 - 10.3 mg/dL   Total Protein 6.9 6.5 - 8.1 g/dL   Albumin 3.4 (L) 3.5 - 5.0 g/dL   AST 23 15 - 41 U/L   ALT 22 14 - 54 U/L   Alkaline Phosphatase 76 38 - 126 U/L   Total Bilirubin 0.6 0.3 - 1.2 mg/dL   GFR calc non Af Amer >60 >60 mL/min   GFR calc Af Amer >60 >60 mL/min   Anion gap 9 5 - 15  I-stat troponin, ED  Result Value Ref Range   Troponin i, poc 0.00 0.00 - 0.08 ng/mL   Comment 3           Dg Chest 2 View  Result Date: 10/28/2017 CLINICAL DATA:  Shortness of breath, smoker. History of pulmonary embolism. EXAM: CHEST - 2 VIEW COMPARISON:  Chest radiograph March 02, 2016  FINDINGS: Cardiac silhouette is mildly enlarged. Mediastinal silhouette is unremarkable. No pleural effusions or focal consolidations. Trachea projects midline and there is no pneumothorax. Soft tissue planes and included osseous structures are non-suspicious. IMPRESSION: Mild cardiomegaly.  No acute pulmonary process. Electronically Signed   By: Elon Alas M.D.   On: 10/28/2017 23:35   Ct Angio Chest Pe W And/or Wo Contrast  Result Date: 10/29/2017 CLINICAL DATA:  52 year old female with shortness of breath. Concern for pulmonary embolism. EXAM: CT ANGIOGRAPHY CHEST WITH CONTRAST TECHNIQUE: Multidetector CT imaging of the chest was performed using the standard protocol during bolus administration of intravenous contrast. Multiplanar CT image reconstructions and MIPs were obtained to evaluate the vascular anatomy. CONTRAST:  159mL ISOVUE-370 IOPAMIDOL (ISOVUE-370) INJECTION 76% COMPARISON:  Chest radiograph dated 10/28/2017 FINDINGS: Cardiovascular: Borderline cardiomegaly. No pericardial effusion. The thoracic aorta is unremarkable. No CT evidence of pulmonary embolism. Mediastinum/Nodes: There is no hilar or mediastinal adenopathy. Esophagus is grossly unremarkable. No mediastinal fluid collection. Lungs/Pleura: Minimal bibasilar dependent atelectatic changes of the lungs. There  is no focal consolidation, pleural effusion, or pneumothorax. Mild thickening of the bronchial wall may represent bronchitis. Clinical correlation is recommended. The central airways remain patent. Upper Abdomen: A 15 mm hypodense lesion in the upper pole of the left kidney is not well characterized but may represent a cyst. The visualized upper abdomen is otherwise unremarkable. Musculoskeletal: No chest wall abnormality. No acute or significant osseous findings. Review of the MIP images confirms the above findings. IMPRESSION: 1. No CT evidence of pulmonary embolism. 2. Mild thickened appearance of the bronchi may represent  bronchitis. Clinical correlation is recommended. Electronically Signed   By: Anner Crete M.D.   On: 10/29/2017 01:34   Images viewed by me.    Delora Fuel, MD 27/51/70 [phone]

## 2017-10-29 NOTE — Discharge Instructions (Signed)
Your CT scan did not show any evidence of blood clots in the lung.  Apply ice as needed. Take acetaminophen as needed for additional pain relief.  Talk with your primary care provider about whether you should be taking blood thinners with your history of blood clots.

## 2017-11-12 ENCOUNTER — Emergency Department (HOSPITAL_COMMUNITY)
Admission: EM | Admit: 2017-11-12 | Discharge: 2017-11-12 | Disposition: A | Discharge status: Home / Self Care | Payer: Medicaid Other | Attending: Emergency Medicine | Admitting: Emergency Medicine

## 2017-11-12 ENCOUNTER — Emergency Department (HOSPITAL_COMMUNITY): Payer: Medicaid Other

## 2017-11-12 ENCOUNTER — Encounter (HOSPITAL_COMMUNITY): Payer: Self-pay | Admitting: Emergency Medicine

## 2017-11-12 DIAGNOSIS — M79605 Pain in left leg: Secondary | ICD-10-CM | POA: Diagnosis present

## 2017-11-12 DIAGNOSIS — M25532 Pain in left wrist: Secondary | ICD-10-CM | POA: Insufficient documentation

## 2017-11-12 DIAGNOSIS — M542 Cervicalgia: Secondary | ICD-10-CM | POA: Diagnosis not present

## 2017-11-12 DIAGNOSIS — F17210 Nicotine dependence, cigarettes, uncomplicated: Secondary | ICD-10-CM | POA: Insufficient documentation

## 2017-11-12 MED ORDER — IBUPROFEN 400 MG PO TABS
600.0000 mg | ORAL_TABLET | Freq: Once | ORAL | Status: AC
  Administered 2017-11-12: 600 mg via ORAL
  Filled 2017-11-12: qty 1

## 2017-11-12 MED ORDER — IBUPROFEN 600 MG PO TABS: 600.0000 mg | ORAL_TABLET | Freq: Four times a day (QID) | ORAL | 0 refills | Status: DC | PRN

## 2017-11-12 MED ORDER — METHOCARBAMOL 500 MG PO TABS
1000.0000 mg | ORAL_TABLET | Freq: Once | ORAL | Status: AC
  Administered 2017-11-12: 1000 mg via ORAL
  Filled 2017-11-12: qty 2
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
## 2017-11-14 ENCOUNTER — Ambulatory Visit (HOSPITAL_COMMUNITY)
Admission: EM | Admit: 2017-11-14 | Discharge: 2017-11-14 | Disposition: A | Discharge status: Home / Self Care | Payer: Medicaid Other | Attending: Family Medicine | Admitting: Family Medicine

## 2017-11-14 ENCOUNTER — Encounter (HOSPITAL_COMMUNITY): Payer: Self-pay | Admitting: Emergency Medicine

## 2017-11-14 DIAGNOSIS — M542 Cervicalgia: Secondary | ICD-10-CM

## 2017-11-14 DIAGNOSIS — G43909 Migraine, unspecified, not intractable, without status migrainosus: Secondary | ICD-10-CM

## 2017-11-14 DIAGNOSIS — M79605 Pain in left leg: Secondary | ICD-10-CM | POA: Diagnosis not present

## 2017-11-14 DIAGNOSIS — G43009 Migraine without aura, not intractable, without status migrainosus: Secondary | ICD-10-CM

## 2017-11-14 DIAGNOSIS — G8911 Acute pain due to trauma: Secondary | ICD-10-CM

## 2017-11-14 DIAGNOSIS — M25512 Pain in left shoulder: Secondary | ICD-10-CM

## 2017-11-14 MED ORDER — KETOROLAC TROMETHAMINE 60 MG/2ML IM SOLN
60.0000 mg | Freq: Once | INTRAMUSCULAR | Status: AC
  Administered 2017-11-14: 60 mg via INTRAMUSCULAR

## 2017-11-14 MED ORDER — HYDROCODONE-ACETAMINOPHEN 5-325 MG PO TABS: 1.0000 | ORAL_TABLET | Freq: Four times a day (QID) | ORAL | 0 refills | Status: DC | PRN

## 2017-11-14 MED ORDER — DEXAMETHASONE SODIUM PHOSPHATE 10 MG/ML IJ SOLN
INTRAMUSCULAR | Status: AC
  Filled 2017-11-14: qty 1

## 2017-11-14 MED ORDER — DIAZEPAM 5 MG PO TABS: 5.0000 mg | ORAL_TABLET | Freq: Two times a day (BID) | ORAL | 0 refills | Status: DC

## 2017-11-14 MED ORDER — DEXAMETHASONE SODIUM PHOSPHATE 10 MG/ML IJ SOLN
10.0000 mg | Freq: Once | INTRAMUSCULAR | Status: AC
  Administered 2017-11-14: 10 mg via INTRAMUSCULAR

## 2017-11-14 MED ORDER — KETOROLAC TROMETHAMINE 60 MG/2ML IM SOLN
INTRAMUSCULAR | Status: AC
  Filled 2017-11-14: qty 2
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
## 2017-11-14 NOTE — Discharge Instructions (Signed)
Ice to painful areas Continue Naprosyn twice a day for moderate pain Take Naprosyn with food Use hydrocodone as needed for severe pain, cautioned drowsiness Use diazepam 1 to 2 pills at bedtime for the next couple of nights to get sleep This is a strong muscle relaxer After this you can go back on Flexeril You need to go to an orthopedic for follow-up Call your doctor tomorrow to get an appointment next week Do the gentle range of motion, pendulum, exercises for your shoulder twice a day

## 2018-11-13 ENCOUNTER — Telehealth (HOSPITAL_COMMUNITY): Payer: Self-pay | Admitting: Rehabilitation

## 2018-11-13 ENCOUNTER — Other Ambulatory Visit (HOSPITAL_COMMUNITY): Payer: Self-pay | Admitting: Internal Medicine

## 2018-11-13 DIAGNOSIS — M25571 Pain in right ankle and joints of right foot: Secondary | ICD-10-CM

## 2018-11-13 NOTE — Telephone Encounter (Signed)
The above patient or their representative was contacted and gave the following answers to these questions:         Do you have any of the following symptoms? No  Fever                    Cough                   Shortness of breath  Do  you have any of the following other symptoms? No   muscle pain         vomiting,        diarrhea        rash         weakness        red eye        abdominal pain         bruising          bruising or bleeding              joint pain           severe headache    Have you been in contact with someone who was or has been sick in the past 2 weeks? No  Yes                 Unsure                         Unable to assess   Does the person that you were in contact with have any of the following symptoms?   Cough         shortness of breath           muscle pain         vomiting,            diarrhea            rash            weakness           fever            red eye           abdominal pain           bruising  or  bleeding                joint pain                severe headache               Have you  or someone you have been in contact with traveled internationally in th last month? No        If yes, which countries?   Have you  or someone you have been in contact with traveled outside [place] in th last month? No         If yes, which state and city?   COMMENTS OR ACTION PLAN FOR THIS PATIENT:          

## 2018-11-14 ENCOUNTER — Other Ambulatory Visit: Payer: Self-pay

## 2018-11-14 ENCOUNTER — Ambulatory Visit (HOSPITAL_COMMUNITY)
Admission: RE | Admit: 2018-11-14 | Discharge: 2018-11-14 | Disposition: A | Discharge status: Home / Self Care | Payer: Medicaid Other | Source: Ambulatory Visit | Attending: Family | Admitting: Family

## 2018-11-14 DIAGNOSIS — M25571 Pain in right ankle and joints of right foot: Secondary | ICD-10-CM | POA: Diagnosis not present

## 2018-11-26 ENCOUNTER — Ambulatory Visit: Payer: Self-pay | Admitting: Physician Assistant

## 2018-11-26 NOTE — H&P (Signed)
Maria [name] is an 53 y.o. female.   Chief Complaint: left knee pain HPI: She continues to complain of knee pain.  She states she went to physical therapy, but she continues to have significant joint line tenderness medial and lateral. We saw her back on 09-24-18 and she has had several months of pain. She has not responded to conservative treatment. She has tried two different anti-inflammatories.   Past Medical History:  Diagnosis Date  . Arthritis   . Benign tumor of abdominal wall   . Chronic back pain   . DVT (deep venous thrombosis) ([place])   . Fibromyalgia   . Iron deficiency anemia   . Migraines   . PE (pulmonary embolism)     Past Surgical History:  Procedure Laterality Date  . CESAREAN SECTION    . ENDOMETRIAL ABLATION    . HERNIA REPAIR      Family History  Problem Relation Age of Onset  . Cancer Mother   . Cancer Father   . Diabetes Sister   . Hypertension Sister    Social History:  reports that she has been smoking cigarettes. She has never used smokeless tobacco. She reports that she does not drink alcohol or use drugs.  Allergies:  Allergies  Allergen Reactions  . Gabapentin Anaphylaxis and Swelling     hx states "swelling"    (Not in a hospital admission)   No results found for this or any previous visit (from the past 48 hour(s)). No results found.  Review of Systems  Musculoskeletal: Positive for joint pain.  Neurological: Positive for headaches.  Endo/Heme/Allergies: Bruises/bleeds easily.  Psychiatric/Behavioral: Positive for depression. The patient is nervous/anxious.   All other systems reviewed and are negative.   There were no vitals taken for this visit. Physical Exam  Constitutional: She is oriented to person, place, and time. She appears well-developed and well-nourished. No distress.  HENT:  Head: Normocephalic and atraumatic.  Eyes: Pupils are equal, round, and reactive to light. Conjunctivae and EOM are normal.  Neck: Normal range  of motion. Neck supple.  Cardiovascular: Normal rate and intact distal pulses.  Respiratory: Effort normal. No respiratory distress.  GI: Soft. She exhibits no distension.  Neurological: She is alert and oriented to person, place, and time.  Skin: Skin is warm and dry. No rash noted. No erythema.  Psychiatric: She has a normal mood and affect. Her behavior is normal.     Assessment/Plan Left knee shows meniscus tear with degenerative change.  She is due to get ultrasound on the opposite leg.  She is in enough pain to justify consideration of surgery.  I think it would be a bit aggressive based on her age of early 64's to do a total.  However, there is certainly an issue of the potential to help her with a knee scope, lateral meniscectomy, chondroplasty.  She has transportation issues.  We cannot really schedule her until she can resolve that.  She does have someone who can be with her at night, but she does not drive.  Risks and benefits discussed in detail.  Proceed on with possible scheduling once these issues are resolved.   Chriss Czar, PA-C 11/26/2018, 1:38 PM

## 2018-11-26 NOTE — H&P (View-Only) (Signed)
Aysha Livecchi is an 53 y.o. female.   Chief Complaint: left knee pain HPI: She continues to complain of knee pain.  She states she went to physical therapy, but she continues to have significant joint line tenderness medial and lateral. We saw her back on 09-24-18 and she has had several months of pain. She has not responded to conservative treatment. She has tried two different anti-inflammatories.   Past Medical History:  Diagnosis Date  . Arthritis   . Benign tumor of abdominal wall   . Chronic back pain   . DVT (deep venous thrombosis) ([place])   . Fibromyalgia   . Iron deficiency anemia   . Migraines   . PE (pulmonary embolism)     Past Surgical History:  Procedure Laterality Date  . CESAREAN SECTION    . ENDOMETRIAL ABLATION    . HERNIA REPAIR      Family History  Problem Relation Age of Onset  . Cancer Mother   . Cancer Father   . Diabetes Sister   . Hypertension Sister    Social History:  reports that she has been smoking cigarettes. She has never used smokeless tobacco. She reports that she does not drink alcohol or use drugs.  Allergies:  Allergies  Allergen Reactions  . Gabapentin Anaphylaxis and Swelling     hx states "swelling"    (Not in a hospital admission)   No results found for this or any previous visit (from the past 48 hour(s)). No results found.  Review of Systems  Musculoskeletal: Positive for joint pain.  Neurological: Positive for headaches.  Endo/Heme/Allergies: Bruises/bleeds easily.  Psychiatric/Behavioral: Positive for depression. The patient is nervous/anxious.   All other systems reviewed and are negative.   There were no vitals taken for this visit. Physical Exam  Constitutional: She is oriented to person, place, and time. She appears well-developed and well-nourished. No distress.  HENT:  Head: Normocephalic and atraumatic.  Eyes: Pupils are equal, round, and reactive to light. Conjunctivae and EOM are normal.  Neck: Normal range  of motion. Neck supple.  Cardiovascular: Normal rate and intact distal pulses.  Respiratory: Effort normal. No respiratory distress.  GI: Soft. She exhibits no distension.  Neurological: She is alert and oriented to person, place, and time.  Skin: Skin is warm and dry. No rash noted. No erythema.  Psychiatric: She has a normal mood and affect. Her behavior is normal.     Assessment/Plan Left knee shows meniscus tear with degenerative change.  She is due to get ultrasound on the opposite leg.  She is in enough pain to justify consideration of surgery.  I think it would be a bit aggressive based on her age of early 53's to do a total.  However, there is certainly an issue of the potential to help her with a knee scope, lateral meniscectomy, chondroplasty.  She has transportation issues.  We cannot really schedule her until she can resolve that.  She does have someone who can be with her at night, but she does not drive.  Risks and benefits discussed in detail.  Proceed on with possible scheduling once these issues are resolved.   Chriss Czar, PA-C 11/26/2018, 1:38 PM

## 2018-12-03 ENCOUNTER — Other Ambulatory Visit: Payer: Self-pay

## 2018-12-03 ENCOUNTER — Encounter (HOSPITAL_BASED_OUTPATIENT_CLINIC_OR_DEPARTMENT_OTHER): Payer: Self-pay | Admitting: *Deleted

## 2018-12-04 ENCOUNTER — Other Ambulatory Visit (HOSPITAL_COMMUNITY)
Admission: RE | Admit: 2018-12-04 | Discharge: 2018-12-04 | Disposition: A | Discharge status: Home / Self Care | Payer: Medicaid Other | Source: Ambulatory Visit | Attending: Orthopedic Surgery | Admitting: Orthopedic Surgery

## 2018-12-04 DIAGNOSIS — Z1159 Encounter for screening for other viral diseases: Secondary | ICD-10-CM | POA: Insufficient documentation

## 2018-12-05 LAB — NOVEL CORONAVIRUS, NAA (HOSP ORDER, SEND-OUT TO REF LAB; TAT 18-24 HRS): SARS-CoV-2, NAA: NOT DETECTED

## 2018-12-07 ENCOUNTER — Encounter (HOSPITAL_BASED_OUTPATIENT_CLINIC_OR_DEPARTMENT_OTHER): Payer: Self-pay | Admitting: Emergency Medicine

## 2018-12-07 ENCOUNTER — Other Ambulatory Visit: Payer: Self-pay

## 2018-12-07 ENCOUNTER — Ambulatory Visit (HOSPITAL_BASED_OUTPATIENT_CLINIC_OR_DEPARTMENT_OTHER): Payer: Medicaid Other | Admitting: Anesthesiology

## 2018-12-07 ENCOUNTER — Ambulatory Visit (HOSPITAL_BASED_OUTPATIENT_CLINIC_OR_DEPARTMENT_OTHER)
Admission: RE | Admit: 2018-12-07 | Discharge: 2018-12-07 | Disposition: A | Discharge status: Home / Self Care | Payer: Medicaid Other | Attending: Orthopedic Surgery | Admitting: Orthopedic Surgery

## 2018-12-07 ENCOUNTER — Encounter (HOSPITAL_BASED_OUTPATIENT_CLINIC_OR_DEPARTMENT_OTHER)
Admission: RE | Disposition: A | Discharge status: Home / Self Care | Payer: Self-pay | Source: Home / Self Care | Attending: Orthopedic Surgery

## 2018-12-07 DIAGNOSIS — M1990 Unspecified osteoarthritis, unspecified site: Secondary | ICD-10-CM | POA: Diagnosis not present

## 2018-12-07 DIAGNOSIS — S83282A Other tear of lateral meniscus, current injury, left knee, initial encounter: Secondary | ICD-10-CM | POA: Insufficient documentation

## 2018-12-07 DIAGNOSIS — Z86718 Personal history of other venous thrombosis and embolism: Secondary | ICD-10-CM | POA: Diagnosis not present

## 2018-12-07 DIAGNOSIS — M2242 Chondromalacia patellae, left knee: Secondary | ICD-10-CM | POA: Diagnosis not present

## 2018-12-07 DIAGNOSIS — M797 Fibromyalgia: Secondary | ICD-10-CM | POA: Diagnosis not present

## 2018-12-07 DIAGNOSIS — Z86711 Personal history of pulmonary embolism: Secondary | ICD-10-CM | POA: Diagnosis not present

## 2018-12-07 DIAGNOSIS — F17210 Nicotine dependence, cigarettes, uncomplicated: Secondary | ICD-10-CM | POA: Diagnosis not present

## 2018-12-07 DIAGNOSIS — X58XXXA Exposure to other specified factors, initial encounter: Secondary | ICD-10-CM | POA: Insufficient documentation

## 2018-12-07 HISTORY — PX: KNEE ARTHROSCOPY WITH DRILLING/MICROFRACTURE: SHX6425

## 2018-12-07 HISTORY — PX: KNEE ARTHROSCOPY WITH MEDIAL MENISECTOMY: SHX5651

## 2018-12-07 SURGERY — ARTHROSCOPY, KNEE, WITH MEDIAL MENISCECTOMY
Anesthesia: General | Site: Knee | Laterality: Left

## 2018-12-07 MED ORDER — MIDAZOLAM HCL 2 MG/2ML IJ SOLN
INTRAMUSCULAR | Status: AC
  Filled 2018-12-07: qty 2

## 2018-12-07 MED ORDER — KETOROLAC TROMETHAMINE 30 MG/ML IJ SOLN
INTRAMUSCULAR | Status: DC | PRN
  Administered 2018-12-07: 30 mg via INTRAVENOUS

## 2018-12-07 MED ORDER — MEPERIDINE HCL 25 MG/ML IJ SOLN: 6.2500 mg | INTRAMUSCULAR | Status: DC | PRN

## 2018-12-07 MED ORDER — FENTANYL CITRATE (PF) 100 MCG/2ML IJ SOLN
25.0000 ug | INTRAMUSCULAR | Status: DC | PRN
  Administered 2018-12-07 (×2): 50 ug via INTRAVENOUS

## 2018-12-07 MED ORDER — CEFAZOLIN SODIUM-DEXTROSE 2-3 GM-%(50ML) IV SOLR
INTRAVENOUS | Status: DC | PRN
  Administered 2018-12-07: 2 g via INTRAVENOUS

## 2018-12-07 MED ORDER — PROMETHAZINE HCL 12.5 MG PO TABS: 12.5000 mg | ORAL_TABLET | Freq: Four times a day (QID) | ORAL | 0 refills | Status: DC | PRN

## 2018-12-07 MED ORDER — ONDANSETRON HCL 4 MG/2ML IJ SOLN
INTRAMUSCULAR | Status: DC | PRN
  Administered 2018-12-07: 4 mg via INTRAVENOUS

## 2018-12-07 MED ORDER — OXYCODONE HCL 5 MG PO TABS
ORAL_TABLET | ORAL | Status: AC
  Filled 2018-12-07: qty 1

## 2018-12-07 MED ORDER — CEFAZOLIN SODIUM-DEXTROSE 2-4 GM/100ML-% IV SOLN
INTRAVENOUS | Status: AC
  Filled 2018-12-07: qty 100

## 2018-12-07 MED ORDER — FENTANYL CITRATE (PF) 100 MCG/2ML IJ SOLN
50.0000 ug | INTRAMUSCULAR | Status: DC | PRN
  Administered 2018-12-07: 25 ug via INTRAVENOUS
  Administered 2018-12-07: 100 ug via INTRAVENOUS

## 2018-12-07 MED ORDER — ACETAMINOPHEN 325 MG PO TABS: 325.0000 mg | ORAL_TABLET | ORAL | Status: DC | PRN

## 2018-12-07 MED ORDER — PROPOFOL 10 MG/ML IV BOLUS
INTRAVENOUS | Status: AC
  Filled 2018-12-07: qty 20

## 2018-12-07 MED ORDER — ACETAMINOPHEN 160 MG/5ML PO SOLN: 325.0000 mg | ORAL | Status: DC | PRN

## 2018-12-07 MED ORDER — FENTANYL CITRATE (PF) 100 MCG/2ML IJ SOLN
INTRAMUSCULAR | Status: AC
  Filled 2018-12-07: qty 2

## 2018-12-07 MED ORDER — MIDAZOLAM HCL 2 MG/2ML IJ SOLN
1.0000 mg | INTRAMUSCULAR | Status: DC | PRN
  Administered 2018-12-07: 2 mg via INTRAVENOUS

## 2018-12-07 MED ORDER — PROPOFOL 10 MG/ML IV BOLUS
INTRAVENOUS | Status: DC | PRN
  Administered 2018-12-07: 200 mg via INTRAVENOUS

## 2018-12-07 MED ORDER — SCOPOLAMINE 1 MG/3DAYS TD PT72: 1.0000 | MEDICATED_PATCH | Freq: Once | TRANSDERMAL | Status: DC

## 2018-12-07 MED ORDER — OXYCODONE HCL 5 MG/5ML PO SOLN: 5.0000 mg | Freq: Once | ORAL | Status: AC | PRN

## 2018-12-07 MED ORDER — CHLORHEXIDINE GLUCONATE 4 % EX LIQD: 60.0000 mL | Freq: Once | CUTANEOUS | Status: DC

## 2018-12-07 MED ORDER — LACTATED RINGERS IV SOLN
INTRAVENOUS | Status: DC
  Administered 2018-12-07 (×2): via INTRAVENOUS

## 2018-12-07 MED ORDER — OXYCODONE HCL 5 MG PO TABS
5.0000 mg | ORAL_TABLET | Freq: Once | ORAL | Status: AC | PRN
  Administered 2018-12-07: 5 mg via ORAL

## 2018-12-07 MED ORDER — HYDROMORPHONE HCL 1 MG/ML IJ SOLN
INTRAMUSCULAR | Status: DC | PRN
  Administered 2018-12-07: 0.5 mg via INTRAVENOUS

## 2018-12-07 MED ORDER — DEXAMETHASONE SODIUM PHOSPHATE 10 MG/ML IJ SOLN
INTRAMUSCULAR | Status: DC | PRN
  Administered 2018-12-07: 10 mg via INTRAVENOUS

## 2018-12-07 MED ORDER — HYDROMORPHONE HCL 1 MG/ML IJ SOLN
INTRAMUSCULAR | Status: AC
  Filled 2018-12-07: qty 0.5

## 2018-12-07 MED ORDER — SODIUM CHLORIDE 0.9 % IV SOLN: INTRAVENOUS | Status: DC

## 2018-12-07 MED ORDER — CEFAZOLIN SODIUM-DEXTROSE 2-4 GM/100ML-% IV SOLN: 2.0000 g | INTRAVENOUS | Status: DC

## 2018-12-07 MED ORDER — ONDANSETRON HCL 4 MG/2ML IJ SOLN: 4.0000 mg | Freq: Once | INTRAMUSCULAR | Status: DC | PRN

## 2018-12-07 SURGICAL SUPPLY — 36 items
BANDAGE ACE 6X5 VEL STRL LF (GAUZE/BANDAGES/DRESSINGS) IMPLANT
BANDAGE ESMARK 6X9 LF (GAUZE/BANDAGES/DRESSINGS) IMPLANT
BNDG ESMARK 6X9 LF (GAUZE/BANDAGES/DRESSINGS)
BNDG GAUZE ELAST 4 BULKY (GAUZE/BANDAGES/DRESSINGS) ×2 IMPLANT
COVER WAND RF STERILE (DRAPES) IMPLANT
DISSECTOR 3.5MM X 13CM CVD (MISCELLANEOUS) IMPLANT
DRAPE ARTHROSCOPY W/POUCH 90 (DRAPES) ×2 IMPLANT
DRSG EMULSION OIL 3X3 NADH (GAUZE/BANDAGES/DRESSINGS) ×2 IMPLANT
DURAPREP 26ML APPLICATOR (WOUND CARE) ×2 IMPLANT
EXCALIBUR 3.8MM X 13CM (MISCELLANEOUS) ×2 IMPLANT
GAUZE SPONGE 4X4 12PLY STRL (GAUZE/BANDAGES/DRESSINGS) ×2 IMPLANT
GLOVE BIO SURGEON STRL SZ 6.5 (GLOVE) ×2 IMPLANT
GLOVE BIO SURGEON STRL SZ7.5 (GLOVE) IMPLANT
GLOVE BIOGEL PI IND STRL 7.0 (GLOVE) ×2 IMPLANT
GLOVE BIOGEL PI IND STRL 8 (GLOVE) ×1 IMPLANT
GLOVE BIOGEL PI INDICATOR 7.0 (GLOVE) ×2
GLOVE BIOGEL PI INDICATOR 8 (GLOVE) ×1
GLOVE SURG ORTHO 8.0 STRL STRW (GLOVE) ×2 IMPLANT
GOWN STRL REUS W/ TWL LRG LVL3 (GOWN DISPOSABLE) ×1 IMPLANT
GOWN STRL REUS W/ TWL XL LVL3 (GOWN DISPOSABLE) ×1 IMPLANT
GOWN STRL REUS W/TWL LRG LVL3 (GOWN DISPOSABLE) ×1
GOWN STRL REUS W/TWL XL LVL3 (GOWN DISPOSABLE) ×3 IMPLANT
HOLDER KNEE FOAM BLUE (MISCELLANEOUS) ×2 IMPLANT
KNEE WRAP E Z 3 GEL PACK (MISCELLANEOUS) ×2 IMPLANT
MANIFOLD NEPTUNE II (INSTRUMENTS) ×2 IMPLANT
NDL SAFETY ECLIPSE 18X1.5 (NEEDLE) ×1 IMPLANT
NEEDLE HYPO 18GX1.5 SHARP (NEEDLE) ×1
PACK ARTHROSCOPY DSU (CUSTOM PROCEDURE TRAY) ×2 IMPLANT
PACK BASIN DAY SURGERY FS (CUSTOM PROCEDURE TRAY) ×2 IMPLANT
PORT APPOLLO RF 90DEGREE MULTI (SURGICAL WAND) IMPLANT
PROBE BIPOLAR ARTHRO 85MM 30D (MISCELLANEOUS) IMPLANT
SUT ETHILON 4 0 PS 2 18 (SUTURE) ×2 IMPLANT
SYR 5ML LL (SYRINGE) ×2 IMPLANT
TOWEL GREEN STERILE FF (TOWEL DISPOSABLE) ×2 IMPLANT
TUBING ARTHROSCOPY IRRIG 16FT (MISCELLANEOUS) ×2 IMPLANT
WATER STERILE IRR 1000ML POUR (IV SOLUTION) ×2 IMPLANT

## 2018-12-07 NOTE — Anesthesia Postprocedure Evaluation (Signed)
Anesthesia Post Note  Patient: Maria [name]  Procedure(s) Performed: KNEE ARTHROSCOPY LATERAL MENISECTOMY (Left Knee) KNEE ARTHROSCOPY WITH MICROFRACTURE with chondroplasty (Left Knee)     Patient location during evaluation: PACU Anesthesia Type: General Level of consciousness: awake and alert Pain management: pain level controlled Vital Signs Assessment: post-procedure vital signs reviewed and stable Respiratory status: spontaneous breathing, nonlabored ventilation, respiratory function stable and patient connected to nasal cannula oxygen Cardiovascular status: blood pressure returned to baseline and stable Postop Assessment: no apparent nausea or vomiting Anesthetic complications: no    Last Vitals:  Vitals:   12/07/18 1030 12/07/18 1102  BP: (!) 150/96 (!) 144/107  Pulse: (!) 49 (!) 48  Resp: 11 16  Temp:  37.1 C  SpO2: 95% 95%    Last Pain:  Vitals:   12/07/18 1102  TempSrc:   PainSc: 4     LLE Motor Response: Purposeful movement (12/07/18 1102) LLE Sensation: Numbness (12/07/18 1102)          Maria [name]

## 2018-12-07 NOTE — Op Note (Signed)
NAMEYAMINA, LENIS MEDICAL RECORD [id number] ACCOUNT [id number] DATE OF BIRTH:[birth year] FACILITY: [facility] LOCATION: [facility] PHYSICIAN:W. Cindia Hustead JR., MD  OPERATIVE REPORT  DATE OF PROCEDURE:  12/07/2018  PREOPERATIVE  DIAGNOSIS:   1.  Grade III chondromalacia of lateral compartment. 2.  Grade III-IV chondromalacia lateral compartment.   3.  Complex tear lateral meniscus.   4.  Chondromalacia of patellofemoral joint.  OPERATION: 1.  Partial lateral meniscectomy. 2.  Debridement chondroplasty of patellofemoral joint and lateral compartment. 3.  Microfracture lateral tibial plateau.  SURGEON:  Vangie Bicker, MD  ANESTHESIA:  General with local supplementation.  DESCRIPTION OF PROCEDURE:  Leg holder.  General anesthetic inferior medial inferolateral portals created.  Systematic inspection of the knee showed the patient had mild chondromalacia patella, debrided.  Medial compartment was essentially normal.  We did  debride patellofemoral area.  The lateral compartment showed a complex tear of the meniscus.  Along its whole posterior thirds, we resected about 20% of the meniscus.  The patient had unusual wear pattern with grade III changes on the femur, which were  debrided with no grade IV noted cobblestone appearance to the lateral tibial plateau with the posterior one half showing grade III with several areas of grade IV chondromalacia that we did elect to microfracture.  Again, debridement chondroplasty was  carried out.  Patellofemoral joint, lateral compartment, partial lateral meniscectomy.  Knee drained free of fluid.  Portals were closed with nylon, infiltrated with Marcaine and 80 mg Depo-Medrol.  AN/NUANCE  D:12/07/2018 T:12/07/2018 JOB:[id number]

## 2018-12-07 NOTE — Anesthesia Preprocedure Evaluation (Addendum)
Anesthesia Evaluation  Patient identified by MRN, date of birth, ID band Patient awake    Reviewed: Allergy & Precautions, H&P , NPO status , Patient's Chart, lab work & pertinent test results, reviewed documented beta blocker date and time   Airway Mallampati: II  TM Distance: >3 FB Neck ROM: full    Dental no notable dental hx. (+) Edentulous Upper, Upper Dentures, Partial Lower, Poor Dentition, Missing,    Pulmonary neg pulmonary ROS, Current Smoker,    Pulmonary exam normal breath sounds clear to auscultation       Cardiovascular Exercise Tolerance: Good negative cardio ROS   Rhythm:regular Rate:Normal     Neuro/Psych  Headaches,  Neuromuscular disease negative psych ROS   GI/Hepatic negative GI ROS, Neg liver ROS,   Endo/Other  negative endocrine ROS  Renal/GU negative Renal ROS  negative genitourinary   Musculoskeletal  (+) Arthritis , Osteoarthritis,  Fibromyalgia -, narcotic dependent  Abdominal   Peds  Hematology  (+) Blood dyscrasia, anemia ,   Anesthesia Other Findings   Reproductive/Obstetrics negative OB ROS                            Anesthesia Physical Anesthesia Plan  ASA: II  Anesthesia Plan: General   Post-op Pain Management:    Induction: Intravenous  PONV Risk Score and Plan: 3 and Ondansetron, Dexamethasone and Midazolam  Airway Management Planned: LMA  Additional Equipment:   Intra-op Plan:   Post-operative Plan:   Informed Consent: I have reviewed the patients History and Physical, chart, labs and discussed the procedure including the risks, benefits and alternatives for the proposed anesthesia with the patient or authorized representative who has indicated his/her understanding and acceptance.     Dental Advisory Given  Plan Discussed with: CRNA, Anesthesiologist and Surgeon  Anesthesia Plan Comments: ( )        Anesthesia Quick  Evaluation

## 2018-12-07 NOTE — Anesthesia Procedure Notes (Signed)
Procedure Name: LMA Insertion Date/Time: 12/07/2018 8:52 AM Performed by: Gwyndolyn Saxon, CRNA Pre-anesthesia Checklist: Patient identified, Emergency Drugs available, Suction available and Patient being monitored Patient Re-evaluated:Patient Re-evaluated prior to induction Oxygen Delivery Method: Circle System Utilized Preoxygenation: Pre-oxygenation with 100% oxygen Induction Type: IV induction Ventilation: Mask ventilation without difficulty LMA: LMA inserted LMA Size: 4.0 and 3.0 Number of attempts: 1 Airway Equipment and Method: Bite block Placement Confirmation: positive ETCO2 Tube secured with: Tape Dental Injury: Teeth and Oropharynx as per pre-operative assessment  Comments: Better seal with 3

## 2018-12-07 NOTE — Interval H&P Note (Signed)
History and Physical Interval Note:  12/07/2018 7:35 AM  Maria [name]  has presented today for surgery, with the diagnosis of LEFT KNEE ARTHROSCOPY TEAR OF LATERAL MENISCUS.  The various methods of treatment have been discussed with the patient and family. After consideration of risks, benefits and other options for treatment, the patient has consented to  Procedure(s): KNEE ARTHROSCOPY LATERAL MENISECTOMY (Left) as a surgical intervention.  The patient's history has been reviewed, patient examined, no change in status, stable for surgery.  I have reviewed the patient's chart and labs.  Questions were answered to the patient's satisfaction.     Yvette Rack

## 2018-12-07 NOTE — Discharge Instructions (Signed)
Eliquis 2.5 mg 1 pill twice a day for two weeks to prevent blood clots    When script is done start Aspirin 81 mg daily  Tramadol 50mg    1 pill q 4 hrs prn pain  Weight bearing status is touch down  Discharge medications were sent to the patient's pharmacy yesterday after discussing medication instructions throughly.  She states she could not take hydrocodone because the tylenol upset her stomach   Despite having a history of 5 prescriptions for this medication in the last 2 years.    Post Anesthesia Home Care Instructions  Activity: Get plenty of rest for the remainder of the day. A responsible individual must stay with you for 24 hours following the procedure.  For the next 24 hours, DO NOT: -Drive a car -Paediatric nurse -Drink alcoholic beverages -Take any medication unless instructed by your physician -Make any legal decisions or sign important papers.  Meals: Start with liquid foods such as gelatin or soup. Progress to regular foods as tolerated. Avoid greasy, spicy, heavy foods. If nausea and/or vomiting occur, drink only clear liquids until the nausea and/or vomiting subsides. Call your physician if vomiting continues.  Special Instructions/Symptoms: Your throat may feel dry or sore from the anesthesia or the breathing tube placed in your throat during surgery. If this causes discomfort, gargle with warm salt water. The discomfort should disappear within 24 hours.  If you had a scopolamine patch placed behind your ear for the management of post- operative nausea and/or vomiting:  1. The medication in the patch is effective for 72 hours, after which it should be removed.  Wrap patch in a tissue and discard in the trash. Wash hands thoroughly with soap and water. 2. You may remove the patch earlier than 72 hours if you experience unpleasant side effects which may include dry mouth, dizziness or visual disturbances. 3. Avoid touching the patch. Wash your hands with soap and water  after contact with the patch.

## 2018-12-07 NOTE — Transfer of Care (Signed)
Immediate Anesthesia Transfer of Care Note  Patient: Maria [name]  Procedure(s) Performed: KNEE ARTHROSCOPY LATERAL MENISECTOMY (Left Knee) KNEE ARTHROSCOPY WITH MICROFRACTURE with chondroplasty (Left Knee)  Patient Location: PACU  Anesthesia Type:General  Level of Consciousness: drowsy  Airway & Oxygen Therapy: Patient Spontanous Breathing and Patient connected to face mask oxygen  Post-op Assessment: Report given to RN and Post -op Vital signs reviewed and stable  Post vital signs: Reviewed and stable  Last Vitals:  Vitals Value Taken Time  BP 110/68 12/07/18 0938  Temp    Pulse 50 12/07/18 0942  Resp 10 12/07/18 0942  SpO2 100 % 12/07/18 0942  Vitals shown include unvalidated device data.  Last Pain:  Vitals:   12/07/18 0728  TempSrc: Oral  PainSc: 0-No pain         Complications: No apparent anesthesia complications

## 2018-12-10 ENCOUNTER — Encounter (HOSPITAL_BASED_OUTPATIENT_CLINIC_OR_DEPARTMENT_OTHER): Payer: Self-pay | Admitting: Orthopedic Surgery

## 2018-12-12 ENCOUNTER — Ambulatory Visit: Payer: Medicaid Other | Admitting: Physical Therapy

## 2018-12-12 ENCOUNTER — Ambulatory Visit: Payer: Medicaid Other | Attending: Orthopedic Surgery | Admitting: Physical Therapy

## 2018-12-12 ENCOUNTER — Other Ambulatory Visit: Payer: Self-pay

## 2018-12-12 ENCOUNTER — Encounter: Payer: Self-pay | Admitting: Physical Therapy

## 2018-12-12 DIAGNOSIS — M25562 Pain in left knee: Secondary | ICD-10-CM | POA: Insufficient documentation

## 2018-12-12 DIAGNOSIS — G8929 Other chronic pain: Secondary | ICD-10-CM | POA: Diagnosis present

## 2018-12-12 DIAGNOSIS — Z9889 Other specified postprocedural states: Secondary | ICD-10-CM | POA: Insufficient documentation

## 2018-12-12 DIAGNOSIS — R2689 Other abnormalities of gait and mobility: Secondary | ICD-10-CM | POA: Diagnosis present

## 2018-12-12 DIAGNOSIS — R600 Localized edema: Secondary | ICD-10-CM | POA: Insufficient documentation

## 2018-12-12 DIAGNOSIS — M6281 Muscle weakness (generalized): Secondary | ICD-10-CM | POA: Insufficient documentation

## 2018-12-12 NOTE — Therapy (Signed)
[place], [place], [address] Phone: [phone]   Fax:  [phone]  Physical Therapy Evaluation  Patient Details  Name: Maria [name] MRN: [id number] Date of Birth: 1965-11-16 Referring Provider (PT): Earlie Server, MD    Encounter Date: 12/12/2018  PT End of Session - 12/12/18 1127    Visit Number  1    Number of Visits  13    Date for PT Re-Evaluation  02/06/19    Authorization Type  MCD: reassess at 4th visit    PT Start Time  1104    PT Stop Time  1140    PT Time Calculation (min)  36 min    Activity Tolerance  Patient tolerated treatment well    Behavior During Therapy  [facility] for tasks assessed/performed       Past Medical History:  Diagnosis Date  . Arthritis   . Benign tumor of abdominal wall   . Chronic back pain   . DVT (deep venous thrombosis) ([place])   . Fibromyalgia   . Iron deficiency anemia   . Migraines   . PE (pulmonary embolism)     Past Surgical History:  Procedure Laterality Date  . CESAREAN SECTION    . ENDOMETRIAL ABLATION    . HERNIA REPAIR    . KNEE ARTHROSCOPY WITH DRILLING/MICROFRACTURE Left 12/07/2018   Procedure: KNEE ARTHROSCOPY WITH MICROFRACTURE with chondroplasty;  Surgeon: Earlie Server, MD;  Location: [facility];  Service: Orthopedics;  Laterality: Left;  . KNEE ARTHROSCOPY WITH MEDIAL MENISECTOMY Left 12/07/2018   Procedure: KNEE ARTHROSCOPY LATERAL MENISECTOMY;  Surgeon: Earlie Server, MD;  Location: [facility];  Service: Orthopedics;  Laterality: Left;    There were no vitals filed for this visit.   Subjective Assessment - 12/12/18 1110    Subjective  pt is a 53 y.o F s/p L knee lateral meniscus on 12/07/2018. Since surgery she reprots she still haves some soreness which is worst at night, but it has been progressively getting better. she currently utilizes crutches to ambulate. pain stays mostly in the knee with on referral.     How long can you sit comfortably?  10 - 15 min    How long can you stand comfortably?  5- 10 min    How long can you walk comfortably?  5-10 min    Diagnostic tests  x-ray at MD's    Patient Stated Goals  to get the knee back right, walk as normal as possible.    Currently in Pain?  Yes    Pain Score  7    at worst 10/10   Pain Location  Knee    Pain Orientation  Left    Pain Descriptors / Indicators  Throbbing;Aching    Pain Type  Surgical pain    Pain Onset  More than a month ago    Pain Frequency  Intermittent    Aggravating Factors   moving the knee around, touching the knee, laying down    Pain Relieving Factors  laying on the side, medication, ice/ heat    Effect of Pain on Daily Activities  limited standing/ walking         [facility] PT Assessment - 12/12/18 1108      Assessment   Medical Diagnosis  L lateral menisectomy    Referring Provider (PT)  Earlie Server, MD     Onset Date/Surgical Date  12/07/18    Hand Dominance  Right  Next MD Visit  next week    Prior Therapy  yes      Precautions   Precautions  None      Restrictions   Weight Bearing Restrictions  No      Balance Screen   Has the patient fallen in the past 6 months  No      [place] residence    Living Arrangements  Children    Available Help at Discharge  Family    Type of [place] to enter    Entrance Stairs-Number of Steps  7    Entrance Stairs-Rails  Right    [place]  One level    [place]      Prior Function   Level of Force  On disability      Cognition   Overall Cognitive Status  Within Functional Limits for tasks assessed      Observation/Other Assessments   Skin Integrity  incision sites appear clean and healing well      Posture/Postural Control   Posture/Postural Control  Postural limitations    Postural Limitations  Rounded Shoulders;Forward head       ROM / Strength   AROM / PROM / Strength  AROM;Strength;PROM      AROM   AROM Assessment Site  Knee    Right/Left Knee  Right;Left    Right Knee Extension  0    Right Knee Flexion  134    Left Knee Extension  3    Left Knee Flexion  78      PROM   PROM Assessment Site  Knee    Right/Left Knee  Left    Left Knee Extension  0    Left Knee Flexion  90      Strength   Overall Strength Comments  unable to test hip/ knee strength on L due to irritablity/ severity    Strength Assessment Site  Knee    Right/Left Knee  Left;Right    Right Knee Flexion  5/5    Right Knee Extension  5/5      Palpation   Palpation comment  TTP along lateral joint line, peri-patellar                Objective measurements completed on examination: See above findings.      [facility] PT Treatment/Exercise - 12/12/18 1108      Exercises   Exercises  Knee/Hip      Knee/Hip Exercises: Standing   Gait Training  proper form with walking using crutches to go along with LLE with she takes a step. 1 x 40 ft. frequent verbal cues to proper form and avoid taking too big of steps   pt required demonstration of technique     Knee/Hip Exercises: Seated   Heel Slides  Strengthening;1 set;Left;10 reps    Other Seated Knee/Hip Exercises  heel/ toe raise 1 x 10 ea.      Knee/Hip Exercises: Supine   Quad Sets  1 set;10 reps    Short Arc Quad Sets  1 set;10 reps;Left;Strengthening    Heel Slides  1 set;10 reps;Left             PT Education - 12/12/18 1142    Education Details  evaluation findings, POC, goals, HEP with proper form/ rationale. gait training/ mechanics with crutches to promote weight bearing  on LLE    Person(s) Educated  Patient    Methods  Explanation;Verbal cues;Handout    Comprehension  Verbalized understanding;Verbal cues required       PT Short Term Goals - 12/12/18 1149      PT SHORT TERM GOAL #1   Title  pt to be I with inital HEP    Baseline  no previous HEp     Time  3    Period  Weeks    Status  New    Target Date  01/02/19      PT SHORT TERM GOAL #2   Title  pt to verbalize/ demo techniques to reduce pain/ edmea via RICE and HEP    Baseline  only takes medication to calm down pain    Time  3    Period  Weeks    Status  New    Target Date  01/02/19        PT Long Term Goals - 12/12/18 1150      PT LONG TERM GOAL #1   Title  pt to increase L knee ROM to </= 0 to 120 for functional mobility required for ADLS with no pain    Baseline  flexion 78, extens 3 degrees with 8/10 pain    Time  6    Period  Weeks    Status  New    Target Date  02/06/19      PT LONG TERM GOAL #2   Title  increase LLE strength to >/= 4+/5 to promote knee/ hip stability with walking/ standing    Baseline  unable to MMT due to irritability/ severity at evaluation    Time  6    Period  Weeks    Status  New    Target Date  02/06/19      PT LONG TERM GOAL #3   Title  pt to be able to stand/ walk with on AD >/= 45 min and navigate up/ down >/= 12 steps reciprocally with </= 1/10 pain for functional mobility    Baseline  standing/ walking 5-10 min with crutches    Time  6    Period  Weeks    Status  New    Target Date  02/06/19      PT LONG TERM GOAL #4   Title  pt to be I with all HEP given as of last visit to maintain and progress current level of function    Baseline  no previous HEP    Time  6    Period  Weeks    Status  New    Target Date  02/06/19             Plan - 12/12/18 1142    Clinical Impression Statement  pt presents to OPPT s/p L knee lateral menisectomy on 12/07/2018. pt exhibits limited knee ROM and irritability as expected following surgery. unable to properly test LLE strength due to irritability/ severity. TTP peri-patellar and bil joint line. pt demos antalgic gait pattern with crutches and no weight bearing on LLE. She would benefit from physical therapy to decrease L knee pain, improve ROM, promote proper gait/ efficency and  maximize overall function by addressing the deficits listed.    Personal Factors and Comorbidities  Comorbidity 2    Comorbidities  Hx of DVT, PE    Examination-Activity Limitations  Stand;Stairs;Bend;Locomotion Level    Stability/Clinical Decision Making  Evolving/Moderate complexity    Clinical Decision Making  Moderate  Rehab Potential  Good    PT Frequency  2x / week    PT Duration  8 weeks   initial MCD auth 1 x a week for 3 weeks.   PT Treatment/Interventions  ADLs/Self Care Home Management;Cryotherapy;Electrical Stimulation;Iontophoresis 4mg /ml Dexamethasone;Moist Heat;Ultrasound;Therapeutic activities;Therapeutic exercise;Balance training;Manual techniques;Patient/family education;Passive range of motion;Taping;Vasopneumatic Device    PT Next Visit Plan  review/ update HEP PRN, gait training, add weight shfiting, knee mobs, strengthening, vaso for pain/ swelling    PT Home Exercise Plan  heel slides (seated and supine), SAQ, heel/ toe raise       Patient will benefit from skilled therapeutic intervention in order to improve the following deficits and impairments:  Improper body mechanics, Postural dysfunction, Abnormal gait, Pain, Increased fascial restricitons, Decreased activity tolerance, Decreased balance, Decreased knowledge of use of DME, Increased edema, Decreased endurance  Visit Diagnosis: 1. S/P lateral meniscectomy of left knee   2. Chronic pain of left knee   3. Localized edema   4. Other abnormalities of gait and mobility   5. Muscle weakness (generalized)        Problem List Patient Active Problem List   Diagnosis Date Noted  . Fibromyalgia muscle pain 01/13/2014  . Chronic back pain 01/13/2014  . Iron deficiency anemia 11/15/2011  . Pulmonary embolism, bilateral ([place]) 06/03/2011   Starr Lake PT, DPT, LAT, ATC  12/12/18  11:54 AM      [facility] [address] [place], [place],  [address] Phone: [phone]   Fax:  [phone]  Name: Deyjah Kindel MRN: [id number] Date of Birth: June 21, 1965

## 2018-12-20 ENCOUNTER — Telehealth: Payer: Self-pay | Admitting: Physical Therapy

## 2018-12-20 ENCOUNTER — Ambulatory Visit: Payer: Medicaid Other | Attending: Orthopedic Surgery | Admitting: Physical Therapy

## 2018-12-20 NOTE — Telephone Encounter (Signed)
Contacted patient regaurding no-show visit. The patient reports she just forgot. She reports she will be at the next visit.

## 2018-12-25 ENCOUNTER — Ambulatory Visit: Payer: Medicaid Other | Admitting: Physical Therapy

## 2019-01-01 ENCOUNTER — Ambulatory Visit: Payer: Medicaid Other | Admitting: Physical Therapy

## 2019-01-01 ENCOUNTER — Telehealth: Payer: Self-pay | Admitting: Physical Therapy

## 2019-01-01 NOTE — Telephone Encounter (Signed)
Spoke with pt regarding missed appointment today and with it being her 2nd missed appointment we can, per clinic policy, schedule only 1 visit at a time. Also, today was the last scheduled visit and if she wanted to continued with physical therapy to call us back to schedule that visit. If she felt she no longer requires physical therapy we can discharge her.  While typing the telephone encounter pt called back stating "her knee popped out and excruciating" and that she was at the MD now. She stated she would call us back and let us know how they decided to move forward.

## 2019-02-07 ENCOUNTER — Emergency Department (HOSPITAL_COMMUNITY): Payer: Medicaid Other

## 2019-02-07 ENCOUNTER — Other Ambulatory Visit: Payer: Self-pay

## 2019-02-07 DIAGNOSIS — Y9289 Other specified places as the place of occurrence of the external cause: Secondary | ICD-10-CM | POA: Diagnosis not present

## 2019-02-07 DIAGNOSIS — F17210 Nicotine dependence, cigarettes, uncomplicated: Secondary | ICD-10-CM | POA: Insufficient documentation

## 2019-02-07 DIAGNOSIS — R2242 Localized swelling, mass and lump, left lower limb: Secondary | ICD-10-CM | POA: Diagnosis present

## 2019-02-07 DIAGNOSIS — Y9389 Activity, other specified: Secondary | ICD-10-CM | POA: Diagnosis not present

## 2019-02-07 DIAGNOSIS — Z79899 Other long term (current) drug therapy: Secondary | ICD-10-CM | POA: Diagnosis not present

## 2019-02-07 DIAGNOSIS — Y999 Unspecified external cause status: Secondary | ICD-10-CM | POA: Insufficient documentation

## 2019-02-07 DIAGNOSIS — X509XXA Other and unspecified overexertion or strenuous movements or postures, initial encounter: Secondary | ICD-10-CM | POA: Diagnosis not present

## 2019-02-07 DIAGNOSIS — M79662 Pain in left lower leg: Secondary | ICD-10-CM | POA: Diagnosis not present

## 2019-02-07 DIAGNOSIS — M7989 Other specified soft tissue disorders: Secondary | ICD-10-CM | POA: Insufficient documentation

## 2019-02-07 MED ORDER — ALBUTEROL SULFATE (2.5 MG/3ML) 0.083% IN NEBU: 5.0000 mg | INHALATION_SOLUTION | Freq: Once | RESPIRATORY_TRACT | Status: DC

## 2019-02-08 ENCOUNTER — Emergency Department (HOSPITAL_COMMUNITY)
Admission: EM | Admit: 2019-02-08 | Discharge: 2019-02-08 | Disposition: A | Discharge status: Home / Self Care | Payer: Medicaid Other | Attending: Emergency Medicine | Admitting: Emergency Medicine

## 2019-02-08 ENCOUNTER — Encounter (HOSPITAL_COMMUNITY): Payer: Self-pay | Admitting: Emergency Medicine

## 2019-02-08 ENCOUNTER — Ambulatory Visit (HOSPITAL_COMMUNITY): Payer: Medicaid Other

## 2019-02-08 DIAGNOSIS — M7989 Other specified soft tissue disorders: Secondary | ICD-10-CM

## 2019-02-08 MED ORDER — ENOXAPARIN SODIUM 100 MG/ML ~~LOC~~ SOLN
1.5000 mg/kg | Freq: Once | SUBCUTANEOUS | Status: AC
  Administered 2019-02-08: 95 mg via SUBCUTANEOUS
  Filled 2019-02-08: qty 0.95

## 2019-02-08 MED ORDER — RIVAROXABAN (XARELTO) VTE STARTER PACK (15 & 20 MG): ORAL_TABLET | ORAL | 0 refills | Status: DC

## 2019-02-08 NOTE — ED Triage Notes (Signed)
Patient is complaining of left leg swelling and pain. Patient states that she had knee surgery on 12/07/18. Patient was helping move daughter in the dorm yesterday and her leg became swollen and black.

## 2019-02-08 NOTE — ED Provider Notes (Signed)
[facility] DEPT Provider Note   CSN: [id number] Arrival date & time: 02/07/19  2321     History   Chief Complaint Chief Complaint  Patient presents with  . Leg Swelling    HPI Maria [name] is a 53 y.o. female.     53 year old female with a history of DVT and PE, fibromyalgia presents to the emergency department for evaluation of left lower extremity swelling.  She states that she has noticed this over the past 2 days after helping her daughter move homes.  She denies doing anything strenuous or having any trauma or injury to her left lower extremity.  She has been wearing her knee brace since arthroscopic he on 12/07/2018.  Reports this has not been helping her symptoms.  Symptoms associated with some pain in her left lower extremity radiating from her knee distally.  This improved with anti-inflammatories and 5 mg oxycodone.  She is not currently on chronic anticoagulation.  No language interpreter was used.    Past Medical History:  Diagnosis Date  . Arthritis   . Benign tumor of abdominal wall   . Chronic back pain   . DVT (deep venous thrombosis) ([place])   . Fibromyalgia   . Iron deficiency anemia   . Migraines   . PE (pulmonary embolism)     Patient Active Problem List   Diagnosis Date Noted  . Fibromyalgia muscle pain 01/13/2014  . Chronic back pain 01/13/2014  . Iron deficiency anemia 11/15/2011  . Pulmonary embolism, bilateral ([place]) 06/03/2011    Past Surgical History:  Procedure Laterality Date  . CESAREAN SECTION    . ENDOMETRIAL ABLATION    . HERNIA REPAIR    . KNEE ARTHROSCOPY WITH DRILLING/MICROFRACTURE Left 12/07/2018   Procedure: KNEE ARTHROSCOPY WITH MICROFRACTURE with chondroplasty;  Surgeon: Earlie Server, MD;  Location: [facility];  Service: Orthopedics;  Laterality: Left;  . KNEE ARTHROSCOPY WITH MEDIAL MENISECTOMY Left 12/07/2018   Procedure: KNEE ARTHROSCOPY LATERAL MENISECTOMY;  Surgeon: Earlie Server, MD;  Location: [facility];  Service: Orthopedics;  Laterality: Left;     OB History   No obstetric history on file.      Home Medications    Prior to Admission medications   Medication Sig Start Date End Date Taking? Authorizing Provider  alprazolam Duanne Moron) 2 MG tablet Take 2 mg by mouth daily as needed for anxiety.  12/04/18  Yes [provider]  meloxicam (MOBIC) 15 MG tablet Take 15 mg by mouth daily. 12/20/18  Yes [provider]  oxyCODONE (OXY IR/ROXICODONE) 5 MG immediate release tablet Take 5 mg by mouth every 4 (four) hours as needed for severe pain.   Yes [provider]  promethazine (PHENERGAN) 12.5 MG tablet Take 1 tablet (12.5 mg total) by mouth every 6 (six) hours as needed for nausea or vomiting. Patient not taking: Reported on 02/08/2019 12/07/18   Matthew Saras, PA-C  Rivaroxaban 15 & 20 MG TBPK Take as directed on package: Start with one 15mg  tablet by mouth twice a day with food. On Day 22, switch to one 20mg  tablet once a day with food. 02/08/19   Antonietta Breach, PA-C    Family History Family History  Problem Relation Age of Onset  . Cancer Mother   . Cancer Father   . Diabetes Sister   . Hypertension Sister     Social History Social History   Tobacco Use  . Smoking status: Current Every Day Smoker  Packs/day: 0.25    Types: Cigarettes  . Smokeless tobacco: Never Used  . Tobacco comment: 1 pack/week  Substance Use Topics  . Alcohol use: No  . Drug use: No     Allergies   Gabapentin   Review of Systems Review of Systems Ten systems reviewed and are negative for acute change, except as noted in the HPI.    Physical Exam Updated Vital Signs BP (!) 131/99 (BP Location: Left Arm)   Pulse 72   Temp 98.1 F (36.7 C) (Oral)   Resp 16   Ht 5\' 3"  (1.6 m)   Wt 62.6 kg   SpO2 100%   BMI 24.45 kg/m   Physical Exam Vitals signs and nursing note reviewed.  Constitutional:      General: She is  not in acute distress.    Appearance: She is well-developed. She is not diaphoretic.     Comments: Nontoxic-appearing and in no distress  HENT:     Head: Normocephalic and atraumatic.  Eyes:     General: No scleral icterus.    Conjunctiva/sclera: Conjunctivae normal.  Neck:     Musculoskeletal: Normal range of motion.  Cardiovascular:     Rate and Rhythm: Normal rate and regular rhythm.     Pulses: Normal pulses.     Comments: DP pulse 2+ in the left lower extremity Pulmonary:     Effort: Pulmonary effort is normal. No respiratory distress.     Comments: Respirations even and unlabored Musculoskeletal: Normal range of motion.     Comments: Mild, nonpitting soft tissue swelling distal to the left knee without erythema or heat to touch.  No crepitus, calf tenderness, palpable cords.  Preserved range of motion of the left lower extremity.  Skin:    General: Skin is warm and dry.     Coloration: Skin is not pale.     Findings: No erythema or rash.  Neurological:     Mental Status: She is alert and oriented to person, place, and time.  Psychiatric:        Behavior: Behavior normal.      ED Treatments / Results  Labs (all labs ordered are listed, but only abnormal results are displayed) Labs Reviewed - No data to display  EKG None  Radiology Dg Chest 2 View  Result Date: 02/08/2019 CLINICAL DATA:  Shortness of breath, leg swelling for 2 days, history of knee surgery and pulmonary embolism CT EXAM: CHEST - 2 VIEW COMPARISON:  Radiograph 03/02/2016, CTA 10/29/2017 FINDINGS: No consolidation, features of edema, pneumothorax, or effusion. Pulmonary vascularity is normally distributed. The cardiomediastinal contours are unremarkable. No acute osseous or soft tissue abnormality. IMPRESSION: No acute cardiopulmonary abnormality Electronically Signed   By: Lovena Le M.D.   On: 02/08/2019 00:03    Procedures Procedures (including critical care time)  Medications Ordered in ED  Medications  albuterol (PROVENTIL) (2.5 MG/3ML) 0.083% nebulizer solution 5 mg (0 mg Nebulization Hold 02/08/19 0006)  enoxaparin (LOVENOX) injection 95 mg (95 mg Subcutaneous Given 02/08/19 0246)     Initial Impression / Assessment and Plan / ED Course  I have reviewed the triage vital signs and the nursing notes.  Pertinent labs & imaging results that were available during my care of the patient were reviewed by me and considered in my medical decision making (see chart for details).        53 year old female presents for lower extremity swelling x2 days.  She has a warm extremity with good palpable DP pulses.  No palpable cords or calf tenderness on exam.  No crepitus, signs of cellulitis.  She does have remote history of DVT and PE, but is not chronically anticoagulated.  Unable to exclude DVT at this time.  Symptoms atraumatic in onset and I do not feel that x-ray would be beneficial.  She has been treated with IM Lovenox with instruction to return in the morning for venous duplex.  Given prescription for Xarelto to start if imaging positive for DVT.  Otherwise, patient stable for follow-up with her orthopedic and/your primary care doctor.  Patient discharged in stable condition.   Final Clinical Impressions(s) / ED Diagnoses   Final diagnoses:  Leg swelling    ED Discharge Orders         Ordered    LE VENOUS     02/08/19 0234    Rivaroxaban 15 & 20 MG TBPK     02/08/19 0234           Antonietta Breach, PA-C 02/08/19 0515    Fatima Blank, MD 02/08/19 [phone]

## 2019-04-05 ENCOUNTER — Emergency Department (HOSPITAL_COMMUNITY)
Admission: EM | Admit: 2019-04-05 | Discharge: 2019-04-05 | Disposition: A | Discharge status: Home / Self Care | Payer: Medicaid Other | Attending: Emergency Medicine | Admitting: Emergency Medicine

## 2019-04-05 ENCOUNTER — Encounter (HOSPITAL_COMMUNITY): Payer: Self-pay

## 2019-04-05 ENCOUNTER — Emergency Department (HOSPITAL_COMMUNITY): Payer: Medicaid Other

## 2019-04-05 ENCOUNTER — Other Ambulatory Visit: Payer: Self-pay

## 2019-04-05 DIAGNOSIS — M25562 Pain in left knee: Secondary | ICD-10-CM | POA: Diagnosis not present

## 2019-04-05 DIAGNOSIS — F17210 Nicotine dependence, cigarettes, uncomplicated: Secondary | ICD-10-CM | POA: Diagnosis not present

## 2019-04-05 DIAGNOSIS — Z79899 Other long term (current) drug therapy: Secondary | ICD-10-CM | POA: Insufficient documentation

## 2019-04-05 DIAGNOSIS — Z791 Long term (current) use of non-steroidal anti-inflammatories (NSAID): Secondary | ICD-10-CM | POA: Diagnosis not present

## 2019-04-05 MED ORDER — OXYCODONE-ACETAMINOPHEN 5-325 MG PO TABS
2.0000 | ORAL_TABLET | Freq: Once | ORAL | Status: AC
  Administered 2019-04-05: 2 via ORAL
  Filled 2019-04-05: qty 2

## 2019-04-05 MED ORDER — HYDROCODONE-ACETAMINOPHEN 5-325 MG PO TABS: 1.0000 | ORAL_TABLET | Freq: Four times a day (QID) | ORAL | 0 refills | Status: DC | PRN

## 2019-04-05 NOTE — ED Notes (Signed)
Pt refused discharge vitals because she was "too far from the machine" and already in a knee immobilizer and crutches

## 2019-04-05 NOTE — ED Provider Notes (Signed)
[facility] DEPT Provider Note   CSN: [id number] Arrival date & time: 04/05/19  1256     History   Chief Complaint Chief Complaint  Patient presents with  . Knee Pain    HPI Maria [name] is a 53 y.o. female.     The history is provided by the patient and medical records. No language interpreter was used.     Maria [name] is a 53 y.o. female  with a PMH as listed below including previous DVT, fibromyalgia, chronic knee pain who presents to the Emergency Department complaining of acute worsening of her chronic right knee pain which began today.  Patient states that she got up from out of bed this morning and stepped on her right leg when knee twisted oddly and she heard a pop.  Immediately had worsening pain and has noticed some mild swelling since.  Reports being unable to ambulate on the extremity since this injury this morning.  Called her primary care doctor who recommended that she come to the emergency department.  She has not reached out to her orthopedic doctor yet.  She is followed by [facility].   Past Medical History:  Diagnosis Date  . Arthritis   . Benign tumor of abdominal wall   . Chronic back pain   . DVT (deep venous thrombosis) ([place])   . Fibromyalgia   . Iron deficiency anemia   . Migraines   . PE (pulmonary embolism)     Patient Active Problem List   Diagnosis Date Noted  . Fibromyalgia muscle pain 01/13/2014  . Chronic back pain 01/13/2014  . Iron deficiency anemia 11/15/2011  . Pulmonary embolism, bilateral ([place]) 06/03/2011    Past Surgical History:  Procedure Laterality Date  . CESAREAN SECTION    . ENDOMETRIAL ABLATION    . HERNIA REPAIR    . KNEE ARTHROSCOPY WITH DRILLING/MICROFRACTURE Left 12/07/2018   Procedure: KNEE ARTHROSCOPY WITH MICROFRACTURE with chondroplasty;  Surgeon: Earlie Server, MD;  Location: [facility];  Service: Orthopedics;  Laterality: Left;  . KNEE  ARTHROSCOPY WITH MEDIAL MENISECTOMY Left 12/07/2018   Procedure: KNEE ARTHROSCOPY LATERAL MENISECTOMY;  Surgeon: Earlie Server, MD;  Location: [facility];  Service: Orthopedics;  Laterality: Left;     OB History   No obstetric history on file.      Home Medications    Prior to Admission medications   Medication Sig Start Date End Date Taking? Authorizing Provider  alprazolam Duanne Moron) 2 MG tablet Take 2 mg by mouth daily as needed for anxiety.  12/04/18   [provider]  HYDROcodone-acetaminophen (NORCO/VICODIN) 5-325 MG tablet Take 1 tablet by mouth every 6 (six) hours as needed for severe pain. 04/05/19   , Ozella Almond, PA-C  meloxicam (MOBIC) 15 MG tablet Take 15 mg by mouth daily. 12/20/18   [provider]  oxyCODONE (OXY IR/ROXICODONE) 5 MG immediate release tablet Take 5 mg by mouth every 4 (four) hours as needed for severe pain.    [provider]  promethazine (PHENERGAN) 12.5 MG tablet Take 1 tablet (12.5 mg total) by mouth every 6 (six) hours as needed for nausea or vomiting. Patient not taking: Reported on 02/08/2019 12/07/18   Matthew Saras, PA-C  Rivaroxaban 15 & 20 MG TBPK Take as directed on package: Start with one 15mg  tablet by mouth twice a day with food. On Day 22, switch to one 20mg  tablet once a day with food. 02/08/19  Antonietta Breach, PA-C    Family History Family History  Problem Relation Age of Onset  . Cancer Mother   . Cancer Father   . Diabetes Sister   . Hypertension Sister     Social History Social History   Tobacco Use  . Smoking status: Current Every Day Smoker    Packs/day: 0.15    Types: Cigarettes  . Smokeless tobacco: Never Used  . Tobacco comment: 1 pack/week  Substance Use Topics  . Alcohol use: No  . Drug use: No     Allergies   Gabapentin   Review of Systems Review of Systems  Respiratory: Negative for shortness of breath.   Cardiovascular: Negative for chest pain.   Musculoskeletal: Positive for arthralgias, joint swelling and myalgias.  Skin: Negative for color change and wound.  Neurological: Negative for weakness and numbness.     Physical Exam Updated Vital Signs BP (!) 159/90 (BP Location: Left Arm)   Pulse 62   Temp 97.6 F (36.4 C) (Oral)   Resp 18   Ht 5\' 5"  (1.651 m)   Wt 60.3 kg   SpO2 100%   BMI 22.13 kg/m   Physical Exam Vitals signs and nursing note reviewed.  Constitutional:      General: She is not in acute distress.    Appearance: She is well-developed.  HENT:     Head: Normocephalic and atraumatic.  Neck:     Musculoskeletal: Neck supple.  Cardiovascular:     Rate and Rhythm: Normal rate and regular rhythm.     Heart sounds: Normal heart sounds. No murmur.  Pulmonary:     Effort: Pulmonary effort is normal. No respiratory distress.     Breath sounds: Normal breath sounds. No wheezing or rales.  Musculoskeletal: Normal range of motion.     Comments: Diffuse tenderness anteriorly of the left knee.  Decreased range of motion likely secondary to pain.  Very minimal joint swelling appreciated.  No calf tenderness or lower extremity swelling. No abnormal alignment or patellar mobility. No bruising, erythema or warmth overlaying the joint.  She does have some crepitus of the knee with flexion.  Ligaments appear grossly intact, but difficult to assess given pain and cooperation. 2+ DP pulses bilaterally. All compartments are soft. Sensation intact distal to injury.  Skin:    General: Skin is warm and dry.  Neurological:     Mental Status: She is alert.      ED Treatments / Results  Labs (all labs ordered are listed, but only abnormal results are displayed) Labs Reviewed - No data to display  EKG None  Radiology Dg Knee Complete 4 Views Left  Result Date: 04/05/2019 CLINICAL DATA:  Left knee injury. EXAM: LEFT KNEE - COMPLETE 4+ VIEW COMPARISON:  No recent prior. FINDINGS: Small knee joint effusion. Tricompartment  degenerative change. No acute bony or joint abnormality identified. No evidence of fracture dislocation. IMPRESSION: Small knee joint effusion. Tricompartment degenerative change. No acute bony abnormality. Electronically Signed   By: Marcello Moores  Register   On: 04/05/2019 13:53    Procedures Procedures (including critical care time)  Medications Ordered in ED Medications  oxyCODONE-acetaminophen (PERCOCET/ROXICET) 5-325 MG per tablet 2 tablet (2 tablets Oral Given 04/05/19 1421)     Initial Impression / Assessment and Plan / ED Course  I have reviewed the triage vital signs and the nursing notes.  Pertinent labs & imaging results that were available during my care of the patient were reviewed by me and considered in  my medical decision making (see chart for details).       Maria [name] is a 53 y.o. female who presents to ED for acute worsening of her chronic left knee pain which began today after injury this morning.  She heard a pop and since then has had worsening pain and swelling.  On exam, bilateral lower extremities were neurovascularly intact.  No erythema, warmth or swelling to suggest a septic joint. X-ray without acute bony abnormalities. Will place in knee immobilizer and given crutches.  Orthopedics strongly encouraged.  Recommended that she call today to schedule follow-up appointment.  Nonweightbearing until seen by ortho. Return precautions discussed and all questions answered.    Final Clinical Impressions(s) / ED Diagnoses   Final diagnoses:  Acute pain of left knee    ED Discharge Orders         Ordered    HYDROcodone-acetaminophen (NORCO/VICODIN) 5-325 MG tablet  Every 6 hours PRN     04/05/19 1433           , Ozella Almond, PA-C 04/05/19 1450    Wyvonnia Dusky, MD 04/05/19 1819

## 2019-04-05 NOTE — Discharge Instructions (Signed)
It was my pleasure taking care of you today!   Wear knee immobilizer and stay off leg until you are seen by orthopedics. Ice and elevate knee throughout the day.  Call your orthopedist today to schedule follow up appointment.   Return to the ER for new or worsening symptoms, any additional concerns.

## 2019-05-28 ENCOUNTER — Other Ambulatory Visit (HOSPITAL_COMMUNITY): Payer: Self-pay | Admitting: Orthopedic Surgery

## 2019-05-28 ENCOUNTER — Ambulatory Visit (HOSPITAL_COMMUNITY): Payer: Medicaid Other

## 2019-05-28 DIAGNOSIS — M79606 Pain in leg, unspecified: Secondary | ICD-10-CM

## 2019-05-28 DIAGNOSIS — M7989 Other specified soft tissue disorders: Secondary | ICD-10-CM

## 2019-05-28 DIAGNOSIS — M79605 Pain in left leg: Secondary | ICD-10-CM

## 2019-05-29 ENCOUNTER — Ambulatory Visit (HOSPITAL_COMMUNITY): Payer: Medicaid Other | Attending: Orthopedic Surgery

## 2019-08-06 ENCOUNTER — Ambulatory Visit (INDEPENDENT_AMBULATORY_CARE_PROVIDER_SITE_OTHER): Payer: Medicaid Other | Admitting: Orthopaedic Surgery

## 2019-08-06 ENCOUNTER — Other Ambulatory Visit: Payer: Self-pay

## 2019-08-06 DIAGNOSIS — G8929 Other chronic pain: Secondary | ICD-10-CM

## 2019-08-06 DIAGNOSIS — M1712 Unilateral primary osteoarthritis, left knee: Secondary | ICD-10-CM | POA: Diagnosis not present

## 2019-08-06 DIAGNOSIS — M25562 Pain in left knee: Secondary | ICD-10-CM | POA: Diagnosis not present

## 2019-08-06 NOTE — Progress Notes (Signed)
Office Visit Note   Patient: Maria [name]           Date of Birth: 1966/02/23           MRN: [id number] Visit Date: 08/06/2019              Requested by: Nolene Ebbs, MD [id number] [place],  [place] [id number] PCP: Nolene Ebbs, MD   Assessment & Plan: Visit Diagnoses:  1. Chronic pain of left knee   2. Unilateral primary osteoarthritis, left knee     Plan: Unfortunately her only option to help with her pain at this point would likely be knee replacement surgery.  I explained to her in detail what this involves.  She states that that was recommended to her and she is trying to avoid that being so young.  She understands that it will be a very painful surgery given her young age but she would need to push through the pain to get her knee bending and moving to have the best outcome.  All question concerns were answered addressed.  Follow-up will be as needed.  Follow-Up Instructions: Return if symptoms worsen or fail to improve.   Orders:  No orders of the defined types were placed in this encounter.  No orders of the defined types were placed in this encounter.     Procedures: No procedures performed   Clinical Data: No additional findings.   Subjective: Chief Complaint  Patient presents with  . Left Knee - Pain  The patient comes in for second opinion as it relates to her left knee.  Several months ago she underwent a left knee arthroscopy with a lateral meniscectomy as well as microfracture and chondroplasty of the lateral compartment.  Her knee is continued to hurt and a MRI was obtained of her knee by the other physician who did her knee arthroscopy.  This was to further assess the cartilage in her knee.  Her knee hurts on a daily basis and it swells.  She tries anti-inflammatories.  She cannot have hyaluronic acid due to insurance issues.  She tries Tylenol arthritis and she is tried other topical anti-inflammatories.  Her pain is daily and it is detrimentally  affecting her mobility, her quality of life and her activities daily living.  She is seeking a second opinion as it relates to her knee.  She does state that a knee replacement has been recommended.  She is a smoker.  She had been on blood thinning medication 1 time for DVT but is off of that.  Her right knee does hurt her now.  Both knees swell.  She does not tolerate aspirations or injections.  She states that she passed out one time from this.  HPI  Review of Systems She currently denies any headache, chest pain, shortness of breath, fever, chills, nausea, vomiting  Objective: Vital Signs: There were no vitals taken for this visit.  Physical Exam She is alert and orient x3 and in no acute distress.  She does walk with a limp. Ortho Exam She is a very thin individual.  Both knees have pain with flexion extension.  Both knees are slightly warm with mild effusions.  Both knees hurt globally with the left knee much worse. Specialty Comments:  No specialty comments available.  Imaging: No results found. The MRI of her left knee from November does show areas of full-thickness cartilage loss of the lateral compartment of her knee with subchondral edema as well.  PMFS History: Patient  Active Problem List   Diagnosis Date Noted  . Unilateral primary osteoarthritis, left knee 08/06/2019  . Fibromyalgia muscle pain 01/13/2014  . Chronic back pain 01/13/2014  . Iron deficiency anemia 11/15/2011  . Pulmonary embolism, bilateral ([place]) 06/03/2011   Past Medical History:  Diagnosis Date  . Arthritis   . Benign tumor of abdominal wall   . Chronic back pain   . DVT (deep venous thrombosis) ([place])   . Fibromyalgia   . Iron deficiency anemia   . Migraines   . PE (pulmonary embolism)     Family History  Problem Relation Age of Onset  . Cancer Mother   . Cancer Father   . Diabetes Sister   . Hypertension Sister     Past Surgical History:  Procedure Laterality Date  . CESAREAN SECTION      . ENDOMETRIAL ABLATION    . HERNIA REPAIR    . KNEE ARTHROSCOPY WITH DRILLING/MICROFRACTURE Left 12/07/2018   Procedure: KNEE ARTHROSCOPY WITH MICROFRACTURE with chondroplasty;  Surgeon: Earlie Server, MD;  Location: [facility];  Service: Orthopedics;  Laterality: Left;  . KNEE ARTHROSCOPY WITH MEDIAL MENISECTOMY Left 12/07/2018   Procedure: KNEE ARTHROSCOPY LATERAL MENISECTOMY;  Surgeon: Earlie Server, MD;  Location: [facility];  Service: Orthopedics;  Laterality: Left;   Social History   Occupational History  . Not on file  Tobacco Use  . Smoking status: Current Every Day Smoker    Packs/day: 0.15    Types: Cigarettes  . Smokeless tobacco: Never Used  . Tobacco comment: 1 pack/week  Substance and Sexual Activity  . Alcohol use: No  . Drug use: No  . Sexual activity: Not on file

## 2019-08-12 ENCOUNTER — Emergency Department (HOSPITAL_COMMUNITY)
Admission: EM | Admit: 2019-08-12 | Discharge: 2019-08-12 | Disposition: A | Discharge status: Home / Self Care | Payer: Medicaid Other | Attending: Emergency Medicine | Admitting: Emergency Medicine

## 2019-08-12 ENCOUNTER — Emergency Department (HOSPITAL_COMMUNITY): Payer: Medicaid Other

## 2019-08-12 ENCOUNTER — Encounter (HOSPITAL_COMMUNITY): Payer: Self-pay | Admitting: Emergency Medicine

## 2019-08-12 DIAGNOSIS — F17210 Nicotine dependence, cigarettes, uncomplicated: Secondary | ICD-10-CM | POA: Diagnosis not present

## 2019-08-12 DIAGNOSIS — R911 Solitary pulmonary nodule: Secondary | ICD-10-CM

## 2019-08-12 DIAGNOSIS — Z79899 Other long term (current) drug therapy: Secondary | ICD-10-CM | POA: Insufficient documentation

## 2019-08-12 DIAGNOSIS — R0789 Other chest pain: Secondary | ICD-10-CM | POA: Insufficient documentation

## 2019-08-12 LAB — TROPONIN I (HIGH SENSITIVITY)
Troponin I (High Sensitivity): <2 ng/L (ref <18)
Troponin I (High Sensitivity): <2 ng/L (ref <18)

## 2019-08-12 LAB — CBC
HCT: 42.1 % (ref 36.0–46.0)
Hemoglobin: 13.3 g/dL (ref 12.0–15.0)
MCH: 28.1 pg (ref 26.0–34.0)
MCHC: 31.6 g/dL (ref 30.0–36.0)
MCV: 88.8 fL (ref 80.0–100.0)
Platelets: 348 10*3/uL (ref 150–400)
RBC: 4.74 MIL/uL (ref 3.87–5.11)
RDW: 13.7 % (ref 11.5–15.5)
WBC: 4.8 10*3/uL (ref 4.0–10.5)
nRBC: 0 % (ref 0.0–0.2)

## 2019-08-12 LAB — BASIC METABOLIC PANEL
Anion gap: 10 (ref 5–15)
BUN: 10 mg/dL (ref 6–20)
CO2: 27 mmol/L (ref 22–32)
Calcium: 9.4 mg/dL (ref 8.9–10.3)
Chloride: 102 mmol/L (ref 98–111)
Creatinine, Ser: 0.79 mg/dL (ref 0.44–1.00)
GFR calc Af Amer: >60 mL/min (ref >60)
GFR calc non Af Amer: >60 mL/min (ref >60)
Glucose, Bld: 96 mg/dL (ref 70–99)
Potassium: 4.6 mmol/L (ref 3.5–5.1)
Sodium: 139 mmol/L (ref 135–145)

## 2019-08-12 LAB — I-STAT BETA HCG BLOOD, ED (MC, WL, AP ONLY): I-stat hCG, quantitative: <5 m[IU]/mL (ref <5)

## 2019-08-12 LAB — D-DIMER, QUANTITATIVE: D-Dimer, Quant: 0.71 ug/mL-FEU — ABNORMAL HIGH (ref 0.00–0.50)

## 2019-08-12 MED ORDER — ETODOLAC 300 MG PO CAPS: 300.0000 mg | ORAL_CAPSULE | Freq: Three times a day (TID) | ORAL | 0 refills | Status: DC

## 2019-08-12 MED ORDER — DIAZEPAM 5 MG PO TABS
5.0000 mg | ORAL_TABLET | Freq: Once | ORAL | Status: AC
  Administered 2019-08-12: 5 mg via ORAL
  Filled 2019-08-12: qty 1

## 2019-08-12 MED ORDER — OXYCODONE HCL 5 MG PO TABS
5.0000 mg | ORAL_TABLET | Freq: Once | ORAL | Status: AC
  Administered 2019-08-12: 5 mg via ORAL
  Filled 2019-08-12: qty 1

## 2019-08-12 MED ORDER — ACETAMINOPHEN 500 MG PO TABS
1000.0000 mg | ORAL_TABLET | Freq: Once | ORAL | Status: AC
  Administered 2019-08-12: 1000 mg via ORAL
  Filled 2019-08-12: qty 2

## 2019-08-12 MED ORDER — KETOROLAC TROMETHAMINE 15 MG/ML IJ SOLN
15.0000 mg | Freq: Once | INTRAMUSCULAR | Status: AC
  Administered 2019-08-12: 15 mg via INTRAVENOUS
  Filled 2019-08-12: qty 1

## 2019-08-12 MED ORDER — IOHEXOL 350 MG/ML SOLN
100.0000 mL | Freq: Once | INTRAVENOUS | Status: AC | PRN
  Administered 2019-08-12: 100 mL via INTRAVENOUS

## 2019-08-12 MED ORDER — SODIUM CHLORIDE 0.9% FLUSH: 3.0000 mL | Freq: Once | INTRAVENOUS | Status: DC

## 2019-08-12 NOTE — ED Provider Notes (Signed)
Clinical Course as of Aug 11 1746  Mon Aug 12, 2019  1707 CT negative for acute finding.  Lung nodule noted.   [JK]    Clinical Course User Index [JK] Dorie Rank, MD   Stable for discharge    Dorie Rank, MD 08/12/19 [phone]

## 2019-08-12 NOTE — Discharge Instructions (Signed)
Follow-up with your primary care doctor.  Consider further outpatient evaluation such as a stress test.  Take the medications as needed for pain and discomfort.  An incidental lung nodule was noted on CT scan.  These are common to find but in smokers a repeat CT scan in 6-12 months to make sure it is not changing is recommended

## 2019-08-12 NOTE — ED Triage Notes (Signed)
Pt in via [facility] with central cp that radiates to back. The pt states this happens frequently, but pain got worse after moving chair today, and pain is worse w/deep breath and palpation. Pain worse under R scapula. Denies any n/v or sob.

## 2019-08-12 NOTE — ED Provider Notes (Signed)
[facility] EMERGENCY DEPARTMENT Provider Note   CSN: [id number] Arrival date & time: 08/12/19  1138     History Chief Complaint  Patient presents with  . Chest Pain  . Back Pain    Maria [name] is a 54 y.o. female.  54 yo F with a cc of chest pain.  Right sided sharp.  Denies trauma.  Worse with movement palpation, deep breathing.  No hemoptysis.  No recent surgery or immobilization.   Denies HX of MI, has hx of HTN, smoker.  Denies hld, dm.  No fam hx of MI.   Hx of PE x 2.  Feels somewhat similar to past.   The history is provided by the patient.  Chest Pain Pain location:  L chest Pain radiates to:  Does not radiate Pain severity:  No pain Onset quality:  Sudden Duration:  2 days Timing:  Constant Progression:  Worsening Chronicity:  New Associated symptoms: back pain   Associated symptoms: no dizziness, no fever, no headache, no nausea, no palpitations, no shortness of breath and no vomiting   Back Pain Associated symptoms: chest pain   Associated symptoms: no dysuria, no fever and no headaches        Past Medical History:  Diagnosis Date  . Arthritis   . Benign tumor of abdominal wall   . Chronic back pain   . DVT (deep venous thrombosis) ([place])   . Fibromyalgia   . Iron deficiency anemia   . Migraines   . PE (pulmonary embolism)     Patient Active Problem List   Diagnosis Date Noted  . Unilateral primary osteoarthritis, left knee 08/06/2019  . Fibromyalgia muscle pain 01/13/2014  . Chronic back pain 01/13/2014  . Iron deficiency anemia 11/15/2011  . Pulmonary embolism, bilateral ([place]) 06/03/2011    Past Surgical History:  Procedure Laterality Date  . CESAREAN SECTION    . ENDOMETRIAL ABLATION    . HERNIA REPAIR    . KNEE ARTHROSCOPY WITH DRILLING/MICROFRACTURE Left 12/07/2018   Procedure: KNEE ARTHROSCOPY WITH MICROFRACTURE with chondroplasty;  Surgeon: Earlie Server, MD;  Location: [facility];  Service:  Orthopedics;  Laterality: Left;  . KNEE ARTHROSCOPY WITH MEDIAL MENISECTOMY Left 12/07/2018   Procedure: KNEE ARTHROSCOPY LATERAL MENISECTOMY;  Surgeon: Earlie Server, MD;  Location: [facility];  Service: Orthopedics;  Laterality: Left;     OB History   No obstetric history on file.     Family History  Problem Relation Age of Onset  . Cancer Mother   . Cancer Father   . Diabetes Sister   . Hypertension Sister     Social History   Tobacco Use  . Smoking status: Current Every Day Smoker    Packs/day: 0.15    Types: Cigarettes  . Smokeless tobacco: Never Used  . Tobacco comment: 1 pack/week  Substance Use Topics  . Alcohol use: No  . Drug use: No    Home Medications Prior to Admission medications   Medication Sig Start Date End Date Taking? Authorizing Provider  alprazolam Duanne Moron) 2 MG tablet Take 2 mg by mouth daily as needed for anxiety.  12/04/18   [provider]  HYDROcodone-acetaminophen (NORCO/VICODIN) 5-325 MG tablet Take 1 tablet by mouth every 6 (six) hours as needed for severe pain. 04/05/19   Ward, Ozella Almond, PA-C  meloxicam (MOBIC) 15 MG tablet Take 15 mg by mouth daily. 12/20/18   [provider]  oxyCODONE (OXY IR/ROXICODONE) 5 MG immediate release tablet  Take 5 mg by mouth every 4 (four) hours as needed for severe pain.    [provider]  promethazine (PHENERGAN) 12.5 MG tablet Take 1 tablet (12.5 mg total) by mouth every 6 (six) hours as needed for nausea or vomiting. Patient not taking: Reported on 02/08/2019 12/07/18   Matthew Saras, PA-C  Rivaroxaban 15 & 20 MG TBPK Take as directed on package: Start with one 15mg  tablet by mouth twice a day with food. On Day 22, switch to one 20mg  tablet once a day with food. 02/08/19   Antonietta Breach, PA-C    Allergies    Gabapentin  Review of Systems   Review of Systems  Constitutional: Negative for chills and fever.  HENT: Negative for congestion and rhinorrhea.     Eyes: Negative for redness and visual disturbance.  Respiratory: Negative for shortness of breath and wheezing.   Cardiovascular: Positive for chest pain. Negative for palpitations.  Gastrointestinal: Negative for nausea and vomiting.  Genitourinary: Negative for dysuria and urgency.  Musculoskeletal: Positive for back pain. Negative for arthralgias and myalgias.  Skin: Negative for pallor and wound.  Neurological: Negative for dizziness and headaches.    Physical Exam Updated Vital Signs BP 136/90   Pulse (!) 51   Temp 98.1 F (36.7 C) (Oral)   Resp 15   SpO2 100%   Physical Exam Vitals and nursing note reviewed.  Constitutional:      General: She is not in acute distress.    Appearance: She is well-developed. She is not diaphoretic.  HENT:     Head: Normocephalic and atraumatic.  Eyes:     Pupils: Pupils are equal, round, and reactive to light.  Cardiovascular:     Rate and Rhythm: Normal rate and regular rhythm.     Heart sounds: No murmur. No friction rub. No gallop.   Pulmonary:     Effort: Pulmonary effort is normal.     Breath sounds: No wheezing or rales.  Abdominal:     General: There is no distension.     Palpations: Abdomen is soft.     Tenderness: There is no abdominal tenderness.  Musculoskeletal:        General: No tenderness.     Cervical back: Normal range of motion and neck supple.  Skin:    General: Skin is warm and dry.  Neurological:     Mental Status: She is alert and oriented to person, place, and time.  Psychiatric:        Behavior: Behavior normal.     ED Results / Procedures / Treatments   Labs (all labs ordered are listed, but only abnormal results are displayed) Labs Reviewed  D-DIMER, QUANTITATIVE (NOT AT [facility]) - Abnormal; Notable for the following components:      Result Value   D-Dimer, Quant 0.71 (*)    All other components within normal limits  BASIC METABOLIC PANEL  CBC  I-STAT BETA HCG BLOOD, ED ([facility], [facility], [facility] ONLY)   TROPONIN I (HIGH SENSITIVITY)  TROPONIN I (HIGH SENSITIVITY)    EKG EKG Interpretation  Date/Time:  Monday August 12 2019 11:49:51 EST Ventricular Rate:  50 PR Interval:  154 QRS Duration: 76 QT Interval:  432 QTC Calculation: 393 R Axis:   39 Text Interpretation: Sinus bradycardia Septal infarct , age undetermined Abnormal ECG flipped t wave in v2 not seen on prior, ? lead placement Otherwise no significant change Confirmed by Deno Etienne [id number]) on 08/12/2019 12:47:41 PM   Radiology DG Chest  2 View  Result Date: 08/12/2019 CLINICAL DATA:  Chest pain EXAM: CHEST - 2 VIEW COMPARISON:  02/07/2019 FINDINGS: The heart size and mediastinal contours are within normal limits. Both lungs are clear. The visualized skeletal structures are unremarkable. IMPRESSION: No active cardiopulmonary disease. Electronically Signed   By: Davina Poke D.O.   On: 08/12/2019 12:10    Procedures Procedures (including critical care time)  Medications Ordered in ED Medications  sodium chloride flush (NS) 0.9 % injection 3 mL (has no administration in time range)  acetaminophen (TYLENOL) tablet 1,000 mg (1,000 mg Oral Given 08/12/19 1350)  ketorolac (TORADOL) 15 MG/ML injection 15 mg (15 mg Intravenous Given 08/12/19 1351)  oxyCODONE (Oxy IR/ROXICODONE) immediate release tablet 5 mg (5 mg Oral Given 08/12/19 1350)  diazepam (VALIUM) tablet 5 mg (5 mg Oral Given 08/12/19 1351)    ED Course  I have reviewed the triage vital signs and the nursing notes.  Pertinent labs & imaging results that were available during my care of the patient were reviewed by me and considered in my medical decision making (see chart for details).    MDM Rules/Calculators/A&P                      54 yo F with a cc of chest pain.  Chest pain is atypical in nature and reproduced on exam.  Unfortunate the patient does have a history of multiple PEs and is supposed to be on anticoagulation but has not been taking it for some  time.  D-dimer was sent and was elevated.  Will obtain a CT angiogram of the chest to evaluate for pulmonary embolism.  Signed out to Dr. Tomi Bamberger, please see his note for further details care in the ED.  The patients results and plan were reviewed and discussed.   Any x-rays performed were independently reviewed by myself.   Differential diagnosis were considered with the presenting HPI.  Medications  sodium chloride flush (NS) 0.9 % injection 3 mL (has no administration in time range)  acetaminophen (TYLENOL) tablet 1,000 mg (1,000 mg Oral Given 08/12/19 1350)  ketorolac (TORADOL) 15 MG/ML injection 15 mg (15 mg Intravenous Given 08/12/19 1351)  oxyCODONE (Oxy IR/ROXICODONE) immediate release tablet 5 mg (5 mg Oral Given 08/12/19 1350)  diazepam (VALIUM) tablet 5 mg (5 mg Oral Given 08/12/19 1351)    Vitals:   08/12/19 1330 08/12/19 1400 08/12/19 1430 08/12/19 1445  BP: (!) 165/88 (!) 154/92 (!) 149/86 136/90  Pulse:      Resp: 14 14 (!) 21 15  Temp:      TempSrc:      SpO2:        Final diagnoses:  Atypical chest pain     Final Clinical Impression(s) / ED Diagnoses Final diagnoses:  Atypical chest pain    Rx / DC Orders ED Discharge Orders    None       Deno Etienne, DO 08/12/19 1539

## 2019-08-14 ENCOUNTER — Ambulatory Visit: Payer: Medicaid Other | Attending: Internal Medicine | Admitting: Physical Therapy

## 2020-06-15 IMAGING — CR CHEST - 2 VIEW
2 series · 2 of 2 positions shown · non-contrast
Comparison: Radiograph 03/02/2016, CTA 10/29/2017

CLINICAL DATA: Shortness of breath, leg swelling for 2 days,
history of knee surgery and pulmonary embolism CT

EXAM:
CHEST - 2 VIEW

[w chest pa]
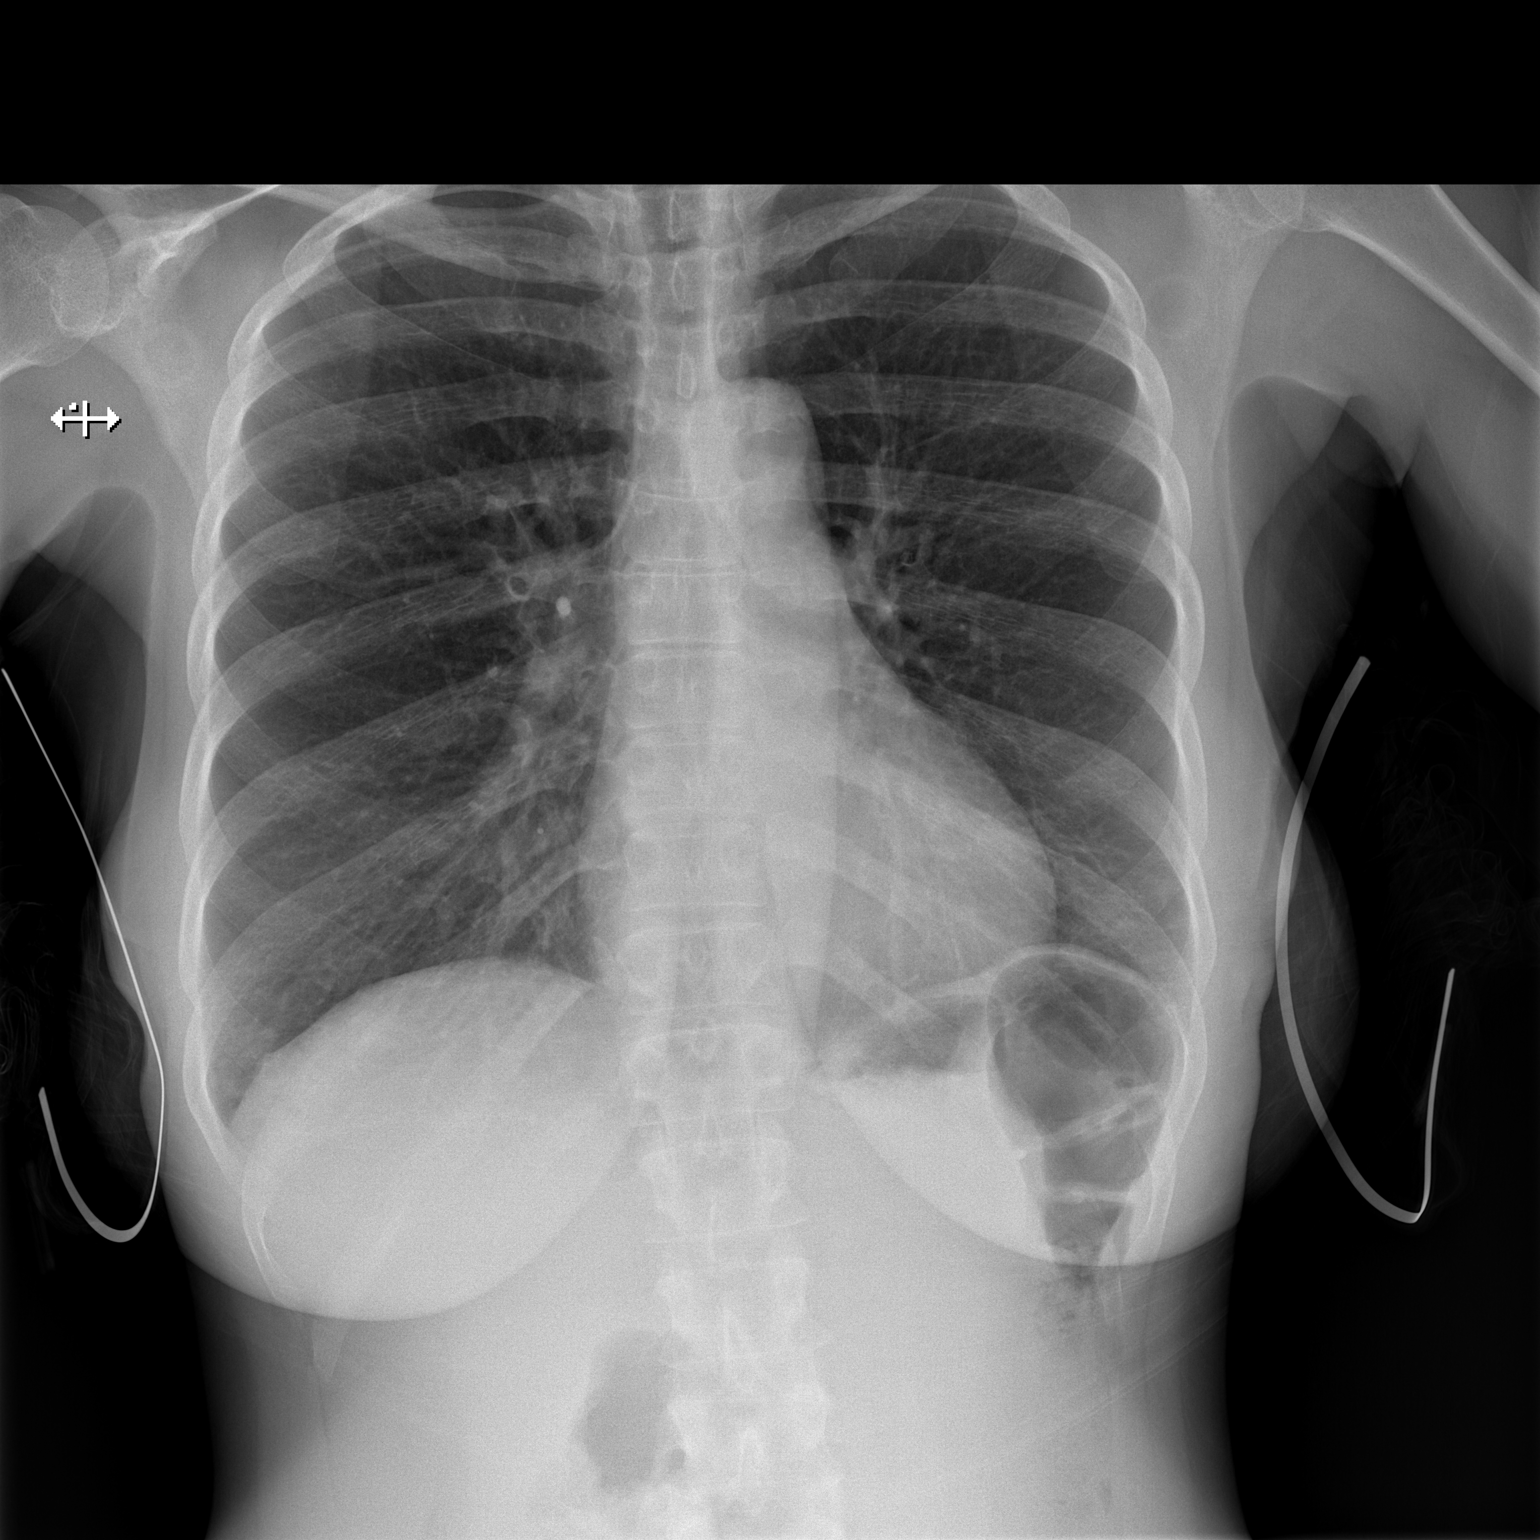

[w chest lat]
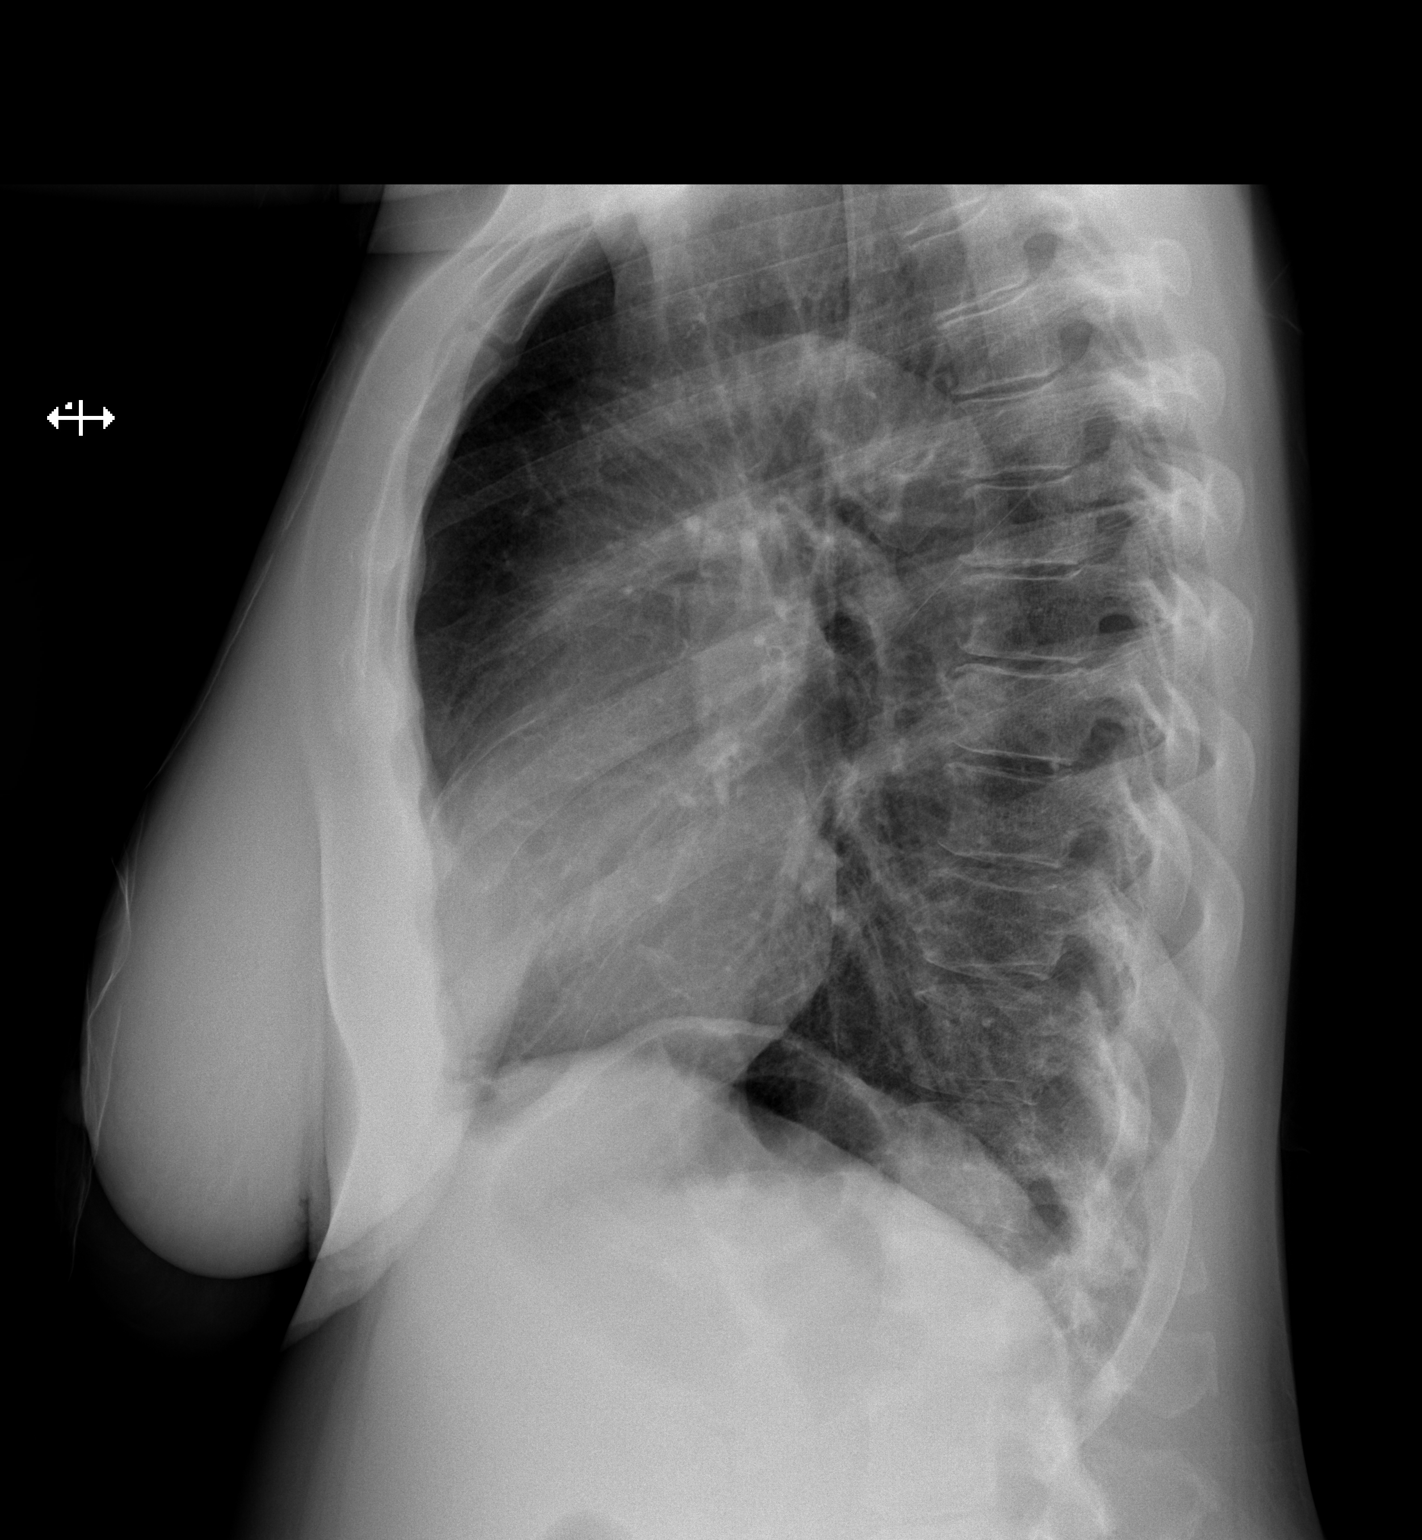

[2 of 2 positions shown; findings below may reference images not displayed]

FINDINGS: No consolidation, features of edema, pneumothorax, or effusion.
Pulmonary vascularity is normally distributed. The cardiomediastinal
contours are unremarkable. No acute osseous or soft tissue
abnormality.
IMPRESSION: No acute cardiopulmonary abnormality

## 2020-08-21 ENCOUNTER — Other Ambulatory Visit: Payer: Self-pay | Admitting: Orthopedic Surgery

## 2020-08-21 DIAGNOSIS — M79606 Pain in leg, unspecified: Secondary | ICD-10-CM

## 2020-09-08 ENCOUNTER — Other Ambulatory Visit: Payer: Self-pay

## 2020-09-08 ENCOUNTER — Ambulatory Visit: Payer: Medicaid Other | Admitting: Podiatry

## 2020-09-08 DIAGNOSIS — L84 Corns and callosities: Secondary | ICD-10-CM | POA: Diagnosis not present

## 2020-09-08 DIAGNOSIS — M79674 Pain in right toe(s): Secondary | ICD-10-CM | POA: Diagnosis not present

## 2020-09-08 DIAGNOSIS — B351 Tinea unguium: Secondary | ICD-10-CM | POA: Diagnosis not present

## 2020-09-08 DIAGNOSIS — D689 Coagulation defect, unspecified: Secondary | ICD-10-CM | POA: Diagnosis not present

## 2020-09-08 DIAGNOSIS — M79675 Pain in left toe(s): Secondary | ICD-10-CM | POA: Diagnosis not present

## 2020-09-13 ENCOUNTER — Encounter: Payer: Self-pay | Admitting: Podiatry

## 2020-09-13 NOTE — Progress Notes (Signed)
Subjective: Maria [name] presents today referred by Nolene Ebbs, MD for complaint of callus(es) b/l and painful thick toenails that are difficult to trim. Painful toenails interfere with ambulation. Aggravating factors include wearing enclosed shoe gear. Painful calluses are aggravated when weightbearing with and without shoegear.   She has h/o DVT and PE. Is on chronic anticoagulant. She states she had a suspected MI in her doctor's office in 2010 or 2011.   Past Medical History:  Diagnosis Date  . Arthritis   . Benign tumor of abdominal wall   . Chronic back pain   . DVT (deep venous thrombosis) ([place])   . Fibromyalgia   . Iron deficiency anemia   . Migraines   . PE (pulmonary embolism)      Patient Active Problem List   Diagnosis Date Noted  . Unilateral primary osteoarthritis, left knee 08/06/2019  . Fibromyalgia muscle pain 01/13/2014  . Chronic back pain 01/13/2014  . Iron deficiency anemia 11/15/2011  . Pulmonary embolism, bilateral ([place]) 06/03/2011     Past Surgical History:  Procedure Laterality Date  . CESAREAN SECTION    . ENDOMETRIAL ABLATION    . HERNIA REPAIR    . KNEE ARTHROSCOPY WITH DRILLING/MICROFRACTURE Left 12/07/2018   Procedure: KNEE ARTHROSCOPY WITH MICROFRACTURE with chondroplasty;  Surgeon: Earlie Server, MD;  Location: [place];  Service: Orthopedics;  Laterality: Left;  . KNEE ARTHROSCOPY WITH MEDIAL MENISECTOMY Left 12/07/2018   Procedure: KNEE ARTHROSCOPY LATERAL MENISECTOMY;  Surgeon: Earlie Server, MD;  Location: [place];  Service: Orthopedics;  Laterality: Left;     Current Outpatient Medications on File Prior to Visit  Medication Sig Dispense Refill  . alprazolam (XANAX) 2 MG tablet Take 2 mg by mouth in the morning and at bedtime.     Marland Kitchen amitriptyline (ELAVIL) 25 MG tablet Take 25 mg by mouth daily as needed.    Marland Kitchen escitalopram (LEXAPRO) 10 MG tablet SMARTSIG:1 Tablet(s) By Mouth Every Evening    .  etodolac (LODINE) 300 MG capsule Take 1 capsule (300 mg total) by mouth every 8 (eight) hours. 21 capsule 0  . FLOWFLEX COVID-19 AG HOME TEST KIT See admin instructions.    Marland Kitchen GABAPENTIN PO     . hydrochlorothiazide (HYDRODIURIL) 12.5 MG tablet Take 12.5 mg by mouth daily.    . hydrOXYzine (ATARAX/VISTARIL) 25 MG tablet Take 25 mg by mouth 2 (two) times daily as needed.    . MORPHINE SULFATE ER PO     . NORTRIPTYLINE HCL PO     . oxyCODONE-acetaminophen (PERCOCET) 10-325 MG tablet     . Rivaroxaban 15 & 20 MG TBPK Take as directed on package: Start with one 90m tablet by mouth twice a day with food. On Day 22, switch to one 240mtablet once a day with food. (Patient not taking: Reported on 08/12/2019) 51 each 0  . tiZANidine (ZANAFLEX) 4 MG tablet Take 4 mg by mouth 3 (three) times daily as needed.    . TOPIRAMATE PO     . Vitamin D, Ergocalciferol, (DRISDOL) 1.25 MG (50000 UNIT) CAPS capsule Take 50,000 Units by mouth every Monday.    . WARFARIN SODIUM PO      No current facility-administered medications on file prior to visit.     Allergies  Allergen Reactions  . Hydrocodone Nausea And Vomiting    N/V lasted a few days     Social History   Occupational History  . Not on file  Tobacco Use  .  Smoking status: Current Every Day Smoker    Packs/day: 0.15    Types: Cigarettes  . Smokeless tobacco: Never Used  . Tobacco comment: 1 pack/week  Vaping Use  . Vaping Use: Never used  Substance and Sexual Activity  . Alcohol use: No  . Drug use: No  . Sexual activity: Not on file     Family History  Problem Relation Age of Onset  . Cancer Mother   . Cancer Father   . Diabetes Sister   . Hypertension Sister       There is no immunization history on file for this patient.   Objective: Maria [name] is a pleasant 55 y.o. female in NAD. AAO x 3.  There were no vitals filed for this visit.  Vascular Examination:  Capillary fill time to digits <3 seconds b/l lower extremities.  Palpable DP pulse(s) b/l lower extremities Faintly palpable PT pulse(s) b/l lower extremities. Pedal hair sparse. Lower extremity skin temperature gradient within normal limits. No pain with calf compression b/l.  Dermatological Examination: Pedal skin with normal turgor, texture and tone bilaterally. No open wounds bilaterally. No interdigital macerations bilaterally. Toenails 1-5 b/l elongated, discolored, dystrophic, thickened, crumbly with subungual debris and tenderness to dorsal palpation. Hyperkeratotic lesion(s) L 2nd toe, L 3rd toe and right foot.  No erythema, no edema, no drainage, no fluctuance.  Musculoskeletal: Normal muscle strength 5/5 to all lower extremity muscle groups bilaterally. No pain crepitus or joint limitation noted with ROM b/l. Hammertoes noted to the b/l lower extremities. Pes planus deformity noted b/l.   Neurological: Protective sensation intact 5/5 intact bilaterally with 10g monofilament b/l. Vibratory sensation intact b/l.  Assessment: 1. Pain due to onychomycosis of toenails of both feet   2. Corns and callosities   3. Coagulation disorder ([place])    Plan: -Examined patient. -Patient to continue soft, supportive shoe gear daily. -Toenails 1-5 b/l were debrided in length and girth with sterile nail nippers and dremel without iatrogenic bleeding.  -Corn(s) L 2nd toe and L 3rd toe and callus(es) right foot were pared utilizing sterile scalpel blade without incident. Total number debrided =3. -Patient to report any pedal injuries to medical professional immediately. -Patient/POA to call should there be question/concern in the interim.  Return in about 3 months (around 12/09/2020).  Marzetta Board, DPM

## 2020-10-12 ENCOUNTER — Ambulatory Visit: Payer: Self-pay | Admitting: Physician Assistant

## 2020-10-12 NOTE — H&P (View-Only) (Signed)
TOTAL KNEE ADMISSION H&P  Patient is being admitted for left total knee arthroplasty.  Subjective:  Chief Complaint:left knee pain.  HPI: Maria [name], 55 y.o. female, has a history of pain and functional disability in the left knee due to arthritis and has failed non-surgical conservative treatments for greater than 12 weeks to includeNSAID's and/or analgesics and activity modification.  Onset of symptoms was gradual, starting 4 years ago with gradually worsening course since that time. The patient noted no past surgery on the left knee(s).  Patient currently rates pain in the left knee(s) at 8 out of 10 with activity. Patient has night pain, worsening of pain with activity and weight bearing, pain that interferes with activities of daily living, pain with passive range of motion, crepitus and joint swelling.  Patient has evidence of periarticular osteophytes and joint space narrowing by imaging studies. There is no active infection.  Patient Active Problem List   Diagnosis Date Noted  . Unilateral primary osteoarthritis, left knee 08/06/2019  . Fibromyalgia muscle pain 01/13/2014  . Chronic back pain 01/13/2014  . Iron deficiency anemia 11/15/2011  . Pulmonary embolism, bilateral ([place]) 06/03/2011   Past Medical History:  Diagnosis Date  . Arthritis   . Benign tumor of abdominal wall   . Chronic back pain   . DVT (deep venous thrombosis) ([place])   . Fibromyalgia   . Iron deficiency anemia   . Migraines   . PE (pulmonary embolism)     Past Surgical History:  Procedure Laterality Date  . CESAREAN SECTION    . ENDOMETRIAL ABLATION    . HERNIA REPAIR    . KNEE ARTHROSCOPY WITH DRILLING/MICROFRACTURE Left 12/07/2018   Procedure: KNEE ARTHROSCOPY WITH MICROFRACTURE with chondroplasty;  Surgeon: Earlie Server, MD;  Location: [place];  Service: Orthopedics;  Laterality: Left;  . KNEE ARTHROSCOPY WITH MEDIAL MENISECTOMY Left 12/07/2018   Procedure: KNEE ARTHROSCOPY  LATERAL MENISECTOMY;  Surgeon: Earlie Server, MD;  Location: [place];  Service: Orthopedics;  Laterality: Left;    Current Outpatient Medications  Medication Sig Dispense Refill Last Dose  . alprazolam (XANAX) 2 MG tablet Take 2 mg by mouth 2 (two) times daily as needed for anxiety.     Marland Kitchen escitalopram (LEXAPRO) 10 MG tablet Take 10 mg by mouth every evening.     . etodolac (LODINE) 300 MG capsule Take 1 capsule (300 mg total) by mouth every 8 (eight) hours. (Patient not taking: Reported on 10/08/2020) 21 capsule 0   . hydrOXYzine (ATARAX/VISTARIL) 25 MG tablet Take 25 mg by mouth in the morning and at bedtime.     . pregabalin (LYRICA) 150 MG capsule Take 150 mg by mouth in the morning, at noon, and at bedtime.     . Rivaroxaban 15 & 20 MG TBPK Take as directed on package: Start with one 15mg  tablet by mouth twice a day with food. On Day 22, switch to one 20mg  tablet once a day with food. 51 each 0   . tiZANidine (ZANAFLEX) 4 MG tablet Take 4 mg by mouth 3 (three) times daily.     . Vitamin D, Ergocalciferol, (DRISDOL) 1.25 MG (50000 UNIT) CAPS capsule Take 50,000 Units by mouth every Monday.      No current facility-administered medications for this visit.   Allergies  Allergen Reactions  . Hydrocodone Nausea And Vomiting    N/V lasted a few days    Social History   Tobacco Use  . Smoking status: Current Every Day  Smoker    Packs/day: 0.15    Types: Cigarettes  . Smokeless tobacco: Never Used  . Tobacco comment: 1 pack/week  Substance Use Topics  . Alcohol use: No    Family History  Problem Relation Age of Onset  . Cancer Mother   . Cancer Father   . Diabetes Sister   . Hypertension Sister      Review of Systems  Musculoskeletal: Positive for arthralgias.  All other systems reviewed and are negative.   Objective:  Physical Exam Constitutional:      General: She is not in acute distress.    Appearance: Normal appearance.  HENT:     Head:  Normocephalic and atraumatic.  Eyes:     Extraocular Movements: Extraocular movements intact.     Pupils: Pupils are equal, round, and reactive to light.  Cardiovascular:     Rate and Rhythm: Normal rate and regular rhythm.     Pulses: Normal pulses.     Heart sounds: Normal heart sounds.  Pulmonary:     Effort: Pulmonary effort is normal. No respiratory distress.     Breath sounds: Normal breath sounds.  Abdominal:     General: Abdomen is flat. Bowel sounds are normal. There is no distension.     Palpations: Abdomen is soft.     Tenderness: There is no abdominal tenderness.  Musculoskeletal:     Left knee: Swelling and bony tenderness present. Tenderness present.  Skin:    General: Skin is warm and dry.     Findings: No erythema or rash.  Neurological:     General: No focal deficit present.     Mental Status: She is alert and oriented to person, place, and time.  Psychiatric:        Mood and Affect: Mood normal.        Behavior: Behavior normal.     Vital signs in last 24 hours: @VSRANGES @  Labs:   Estimated body mass index is 22.13 kg/m as calculated from the following:   Height as of 04/05/19: 5\' 5"  (1.651 m).   Weight as of 04/05/19: 60.3 kg.   Imaging Review Plain radiographs demonstrate moderate degenerative joint disease of the left knee(s). The overall alignment ismild valgus. The bone quality appears to be good for age and reported activity level.      Assessment/Plan:  End stage arthritis, left knee   The patient history, physical examination, clinical judgment of the provider and imaging studies are consistent with end stage degenerative joint disease of the left knee(s) and total knee arthroplasty is deemed medically necessary. The treatment options including medical management, injection therapy arthroscopy and arthroplasty were discussed at length. The risks and benefits of total knee arthroplasty were presented and reviewed. The risks due to aseptic  loosening, infection, stiffness, patella tracking problems, thromboembolic complications and other imponderables were discussed. The patient acknowledged the explanation, agreed to proceed with the plan and consent was signed. Patient is being admitted for inpatient treatment for surgery, pain control, PT, OT, prophylactic antibiotics, VTE prophylaxis, progressive ambulation and ADL's and discharge planning. The patient is planning to be discharged home with home health services    Anticipated LOS equal to or greater than 2 midnights due to - Age 71 and older with one or more of the following:  - Obesity  - Expected need for hospital services (PT, OT, Nursing) required for safe  discharge  - Anticipated need for postoperative skilled nursing care or inpatient rehab  - Active co-morbidities:  None OR   - Unanticipated findings during/Post Surgery: None  - Patient is a high risk of re-admission due to: Barriers to post-acute care (logistical, no family support in home)

## 2020-10-12 NOTE — H&P (Signed)
TOTAL KNEE ADMISSION H&P  Patient is being admitted for left total knee arthroplasty.  Subjective:  Chief Complaint:left knee pain.  HPI: Mella Sharpnack, 55 y.o. female, has a history of pain and functional disability in the left knee due to arthritis and has failed non-surgical conservative treatments for greater than 12 weeks to includeNSAID's and/or analgesics and activity modification.  Onset of symptoms was gradual, starting 4 years ago with gradually worsening course since that time. The patient noted no past surgery on the left knee(s).  Patient currently rates pain in the left knee(s) at 8 out of 10 with activity. Patient has night pain, worsening of pain with activity and weight bearing, pain that interferes with activities of daily living, pain with passive range of motion, crepitus and joint swelling.  Patient has evidence of periarticular osteophytes and joint space narrowing by imaging studies. There is no active infection.  Patient Active Problem List   Diagnosis Date Noted  . Unilateral primary osteoarthritis, left knee 08/06/2019  . Fibromyalgia muscle pain 01/13/2014  . Chronic back pain 01/13/2014  . Iron deficiency anemia 11/15/2011  . Pulmonary embolism, bilateral ([facility]) 06/03/2011   Past Medical History:  Diagnosis Date  . Arthritis   . Benign tumor of abdominal wall   . Chronic back pain   . DVT (deep venous thrombosis) ([facility])   . Fibromyalgia   . Iron deficiency anemia   . Migraines   . PE (pulmonary embolism)     Past Surgical History:  Procedure Laterality Date  . CESAREAN SECTION    . ENDOMETRIAL ABLATION    . HERNIA REPAIR    . KNEE ARTHROSCOPY WITH DRILLING/MICROFRACTURE Left 12/07/2018   Procedure: KNEE ARTHROSCOPY WITH MICROFRACTURE with chondroplasty;  Surgeon: Caffrey, Daniel, MD;  Location: [facility];  Service: Orthopedics;  Laterality: Left;  . KNEE ARTHROSCOPY WITH MEDIAL MENISECTOMY Left 12/07/2018   Procedure: KNEE ARTHROSCOPY  LATERAL MENISECTOMY;  Surgeon: Caffrey, Daniel, MD;  [facility];  Service: Orthopedics;  Laterality: Left;    Current Outpatient Medications  Medication Sig Dispense Refill Last Dose  . alprazolam (XANAX) 2 MG tablet Take 2 mg by mouth 2 (two) times daily as needed for anxiety.     . escitalopram (LEXAPRO) 10 MG tablet Take 10 mg by mouth every evening.     . etodolac (LODINE) 300 MG capsule Take 1 capsule (300 mg total) by mouth every 8 (eight) hours. (Patient not taking: Reported on 10/08/2020) 21 capsule 0   . hydrOXYzine (ATARAX/VISTARIL) 25 MG tablet Take 25 mg by mouth in the morning and at bedtime.     . pregabalin (LYRICA) 150 MG capsule Take 150 mg by mouth in the morning, at noon, and at bedtime.     . Rivaroxaban 15 & 20 MG TBPK Take as directed on package: Start with one 15mg tablet by mouth twice a day with food. On Day 22, switch to one 20mg tablet once a day with food. 51 each 0   . tiZANidine (ZANAFLEX) 4 MG tablet Take 4 mg by mouth 3 (three) times daily.     . Vitamin D, Ergocalciferol, (DRISDOL) 1.25 MG (50000 UNIT) CAPS capsule Take 50,000 Units by mouth every Monday.      No current facility-administered medications for this visit.   Allergies  Allergen Reactions  . Hydrocodone Nausea And Vomiting    N/V lasted a few days    Social History   Tobacco Use  . Smoking status: Current Every Day   Smoker    Packs/day: 0.15    Types: Cigarettes  . Smokeless tobacco: Never Used  . Tobacco comment: 1 pack/week  Substance Use Topics  . Alcohol use: No    Family History  Problem Relation Age of Onset  . Cancer Mother   . Cancer Father   . Diabetes Sister   . Hypertension Sister      Review of Systems  Musculoskeletal: Positive for arthralgias.  All other systems reviewed and are negative.   Objective:  Physical Exam Constitutional:      General: She is not in acute distress.    Appearance: Normal appearance.  HENT:     Head:  Normocephalic and atraumatic.  Eyes:     Extraocular Movements: Extraocular movements intact.     Pupils: Pupils are equal, round, and reactive to light.  Cardiovascular:     Rate and Rhythm: Normal rate and regular rhythm.     Pulses: Normal pulses.     Heart sounds: Normal heart sounds.  Pulmonary:     Effort: Pulmonary effort is normal. No respiratory distress.     Breath sounds: Normal breath sounds.  Abdominal:     General: Abdomen is flat. Bowel sounds are normal. There is no distension.     Palpations: Abdomen is soft.     Tenderness: There is no abdominal tenderness.  Musculoskeletal:     Left knee: Swelling and bony tenderness present. Tenderness present.  Skin:    General: Skin is warm and dry.     Findings: No erythema or rash.  Neurological:     General: No focal deficit present.     Mental Status: She is alert and oriented to person, place, and time.  Psychiatric:        Mood and Affect: Mood normal.        Behavior: Behavior normal.     Vital signs in last 24 hours: @VSRANGES@  Labs:   Estimated body mass index is 22.13 kg/m as calculated from the following:   Height as of 04/05/19: 5' 5" (1.651 m).   Weight as of 04/05/19: 60.3 kg.   Imaging Review Plain radiographs demonstrate moderate degenerative joint disease of the left knee(s). The overall alignment ismild valgus. The bone quality appears to be good for age and reported activity level.      Assessment/Plan:  End stage arthritis, left knee   The patient history, physical examination, clinical judgment of the provider and imaging studies are consistent with end stage degenerative joint disease of the left knee(s) and total knee arthroplasty is deemed medically necessary. The treatment options including medical management, injection therapy arthroscopy and arthroplasty were discussed at length. The risks and benefits of total knee arthroplasty were presented and reviewed. The risks due to aseptic  loosening, infection, stiffness, patella tracking problems, thromboembolic complications and other imponderables were discussed. The patient acknowledged the explanation, agreed to proceed with the plan and consent was signed. Patient is being admitted for inpatient treatment for surgery, pain control, PT, OT, prophylactic antibiotics, VTE prophylaxis, progressive ambulation and ADL's and discharge planning. The patient is planning to be discharged home with home health services    Anticipated LOS equal to or greater than 2 midnights due to - Age 65 and older with one or more of the following:  - Obesity  - Expected need for hospital services (PT, OT, Nursing) required for safe  discharge  - Anticipated need for postoperative skilled nursing care or inpatient rehab  - Active co-morbidities:   None OR   - Unanticipated findings during/Post Surgery: None  - Patient is a high risk of re-admission due to: Barriers to post-acute care (logistical, no family support in home)

## 2020-10-13 NOTE — Patient Instructions (Addendum)
DUE TO COVID-19 ONLY ONE VISITOR IS ALLOWED TO COME WITH YOU AND STAY IN THE WAITING ROOM ONLY DURING PRE OP AND PROCEDURE DAY OF SURGERY. THE 1 VISITOR  MAY VISIT WITH YOU AFTER SURGERY IN YOUR PRIVATE ROOM DURING VISITING HOURS ONLY!  YOU NEED TO HAVE A COVID 19 TEST ON: 10/21/20 @ 11:00 AM , THIS TEST MUST BE DONE BEFORE SURGERY,  COVID TESTING SITE [place] [address], IT IS ON THE RIGHT GOING OUT [address] APPROXIMATELY  2 MINUTES PAST [employer] ON THE RIGHT. ONCE YOUR COVID TEST IS COMPLETED,  PLEASE BEGIN THE QUARANTINE INSTRUCTIONS AS OUTLINED IN YOUR HANDOUT.                Maria [name]    Your procedure is scheduled on: 10/23/20   Report to [place]  Entrance   Report to short stay at: 5:15 AM     Call this number if you have problems the morning of surgery [phone]    Remember:   NO SOLID FOOD AFTER MIDNIGHT THE NIGHT PRIOR TO SURGERY. NOTHING BY MOUTH EXCEPT CLEAR LIQUIDS UNTIL: 4:30 AM . PLEASE FINISH ENSURE DRINK PER SURGEON ORDER  WHICH NEEDS TO BE COMPLETED AT: 4:30 AM .  CLEAR LIQUID DIET  Foods Allowed                                                                     Foods Excluded  Coffee and tea, regular and decaf                             liquids that you cannot  Plain Jell-O any favor except red or purple                                           see through such as: Fruit ices (not with fruit pulp)                                     milk, soups, orange juice  Iced Popsicles                                    All solid food Carbonated beverages, regular and diet                                    Cranberry, grape and apple juices Sports drinks like Gatorade Lightly seasoned clear broth or consume(fat free) Sugar, honey syrup  Sample Menu Breakfast                                Lunch  Supper Cranberry juice                    Beef broth                             Chicken broth Jell-O                                     Grape juice                           Apple juice Coffee or tea                        Jell-O                                      Popsicle                                                Coffee or tea                        Coffee or tea  _____________________________________________________________________   BRUSH YOUR TEETH MORNING OF SURGERY AND RINSE YOUR MOUTH OUT, NO CHEWING GUM CANDY OR MINTS.    Take these medicines the morning of surgery with A SIP OF WATER: pregabalin,hydroxizine. Alprazolam as needed.                               You may not have any metal on your body including hair pins and              piercings  Do not wear jewelry, make-up, lotions, powders or perfumes, deodorant             Do not wear nail polish on your fingernails.  Do not shave  48 hours prior to surgery.    Do not bring valuables to the hospital. [place].  Contacts, dentures or bridgework may not be worn into surgery.  Leave suitcase in the car. After surgery it may be brought to your room.     Patients discharged the day of surgery will not be allowed to drive home. IF YOU ARE HAVING SURGERY AND GOING HOME THE SAME DAY, YOU MUST HAVE AN ADULT TO DRIVE YOU HOME AND BE WITH YOU FOR 24 HOURS. YOU MAY GO HOME BY TAXI OR UBER OR ORTHERWISE, BUT AN ADULT MUST ACCOMPANY YOU HOME AND STAY WITH YOU FOR 24 HOURS.  Name and phone number of your driver:  Special Instructions: N/A              Please read over the following fact sheets you were given: _____________________________________________________________________         [facility] - Preparing for Surgery Before surgery, you can play an important role.  Because skin is not sterile, your skin needs to be as free of germs as possible.  You can reduce the number of germs on your skin by washing with CHG (chlorahexidine gluconate) soap before  surgery.  CHG is an antiseptic cleaner which kills germs and bonds with the skin to continue killing germs even after washing. Please DO NOT use if you have an allergy to CHG or antibacterial soaps.  If your skin becomes reddened/irritated stop using the CHG and inform your nurse when you arrive at Short Stay. Do not shave (including legs and underarms) for at least 48 hours prior to the first CHG shower.  You may shave your face/neck. Please follow these instructions carefully:  1.  Shower with CHG Soap the night before surgery and the  morning of Surgery.  2.  If you choose to wash your hair, wash your hair first as usual with your  normal  shampoo.  3.  After you shampoo, rinse your hair and body thoroughly to remove the  shampoo.                           4.  Use CHG as you would any other liquid soap.  You can apply chg directly  to the skin and wash                       Gently with a scrungie or clean washcloth.  5.  Apply the CHG Soap to your body ONLY FROM THE NECK DOWN.   Do not use on face/ open                           Wound or open sores. Avoid contact with eyes, ears mouth and genitals (private parts).                       Wash face,  Genitals (private parts) with your normal soap.             6.  Wash thoroughly, paying special attention to the area where your surgery  will be performed.  7.  Thoroughly rinse your body with warm water from the neck down.  8.  DO NOT shower/wash with your normal soap after using and rinsing off  the CHG Soap.                9.  Pat yourself dry with a clean towel.            10.  Wear clean pajamas.            11.  Place clean sheets on your bed the night of your first shower and do not  sleep with pets. Day of Surgery : Do not apply any lotions/deodorants the morning of surgery.  Please wear clean clothes to the hospital/surgery center.  FAILURE TO FOLLOW THESE INSTRUCTIONS MAY RESULT IN THE CANCELLATION OF YOUR SURGERY PATIENT  SIGNATURE_________________________________  NURSE SIGNATURE__________________________________  ________________________________________________________________________   Maria [name]  An incentive spirometer is a tool that can help keep your lungs clear and active. This tool measures how well you are filling your lungs with each breath. Taking long deep breaths may help reverse or decrease the chance of developing breathing (pulmonary) problems (especially infection) following:  A long period of time when you are unable to move or be active. BEFORE THE PROCEDURE   If the spirometer includes an indicator to show your best effort, your nurse or respiratory therapist will  set it to a desired goal.  If possible, sit up straight or lean slightly forward. Try not to slouch.  Hold the incentive spirometer in an upright position. INSTRUCTIONS FOR USE  1. Sit on the edge of your bed if possible, or sit up as far as you can in bed or on a chair. 2. Hold the incentive spirometer in an upright position. 3. Breathe out normally. 4. Place the mouthpiece in your mouth and seal your lips tightly around it. 5. Breathe in slowly and as deeply as possible, raising the piston or the ball toward the top of the column. 6. Hold your breath for 3-5 seconds or for as long as possible. Allow the piston or ball to fall to the bottom of the column. 7. Remove the mouthpiece from your mouth and breathe out normally. 8. Rest for a few seconds and repeat Steps 1 through 7 at least 10 times every 1-2 hours when you are awake. Take your time and take a few normal breaths between deep breaths. 9. The spirometer may include an indicator to show your best effort. Use the indicator as a goal to work toward during each repetition. 10. After each set of 10 deep breaths, practice coughing to be sure your lungs are clear. If you have an incision (the cut made at the time of surgery), support your incision when coughing  by placing a pillow or rolled up towels firmly against it. Once you are able to get out of bed, walk around indoors and cough well. You may stop using the incentive spirometer when instructed by your caregiver.  RISKS AND COMPLICATIONS  Take your time so you do not get dizzy or light-headed.  If you are in pain, you may need to take or ask for pain medication before doing incentive spirometry. It is harder to take a deep breath if you are having pain. AFTER USE  Rest and breathe slowly and easily.  It can be helpful to keep track of a log of your progress. Your caregiver can provide you with a simple table to help with this. If you are using the spirometer at home, follow these instructions: [place] IF:   You are having difficultly using the spirometer.  You have trouble using the spirometer as often as instructed.  Your pain medication is not giving enough relief while using the spirometer.  You develop fever of 100.5 F (38.1 C) or higher. SEEK IMMEDIATE MEDICAL CARE IF:   You cough up bloody sputum that had not been present before.  You develop fever of 102 F (38.9 C) or greater.  You develop worsening pain at or near the incision site. MAKE SURE YOU:   Understand these instructions.  Will watch your condition.  Will get help right away if you are not doing well or get worse. Document Released: 10/17/2006 Document Revised: 08/29/2011 Document Reviewed: 12/18/2006 [facility] Patient Information [address], [place].   ________________________________________________________________________

## 2020-10-15 ENCOUNTER — Other Ambulatory Visit: Payer: Self-pay

## 2020-10-15 ENCOUNTER — Encounter (HOSPITAL_COMMUNITY): Payer: Self-pay

## 2020-10-15 ENCOUNTER — Encounter (HOSPITAL_COMMUNITY)
Admission: RE | Admit: 2020-10-15 | Discharge: 2020-10-15 | Disposition: A | Discharge status: Home / Self Care | Payer: Medicaid Other | Source: Ambulatory Visit | Attending: Orthopedic Surgery | Admitting: Orthopedic Surgery

## 2020-10-15 DIAGNOSIS — Z01818 Encounter for other preprocedural examination: Secondary | ICD-10-CM | POA: Diagnosis not present

## 2020-10-15 HISTORY — DX: Essential (primary) hypertension: I10

## 2020-10-15 HISTORY — DX: Anxiety disorder, unspecified: F41.9

## 2020-10-15 HISTORY — DX: Depression, unspecified: F32.A

## 2020-10-15 LAB — COMPREHENSIVE METABOLIC PANEL
ALT: 22 U/L (ref 0–44)
AST: 25 U/L (ref 15–41)
Albumin: 3.6 g/dL (ref 3.5–5.0)
Alkaline Phosphatase: 82 U/L (ref 38–126)
Anion gap: 5 (ref 5–15)
BUN: 11 mg/dL (ref 6–20)
CO2: 26 mmol/L (ref 22–32)
Calcium: 8.8 mg/dL — ABNORMAL LOW (ref 8.9–10.3)
Chloride: 107 mmol/L (ref 98–111)
Creatinine, Ser: 0.6 mg/dL (ref 0.44–1.00)
GFR, Estimated: >60 mL/min (ref >60)
Glucose, Bld: 89 mg/dL (ref 70–99)
Potassium: 4.5 mmol/L (ref 3.5–5.1)
Sodium: 138 mmol/L (ref 135–145)
Total Bilirubin: 0.7 mg/dL (ref 0.3–1.2)
Total Protein: 7.1 g/dL (ref 6.5–8.1)

## 2020-10-15 LAB — URINALYSIS, ROUTINE W REFLEX MICROSCOPIC
Bilirubin Urine: NEGATIVE
Glucose, UA: NEGATIVE mg/dL
Ketones, ur: NEGATIVE mg/dL
Nitrite: NEGATIVE
Protein, ur: NEGATIVE mg/dL
Specific Gravity, Urine: 1.014 (ref 1.005–1.030)
pH: 6 (ref 5.0–8.0)

## 2020-10-15 LAB — CBC WITH DIFFERENTIAL/PLATELET
Abs Immature Granulocytes: 0.01 10*3/uL (ref 0.00–0.07)
Basophils Absolute: 0 10*3/uL (ref 0.0–0.1)
Basophils Relative: 1 %
Eosinophils Absolute: 0.2 10*3/uL (ref 0.0–0.5)
Eosinophils Relative: 3 %
HCT: 36.8 % (ref 36.0–46.0)
Hemoglobin: 12.1 g/dL (ref 12.0–15.0)
Immature Granulocytes: 0 %
Lymphocytes Relative: 44 %
Lymphs Abs: 2.7 10*3/uL (ref 0.7–4.0)
MCH: 29.4 pg (ref 26.0–34.0)
MCHC: 32.9 g/dL (ref 30.0–36.0)
MCV: 89.5 fL (ref 80.0–100.0)
Monocytes Absolute: 0.4 10*3/uL (ref 0.1–1.0)
Monocytes Relative: 6 %
Neutro Abs: 2.9 10*3/uL (ref 1.7–7.7)
Neutrophils Relative %: 46 %
Platelets: 300 10*3/uL (ref 150–400)
RBC: 4.11 MIL/uL (ref 3.87–5.11)
RDW: 13.9 % (ref 11.5–15.5)
WBC: 6.2 10*3/uL (ref 4.0–10.5)
nRBC: 0 % (ref 0.0–0.2)

## 2020-10-15 LAB — APTT: aPTT: 30 seconds (ref 24–36)

## 2020-10-15 LAB — NO BLOOD PRODUCTS

## 2020-10-15 LAB — SURGICAL PCR SCREEN
MRSA, PCR: NEGATIVE
Staphylococcus aureus: NEGATIVE

## 2020-10-15 LAB — PROTIME-INR
INR: 0.9 (ref 0.8–1.2)
Prothrombin Time: 12.4 seconds (ref 11.4–15.2)

## 2020-10-15 NOTE — Progress Notes (Signed)
Pt. Refused blood transfusion.

## 2020-10-15 NOTE — Progress Notes (Signed)
COVID Vaccine Completed: NO Date COVID Vaccine completed: COVID vaccine manufacturer: Pfizer    Moderna   [employer]   PCP - Dr. Nolene Ebbs Cardiologist -   Chest x-ray - 08/12/19 EKG -  Stress Test -  ECHO -  Cardiac Cath -  Pacemaker/ICD device last checked:  Sleep Study -  CPAP -   Fasting Blood Sugar -  Checks Blood Sugar _____ times a day  Blood Thinner Instructions: Aspirin Instructions: Last Dose:  Anesthesia review: Hx: DVT,PE,smoker  Patient denies shortness of breath, fever, cough and chest pain at PAT appointment   Patient verbalized understanding of instructions that were given to them at the PAT appointment. Patient was also instructed that they will need to review over the PAT instructions again at home before surgery.

## 2020-10-16 LAB — URINE CULTURE: Culture: NO GROWTH

## 2020-10-16 NOTE — Progress Notes (Signed)
Final EKG done 10/15/20 in epic.

## 2020-10-21 ENCOUNTER — Other Ambulatory Visit (HOSPITAL_COMMUNITY)
Admission: RE | Admit: 2020-10-21 | Discharge: 2020-10-21 | Disposition: A | Discharge status: Home / Self Care | Payer: Medicaid Other | Source: Ambulatory Visit | Attending: Orthopedic Surgery | Admitting: Orthopedic Surgery

## 2020-10-21 DIAGNOSIS — Z20822 Contact with and (suspected) exposure to covid-19: Secondary | ICD-10-CM | POA: Diagnosis not present

## 2020-10-21 DIAGNOSIS — Z01812 Encounter for preprocedural laboratory examination: Secondary | ICD-10-CM | POA: Diagnosis not present

## 2020-10-21 LAB — SARS CORONAVIRUS 2 (TAT 6-24 HRS): SARS Coronavirus 2: NEGATIVE

## 2020-10-22 MED ORDER — BUPIVACAINE LIPOSOME 1.3 % IJ SUSP
20.0000 mL | Freq: Once | INTRAMUSCULAR | Status: DC
  Filled 2020-10-22: qty 20

## 2020-10-22 MED ORDER — TRANEXAMIC ACID 1000 MG/10ML IV SOLN
2000.0000 mg | INTRAVENOUS | Status: DC
  Filled 2020-10-22: qty 20

## 2020-10-22 NOTE — Anesthesia Preprocedure Evaluation (Addendum)
Anesthesia Evaluation  Patient identified by MRN, date of birth, ID band Patient awake    Reviewed: Allergy & Precautions, NPO status , Patient's Chart, lab work & pertinent test results  History of Anesthesia Complications Negative for: history of anesthetic complications  Airway Mallampati: II  TM Distance: >3 FB Neck ROM: Full    Dental   Pulmonary Current SmokerPatient did not abstain from smoking., PE   Pulmonary exam normal        Cardiovascular hypertension, + DVT  Normal cardiovascular exam     Neuro/Psych  Headaches, PSYCHIATRIC DISORDERS Anxiety Depression    GI/Hepatic negative GI ROS, Neg liver ROS,   Endo/Other  negative endocrine ROS  Renal/GU negative Renal ROS     Musculoskeletal  (+) Arthritis , Osteoarthritis,  Fibromyalgia -  Abdominal   Peds  Hematology negative hematology ROS (+)  Plt 300k    Anesthesia Other Findings Chronic back pain Covid test negative   Reproductive/Obstetrics                            Anesthesia Physical Anesthesia Plan  ASA: II  Anesthesia Plan: Spinal   Post-op Pain Management:  Regional for Post-op pain   Induction:   PONV Risk Score and Plan: 1 and Treatment may vary due to age or medical condition and Propofol infusion  Airway Management Planned: Natural Airway and Simple Face Mask  Additional Equipment: None  Intra-op Plan:   Post-operative Plan:   Informed Consent: I have reviewed the patients History and Physical, chart, labs and discussed the procedure including the risks, benefits and alternatives for the proposed anesthesia with the patient or authorized representative who has indicated his/her understanding and acceptance.       Plan Discussed with: CRNA and Anesthesiologist  Anesthesia Plan Comments: (Labs reviewed, platelets acceptable. Discussed risks and benefits of spinal, including spinal/epidural hematoma,  infection, failed block, and PDPH. Patient expressed understanding and wished to proceed. )       Anesthesia Quick Evaluation

## 2020-10-23 ENCOUNTER — Ambulatory Visit (HOSPITAL_COMMUNITY)
Admission: RE | Admit: 2020-10-23 | Discharge: 2020-10-25 | Disposition: A | Discharge status: Home / Self Care | Payer: Medicaid Other | Source: Other Acute Inpatient Hospital | Attending: Orthopedic Surgery | Admitting: Orthopedic Surgery

## 2020-10-23 ENCOUNTER — Other Ambulatory Visit: Payer: Self-pay

## 2020-10-23 ENCOUNTER — Encounter (HOSPITAL_COMMUNITY)
Admission: RE | Disposition: A | Discharge status: Home / Self Care | Payer: Self-pay | Source: Other Acute Inpatient Hospital | Attending: Orthopedic Surgery

## 2020-10-23 ENCOUNTER — Encounter (HOSPITAL_COMMUNITY): Payer: Self-pay | Admitting: Orthopedic Surgery

## 2020-10-23 ENCOUNTER — Ambulatory Visit (HOSPITAL_COMMUNITY): Payer: Medicaid Other | Admitting: Certified Registered Nurse Anesthetist

## 2020-10-23 DIAGNOSIS — E669 Obesity, unspecified: Secondary | ICD-10-CM | POA: Diagnosis not present

## 2020-10-23 DIAGNOSIS — R262 Difficulty in walking, not elsewhere classified: Secondary | ICD-10-CM | POA: Insufficient documentation

## 2020-10-23 DIAGNOSIS — Z79899 Other long term (current) drug therapy: Secondary | ICD-10-CM | POA: Diagnosis not present

## 2020-10-23 DIAGNOSIS — M1712 Unilateral primary osteoarthritis, left knee: Secondary | ICD-10-CM | POA: Insufficient documentation

## 2020-10-23 DIAGNOSIS — F17210 Nicotine dependence, cigarettes, uncomplicated: Secondary | ICD-10-CM | POA: Diagnosis not present

## 2020-10-23 DIAGNOSIS — Z885 Allergy status to narcotic agent status: Secondary | ICD-10-CM | POA: Insufficient documentation

## 2020-10-23 DIAGNOSIS — M6281 Muscle weakness (generalized): Secondary | ICD-10-CM | POA: Diagnosis not present

## 2020-10-23 DIAGNOSIS — M21062 Valgus deformity, not elsewhere classified, left knee: Secondary | ICD-10-CM | POA: Insufficient documentation

## 2020-10-23 DIAGNOSIS — Z7901 Long term (current) use of anticoagulants: Secondary | ICD-10-CM | POA: Insufficient documentation

## 2020-10-23 DIAGNOSIS — Z96652 Presence of left artificial knee joint: Secondary | ICD-10-CM

## 2020-10-23 HISTORY — PX: TOTAL KNEE ARTHROPLASTY: SHX125

## 2020-10-23 SURGERY — ARTHROPLASTY, KNEE, TOTAL
Anesthesia: Spinal | Site: Knee | Laterality: Left

## 2020-10-23 MED ORDER — GLYCOPYRROLATE PF 0.2 MG/ML IJ SOSY
PREFILLED_SYRINGE | INTRAMUSCULAR | Status: AC
  Filled 2020-10-23: qty 1

## 2020-10-23 MED ORDER — TRANEXAMIC ACID-NACL 1000-0.7 MG/100ML-% IV SOLN
INTRAVENOUS | Status: AC
  Administered 2020-10-23: 1000 mg
  Filled 2020-10-23: qty 100

## 2020-10-23 MED ORDER — FENTANYL CITRATE (PF) 100 MCG/2ML IJ SOLN
INTRAMUSCULAR | Status: DC | PRN
  Administered 2020-10-23 (×2): 50 ug via INTRAVENOUS

## 2020-10-23 MED ORDER — ORAL CARE MOUTH RINSE
15.0000 mL | Freq: Once | OROMUCOSAL | Status: AC
  Administered 2020-10-23: 15 mL via OROMUCOSAL

## 2020-10-23 MED ORDER — APIXABAN 2.5 MG PO TABS: 2.5000 mg | ORAL_TABLET | Freq: Two times a day (BID) | ORAL | 0 refills | Status: DC

## 2020-10-23 MED ORDER — OXYCODONE HCL 5 MG PO TABS
5.0000 mg | ORAL_TABLET | ORAL | Status: DC | PRN
  Administered 2020-10-23 – 2020-10-25 (×9): 10 mg via ORAL
  Filled 2020-10-23 (×9): qty 2

## 2020-10-23 MED ORDER — HYDROMORPHONE HCL 1 MG/ML IJ SOLN
0.5000 mg | INTRAMUSCULAR | Status: DC | PRN
  Administered 2020-10-23 (×2): 1 mg via INTRAVENOUS
  Filled 2020-10-23: qty 1

## 2020-10-23 MED ORDER — FENTANYL CITRATE (PF) 100 MCG/2ML IJ SOLN
INTRAMUSCULAR | Status: AC
  Filled 2020-10-23: qty 2

## 2020-10-23 MED ORDER — TRANEXAMIC ACID-NACL 1000-0.7 MG/100ML-% IV SOLN: 1000.0000 mg | Freq: Once | INTRAVENOUS | Status: AC

## 2020-10-23 MED ORDER — METOCLOPRAMIDE HCL 5 MG PO TABS
5.0000 mg | ORAL_TABLET | Freq: Three times a day (TID) | ORAL | Status: DC | PRN
Start: 2020-10-23 — End: 2020-10-25
  Filled 2020-10-23: qty 2

## 2020-10-23 MED ORDER — POVIDONE-IODINE 10 % EX SWAB
2.0000 "application " | Freq: Once | CUTANEOUS | Status: AC
  Administered 2020-10-23: 2 via TOPICAL

## 2020-10-23 MED ORDER — SODIUM CHLORIDE 0.9 % IR SOLN
Status: DC | PRN
  Administered 2020-10-23: 3000 mL

## 2020-10-23 MED ORDER — APIXABAN 2.5 MG PO TABS
2.5000 mg | ORAL_TABLET | Freq: Two times a day (BID) | ORAL | Status: DC
  Administered 2020-10-23 – 2020-10-25 (×4): 2.5 mg via ORAL
  Filled 2020-10-23 (×4): qty 1

## 2020-10-23 MED ORDER — TIZANIDINE HCL 4 MG PO TABS
4.0000 mg | ORAL_TABLET | Freq: Three times a day (TID) | ORAL | Status: DC
  Administered 2020-10-23 – 2020-10-25 (×7): 4 mg via ORAL
  Filled 2020-10-23 (×7): qty 1

## 2020-10-23 MED ORDER — DEXTROMETHORPHAN POLISTIREX ER 30 MG/5ML PO SUER
30.0000 mg | Freq: Once | ORAL | Status: AC | PRN
  Administered 2020-10-23: 30 mg via ORAL
  Filled 2020-10-23: qty 5

## 2020-10-23 MED ORDER — DEXAMETHASONE SODIUM PHOSPHATE 10 MG/ML IJ SOLN
INTRAMUSCULAR | Status: AC
  Filled 2020-10-23: qty 1

## 2020-10-23 MED ORDER — ONDANSETRON HCL 4 MG/2ML IJ SOLN
INTRAMUSCULAR | Status: AC
  Filled 2020-10-23: qty 2

## 2020-10-23 MED ORDER — PREGABALIN 75 MG PO CAPS
150.0000 mg | ORAL_CAPSULE | Freq: Three times a day (TID) | ORAL | Status: DC
  Administered 2020-10-23 – 2020-10-25 (×7): 150 mg via ORAL
  Filled 2020-10-23 (×7): qty 2

## 2020-10-23 MED ORDER — PROMETHAZINE HCL 25 MG/ML IJ SOLN: 6.2500 mg | INTRAMUSCULAR | Status: DC | PRN

## 2020-10-23 MED ORDER — OXYCODONE HCL 5 MG PO TABS
ORAL_TABLET | ORAL | Status: AC
  Filled 2020-10-23: qty 2

## 2020-10-23 MED ORDER — HYDROXYZINE HCL 25 MG PO TABS
25.0000 mg | ORAL_TABLET | Freq: Two times a day (BID) | ORAL | Status: DC
  Administered 2020-10-23 – 2020-10-24 (×2): 25 mg via ORAL
  Filled 2020-10-23 (×2): qty 1

## 2020-10-23 MED ORDER — DOCUSATE SODIUM 100 MG PO CAPS
100.0000 mg | ORAL_CAPSULE | Freq: Two times a day (BID) | ORAL | Status: DC
  Administered 2020-10-23 – 2020-10-25 (×4): 100 mg via ORAL
  Filled 2020-10-23 (×4): qty 1

## 2020-10-23 MED ORDER — WATER FOR IRRIGATION, STERILE IR SOLN
Status: DC | PRN
  Administered 2020-10-23 (×2): 1000 mL

## 2020-10-23 MED ORDER — BUPIVACAINE LIPOSOME 1.3 % IJ SUSP
INTRAMUSCULAR | Status: DC | PRN
  Administered 2020-10-23: 20 mL

## 2020-10-23 MED ORDER — PROPOFOL 500 MG/50ML IV EMUL
INTRAVENOUS | Status: DC | PRN
  Administered 2020-10-23: 40 ug/kg/min via INTRAVENOUS

## 2020-10-23 MED ORDER — LABETALOL HCL 5 MG/ML IV SOLN
INTRAVENOUS | Status: AC
  Administered 2020-10-23: 5 mg via INTRAVENOUS
  Filled 2020-10-23: qty 4

## 2020-10-23 MED ORDER — LACTATED RINGERS IV BOLUS
250.0000 mL | Freq: Once | INTRAVENOUS | Status: AC
  Administered 2020-10-23: 250 mL via INTRAVENOUS

## 2020-10-23 MED ORDER — OXYCODONE HCL 5 MG PO TABS: ORAL_TABLET | ORAL | 0 refills | Status: DC

## 2020-10-23 MED ORDER — ALPRAZOLAM 1 MG PO TABS
2.0000 mg | ORAL_TABLET | Freq: Two times a day (BID) | ORAL | Status: DC | PRN
  Administered 2020-10-24: 2 mg via ORAL
  Filled 2020-10-23: qty 2

## 2020-10-23 MED ORDER — LACTATED RINGERS IV BOLUS
500.0000 mL | Freq: Once | INTRAVENOUS | Status: AC
  Administered 2020-10-23: 500 mL via INTRAVENOUS

## 2020-10-23 MED ORDER — BUPIVACAINE-EPINEPHRINE (PF) 0.25% -1:200000 IJ SOLN
INTRAMUSCULAR | Status: DC | PRN
  Administered 2020-10-23: 30 mL via PERINEURAL

## 2020-10-23 MED ORDER — ACETAMINOPHEN 325 MG PO TABS: 650.0000 mg | ORAL_TABLET | ORAL | 2 refills | Status: DC | PRN

## 2020-10-23 MED ORDER — DIPHENHYDRAMINE HCL 12.5 MG/5ML PO ELIX
12.5000 mg | ORAL_SOLUTION | ORAL | Status: DC | PRN
Start: 2020-10-23 — End: 2020-10-24
  Filled 2020-10-23: qty 10

## 2020-10-23 MED ORDER — SODIUM CHLORIDE (PF) 0.9 % IJ SOLN
INTRAMUSCULAR | Status: AC
  Filled 2020-10-23: qty 50

## 2020-10-23 MED ORDER — TRANEXAMIC ACID-NACL 1000-0.7 MG/100ML-% IV SOLN
1000.0000 mg | INTRAVENOUS | Status: AC
  Administered 2020-10-23: 1000 mg via INTRAVENOUS
  Filled 2020-10-23: qty 100

## 2020-10-23 MED ORDER — CEFAZOLIN SODIUM-DEXTROSE 2-4 GM/100ML-% IV SOLN
2.0000 g | INTRAVENOUS | Status: AC
  Administered 2020-10-23: 2 g via INTRAVENOUS
  Filled 2020-10-23: qty 100

## 2020-10-23 MED ORDER — ONDANSETRON HCL 4 MG PO TABS
4.0000 mg | ORAL_TABLET | Freq: Four times a day (QID) | ORAL | Status: DC | PRN
  Filled 2020-10-23: qty 1

## 2020-10-23 MED ORDER — MIDAZOLAM HCL 2 MG/2ML IJ SOLN
INTRAMUSCULAR | Status: DC | PRN
  Administered 2020-10-23 (×2): 1 mg via INTRAVENOUS

## 2020-10-23 MED ORDER — FENTANYL CITRATE (PF) 100 MCG/2ML IJ SOLN
25.0000 ug | INTRAMUSCULAR | Status: DC | PRN
  Administered 2020-10-23 (×2): 50 ug via INTRAVENOUS

## 2020-10-23 MED ORDER — PROPOFOL 10 MG/ML IV BOLUS
INTRAVENOUS | Status: DC | PRN
  Administered 2020-10-23 (×4): 20 mg via INTRAVENOUS

## 2020-10-23 MED ORDER — MIDAZOLAM HCL 2 MG/2ML IJ SOLN
INTRAMUSCULAR | Status: AC
  Filled 2020-10-23: qty 2

## 2020-10-23 MED ORDER — DEXAMETHASONE SODIUM PHOSPHATE 10 MG/ML IJ SOLN
INTRAMUSCULAR | Status: DC | PRN
  Administered 2020-10-23: 10 mg via INTRAVENOUS

## 2020-10-23 MED ORDER — ACETAMINOPHEN 500 MG PO TABS
1000.0000 mg | ORAL_TABLET | Freq: Four times a day (QID) | ORAL | Status: AC
  Administered 2020-10-23 – 2020-10-24 (×3): 1000 mg via ORAL
  Filled 2020-10-23 (×3): qty 2

## 2020-10-23 MED ORDER — ACETAMINOPHEN 325 MG PO TABS
325.0000 mg | ORAL_TABLET | Freq: Four times a day (QID) | ORAL | Status: DC | PRN
  Administered 2020-10-24 – 2020-10-25 (×3): 650 mg via ORAL
  Filled 2020-10-23 (×4): qty 2

## 2020-10-23 MED ORDER — PHENOL 1.4 % MT LIQD: 1.0000 | OROMUCOSAL | Status: DC | PRN

## 2020-10-23 MED ORDER — SODIUM CHLORIDE 0.9% FLUSH
INTRAVENOUS | Status: DC | PRN
  Administered 2020-10-23: 50 mL

## 2020-10-23 MED ORDER — MENTHOL 3 MG MT LOZG: 1.0000 | LOZENGE | OROMUCOSAL | Status: DC | PRN

## 2020-10-23 MED ORDER — BUPIVACAINE IN DEXTROSE 0.75-8.25 % IT SOLN
INTRATHECAL | Status: DC | PRN
  Administered 2020-10-23: 1.6 mL via INTRATHECAL

## 2020-10-23 MED ORDER — GLYCOPYRROLATE PF 0.2 MG/ML IJ SOSY
PREFILLED_SYRINGE | INTRAMUSCULAR | Status: DC | PRN
  Administered 2020-10-23 (×2): .1 mg via INTRAVENOUS

## 2020-10-23 MED ORDER — BUPIVACAINE-EPINEPHRINE (PF) 0.25% -1:200000 IJ SOLN
INTRAMUSCULAR | Status: AC
  Filled 2020-10-23: qty 30

## 2020-10-23 MED ORDER — HYDROMORPHONE HCL 1 MG/ML IJ SOLN
INTRAMUSCULAR | Status: AC
  Filled 2020-10-23: qty 1

## 2020-10-23 MED ORDER — PHENYLEPHRINE HCL-NACL 10-0.9 MG/250ML-% IV SOLN
INTRAVENOUS | Status: DC | PRN
  Administered 2020-10-23: 20 ug/min via INTRAVENOUS

## 2020-10-23 MED ORDER — SODIUM CHLORIDE 0.9 % IV SOLN: INTRAVENOUS | Status: DC

## 2020-10-23 MED ORDER — LABETALOL HCL 5 MG/ML IV SOLN
5.0000 mg | INTRAVENOUS | Status: AC | PRN
  Administered 2020-10-23 (×3): 5 mg via INTRAVENOUS

## 2020-10-23 MED ORDER — CHLORHEXIDINE GLUCONATE 0.12 % MT SOLN: 15.0000 mL | Freq: Once | OROMUCOSAL | Status: AC

## 2020-10-23 MED ORDER — ROPIVACAINE HCL 7.5 MG/ML IJ SOLN
INTRAMUSCULAR | Status: DC | PRN
  Administered 2020-10-23: 20 mL via PERINEURAL

## 2020-10-23 MED ORDER — ONDANSETRON HCL 4 MG/2ML IJ SOLN: 4.0000 mg | Freq: Four times a day (QID) | INTRAMUSCULAR | Status: DC | PRN

## 2020-10-23 MED ORDER — METOCLOPRAMIDE HCL 5 MG/ML IJ SOLN: 5.0000 mg | Freq: Three times a day (TID) | INTRAMUSCULAR | Status: DC | PRN

## 2020-10-23 MED ORDER — ONDANSETRON HCL 4 MG/2ML IJ SOLN
INTRAMUSCULAR | Status: DC | PRN
  Administered 2020-10-23: 4 mg via INTRAVENOUS

## 2020-10-23 MED ORDER — LACTATED RINGERS IV SOLN: INTRAVENOUS | Status: DC

## 2020-10-23 SURGICAL SUPPLY — 57 items
ATTUNE PS FEM LT SZ 4 CEM KNEE (Femur) ×2 IMPLANT
ATTUNE PSRP INSR SZ4 6 KNEE (Insert) ×2 IMPLANT
BAG DECANTER FOR FLEXI CONT (MISCELLANEOUS) ×2 IMPLANT
BAG ZIPLOCK 12X15 (MISCELLANEOUS) ×2 IMPLANT
BASE TIBIAL ROT PLAT SZ 5 KNEE (Knees) ×1 IMPLANT
BLADE SAGITTAL 25.0X1.19X90 (BLADE) ×2 IMPLANT
BLADE SAW SGTL 13X75X1.27 (BLADE) ×2 IMPLANT
BLADE SURG 15 STRL LF DISP TIS (BLADE) ×1 IMPLANT
BLADE SURG 15 STRL SS (BLADE) ×1
BLADE SURG SZ10 CARB STEEL (BLADE) ×4 IMPLANT
BNDG ELASTIC 6X15 VLCR STRL LF (GAUZE/BANDAGES/DRESSINGS) ×2 IMPLANT
BOWL SMART MIX CTS (DISPOSABLE) ×2 IMPLANT
CEMENT HV SMART SET (Cement) ×4 IMPLANT
CLSR STERI-STRIP ANTIMIC 1/2X4 (GAUZE/BANDAGES/DRESSINGS) ×4 IMPLANT
COVER SURGICAL LIGHT HANDLE (MISCELLANEOUS) ×2 IMPLANT
COVER WAND RF STERILE (DRAPES) ×2 IMPLANT
CUFF TOURN SGL QUICK 34 (TOURNIQUET CUFF) ×1
CUFF TRNQT CYL 34X4.125X (TOURNIQUET CUFF) ×1 IMPLANT
DECANTER SPIKE VIAL GLASS SM (MISCELLANEOUS) ×4 IMPLANT
DRAPE ORTHO SPLIT 77X108 STRL (DRAPES) ×2
DRAPE SURG ORHT 6 SPLT 77X108 (DRAPES) ×2 IMPLANT
DRAPE U-SHAPE 47X51 STRL (DRAPES) ×2 IMPLANT
DRESSING AQUACEL AG SP 3.5X10 (GAUZE/BANDAGES/DRESSINGS) ×1 IMPLANT
DRSG AQUACEL AG SP 3.5X10 (GAUZE/BANDAGES/DRESSINGS) ×2
DURAPREP 26ML APPLICATOR (WOUND CARE) ×4 IMPLANT
ELECT REM PT RETURN 15FT ADLT (MISCELLANEOUS) ×2 IMPLANT
GLOVE SRG 8 PF TXTR STRL LF DI (GLOVE) ×2 IMPLANT
GLOVE SURG ORTHO LTX SZ8 (GLOVE) ×2 IMPLANT
GLOVE SURG POLYISO LF SZ7.5 (GLOVE) ×2 IMPLANT
GLOVE SURG UNDER POLY LF SZ8 (GLOVE) ×2
GOWN STRL REUS W/TWL XL LVL3 (GOWN DISPOSABLE) ×8 IMPLANT
HANDPIECE INTERPULSE COAX TIP (DISPOSABLE) ×1
HOLDER FOLEY CATH W/STRAP (MISCELLANEOUS) IMPLANT
HOOD PEEL AWAY FLYTE STAYCOOL (MISCELLANEOUS) ×2 IMPLANT
IMMOBILIZER KNEE 20 (SOFTGOODS) ×2
IMMOBILIZER KNEE 20 THIGH 36 (SOFTGOODS) ×1 IMPLANT
KIT TURNOVER KIT A (KITS) ×2 IMPLANT
MANIFOLD NEPTUNE II (INSTRUMENTS) ×2 IMPLANT
NEEDLE HYPO 22GX1.5 SAFETY (NEEDLE) ×4 IMPLANT
NS IRRIG 1000ML POUR BTL (IV SOLUTION) ×2 IMPLANT
PACK ICE MAXI GEL EZY WRAP (MISCELLANEOUS) ×2 IMPLANT
PACK TOTAL KNEE CUSTOM (KITS) ×2 IMPLANT
PATELLA MEDIAL ATTUN 35MM KNEE (Knees) ×2 IMPLANT
PIN DRILL FIX HALF THREAD (BIT) ×2 IMPLANT
PIN STEINMAN FIXATION KNEE (PIN) ×2 IMPLANT
PROTECTOR NERVE ULNAR (MISCELLANEOUS) ×2 IMPLANT
SET HNDPC FAN SPRY TIP SCT (DISPOSABLE) ×1 IMPLANT
SUT ETHIBOND NAB CT1 #1 30IN (SUTURE) ×6 IMPLANT
SUT MNCRL AB 3-0 PS2 18 (SUTURE) ×2 IMPLANT
SUT VIC AB 0 CT1 36 (SUTURE) ×2 IMPLANT
SUT VIC AB 2-0 CT1 27 (SUTURE) ×2
SUT VIC AB 2-0 CT1 TAPERPNT 27 (SUTURE) ×2 IMPLANT
SYR CONTROL 10ML LL (SYRINGE) ×6 IMPLANT
TIBIAL BASE ROT PLAT SZ 5 KNEE (Knees) ×2 IMPLANT
TOWEL OR 17X26 10 PK STRL BLUE (TOWEL DISPOSABLE) ×2 IMPLANT
TRAY FOLEY MTR SLVR 16FR STAT (SET/KITS/TRAYS/PACK) ×2 IMPLANT
WATER STERILE IRR 1000ML POUR (IV SOLUTION) ×4 IMPLANT

## 2020-10-23 NOTE — Progress Notes (Signed)
PT Cancellation Note  Patient Details Name: Maria [name] MRN: [id number] DOB: July 12, 1965   Cancelled Treatment:    Reason Eval/Treat Not Completed: Other (comment);Medical issues which prohibited therapy. Patient BP elevated with last two readings 198/93 mmHg and 179/110 mmHg. Pt also with significant pain and reports she does not have a family member to stay with her tonight. Patient requesting to be admitted for pain and BP control and to get proper assistance/care set up. Acute PT will follow up at later time/date as schedule allows and pt medically ready.   Verner Mould, DPT Acute Rehabilitation Services Office [phone] Pager [phone]

## 2020-10-23 NOTE — Addendum Note (Signed)
Addendum  created 10/23/20 1445 by Annye Asa, MD   Order list changed, Pharmacy for encounter modified

## 2020-10-23 NOTE — Transfer of Care (Signed)
Immediate Anesthesia Transfer of Care Note  Patient: Maria [name]  Procedure(s) Performed: TOTAL KNEE ARTHROPLASTY (Left Knee)  Patient Location: PACU  Anesthesia Type:MAC combined with regional for post-op pain  Level of Consciousness: awake, alert , oriented and patient cooperative  Airway & Oxygen Therapy: Patient Spontanous Breathing and Patient connected to nasal cannula oxygen  Post-op Assessment: Report given to RN and Post -op Vital signs reviewed and stable  Post vital signs: Reviewed and stable  Last Vitals:  Vitals Value Taken Time  BP 141/79 10/23/20 0952  Temp    Pulse 68 10/23/20 0955  Resp 22 10/23/20 0955  SpO2 100 % 10/23/20 0955  Vitals shown include unvalidated device data.  Last Pain:  Vitals:   10/23/20 0540  TempSrc: Oral         Complications: No complications documented.

## 2020-10-23 NOTE — Progress Notes (Signed)
Orthopedic Tech Progress Note Patient Details:  Maria [name] 25-Jun-1965 [id number]  CPM Left Knee CPM Left Knee: On Left Knee Flexion (Degrees): 90 Left Knee Extension (Degrees): 0 Additional Comments: foot roll  Post Interventions Patient Tolerated: Well Instructions Provided: Care of device  [place] Pink 10/23/2020, 10:52 AM

## 2020-10-23 NOTE — Brief Op Note (Signed)
10/23/2020  9:52 AM  PATIENT:  Maria [name]  55 y.o. female  PRE-OPERATIVE DIAGNOSIS:  OA LEFT KNEE  POST-OPERATIVE DIAGNOSIS:  OA LEFT KNEE  PROCEDURE:  Procedure(s): TOTAL KNEE ARTHROPLASTY (Left)  SURGEON:  Surgeon(s) and Role:    Earlie Server, MD - Primary  PHYSICIAN ASSISTANT: Chriss Czar, PA-C  ASSISTANTS: OR staff x1   ANESTHESIA:   local, regional, spinal and IV sedation  EBL:  15 mL   BLOOD ADMINISTERED:none  DRAINS: none   LOCAL MEDICATIONS USED:  MARCAINE     SPECIMEN:  No Specimen  DISPOSITION OF SPECIMEN:  N/A  COUNTS:  YES  TOURNIQUET:   Total Tourniquet Time Documented: Thigh (Left) - 59 minutes Total: Thigh (Left) - 59 minutes   DICTATION: .Other Dictation: Dictation Number unknown  PLAN OF CARE: Discharge to home after PACU  PATIENT DISPOSITION:  PACU - hemodynamically stable.   Delay start of Pharmacological VTE agent (>24hrs) due to surgical blood loss or risk of bleeding: no

## 2020-10-23 NOTE — Interval H&P Note (Signed)
History and Physical Interval Note:  10/23/2020 7:33 AM  Maria [name]  has presented today for surgery, with the diagnosis of OA LEFT KNEE.  The various methods of treatment have been discussed with the patient and family. After consideration of risks, benefits and other options for treatment, the patient has consented to  Procedure(s): TOTAL KNEE ARTHROPLASTY (Left) as a surgical intervention.  The patient's history has been reviewed, patient examined, no change in status, stable for surgery.  I have reviewed the patient's chart and labs.  Questions were answered to the patient's satisfaction.     Yvette Rack

## 2020-10-23 NOTE — Evaluation (Signed)
Physical Therapy Evaluation Patient Details Name: Maria [name] MRN: [id number] DOB: January 30, 1966 Today's Date: 10/23/2020   History of Present Illness  Patient is 55 y.o. female s/p Lt TKA on 10/23/20 with PMH significant for PE, DVT, HTN, fibromyalgia, OA, depression, anxiety, migraines.    Clinical Impression  Maria [name] is a 55 y.o. female POD 0 s/p Lt TKA. Patient reports independence with mobility at baseline. Patient is now limited by functional impairments (see PT problem list below) and requires min assist for transfers and gait with RW. Patient eager to use [facility] and mobilize despite significant pain. She was able to perform pericare with min guard assist and assist to support Lt LE as it was painful. Patient then able to ambulate ~6 feet with RW and min assist to move from [facility] to recliner, further gait deferred secondary to pain. Patient instructed in exercise to facilitate circulation to manage edema and reduce risk of DVT. Patient will benefit from continued skilled PT interventions to address impairments and progress towards PLOF. Acute PT will follow to progress mobility and stair training in preparation for safe discharge home.     Follow Up Recommendations Follow surgeon's recommendation for DC plan and follow-up therapies;Home health PT    Equipment Recommendations  3in1 (PT)    Recommendations for Other Services       Precautions / Restrictions Precautions Precautions: Fall Restrictions Weight Bearing Restrictions: No Other Position/Activity Restrictions: WBAT      Mobility  Bed Mobility Overal bed mobility: Needs Assistance Bed Mobility: Supine to Sit     Supine to sit: HOB elevated;Min assist     General bed mobility comments: Cues for use of bed rail and assist to bring Lt LE off EOB due to weight of knee immobilizer and pain.    Transfers Overall transfer level: Needs assistance Equipment used: Rolling walker (2 wheeled) Transfers: Sit to/from [employer] Sit to Stand: Min assist Stand pivot transfers: Min assist       General transfer comment: VC's for hand placement for power up and to sequence step pattern for stand pivot bed>BSC. Min assist for walker positioning. Pt with slight LOB when reaching back to sit in recliner at EOS and min assist needed to prevent.  Ambulation/Gait Ambulation/Gait assistance: Min assist Gait Distance (Feet): 6 Feet Assistive device: Rolling walker (2 wheeled) Gait Pattern/deviations: Step-to pattern;Decreased stride length;Decreased weight shift to left;Decreased stance time - right Gait velocity: decr   General Gait Details: Min assist to steady as pt took several small steps to move BSC>chair. Pt prefering TDWB on Lt LE due to knee pain. Distance limited due to pain.  Stairs            Wheelchair Mobility    Modified Rankin (Stroke Patients Only)       Balance Overall balance assessment: Needs assistance Sitting-balance support: Feet supported Sitting balance-Leahy Scale: Good     Standing balance support: During functional activity;Bilateral upper extremity supported Standing balance-Leahy Scale: Poor                               Pertinent Vitals/Pain Pain Assessment: 0-10 Pain Score: 10-Worst pain ever Pain Location: Lt knee Pain Descriptors / Indicators: Grimacing;Guarding;Sore;Throbbing Pain Intervention(s): Limited activity within patient's tolerance;Monitored during session;Repositioned;Ice applied;Premedicated before session    Home Living Family/patient expects to be discharged to:: Private residence Living Arrangements: Children Available Help at Discharge: Family;Available PRN/intermittently Type of Home: [place]  Access: Stairs to enter Entrance Stairs-Rails: Psychiatric nurse of Steps: 2+6 (down 2 up 6) Home Layout: One level Home Equipment: Walker - 2 wheels;Cane - single point      Prior Function Level of  Independence: Independent               Hand Dominance   Dominant Hand: Right    Extremity/Trunk Assessment   Upper Extremity Assessment Upper Extremity Assessment: Overall WFL for tasks assessed    Lower Extremity Assessment Lower Extremity Assessment: LLE deficits/detail LLE Deficits / Details: pt with limited quad activaiton secondary to pain, 4/5 for ankle dorsi/plantar flexion LLE: Unable to fully assess due to pain;Unable to fully assess due to immobilization LLE Sensation: WNL LLE Coordination: WNL    Cervical / Trunk Assessment Cervical / Trunk Assessment: Normal  Communication   Communication: No difficulties  Cognition Arousal/Alertness: Awake/alert Behavior During Therapy: WFL for tasks assessed/performed Overall Cognitive Status: Within Functional Limits for tasks assessed                                        General Comments      Exercises Total Joint Exercises Ankle Circles/Pumps: AROM;20 reps;Seated;Both Other Exercises Other Exercises: 10 reps IS, cues for proper use.   Assessment/Plan    PT Assessment Patient needs continued PT services  PT Problem List Decreased strength;Decreased range of motion;Decreased activity tolerance;Decreased balance;Decreased mobility;Decreased knowledge of use of DME;Decreased knowledge of precautions;Pain       PT Treatment Interventions DME instruction;Gait training;Stair training;Functional mobility training;Therapeutic activities;Therapeutic exercise;Balance training;Patient/family education    PT Goals (Current goals can be found in the Care Plan section)  Acute Rehab PT Goals Patient Stated Goal: regain independence PT Goal Formulation: With patient Time For Goal Achievement: 10/30/20 Potential to Achieve Goals: Good    Frequency 7X/week   Barriers to discharge        Co-evaluation               AM-PAC PT "6 Clicks" Mobility  Outcome Measure Help needed turning from your  back to your side while in a flat bed without using bedrails?: A Little Help needed moving from lying on your back to sitting on the side of a flat bed without using bedrails?: A Little Help needed moving to and from a bed to a chair (including a wheelchair)?: A Little Help needed standing up from a chair using your arms (e.g., wheelchair or bedside chair)?: A Little Help needed to walk in hospital room?: A Lot Help needed climbing 3-5 steps with a railing? : A Lot 6 Click Score: 16    End of Session Equipment Utilized During Treatment: Gait belt;Left knee immobilizer Activity Tolerance: Patient limited by pain Patient left: in chair;with call bell/phone within reach;with chair alarm set;with nursing/sitter in room Nurse Communication: Mobility status PT Visit Diagnosis: Muscle weakness (generalized) (M62.81);Difficulty in walking, not elsewhere classified (R26.2)    Time: 1937-9024 PT Time Calculation (min) (ACUTE ONLY): 23 min   Charges:   PT Evaluation $PT Eval Low Complexity: 1 Low PT Treatments $Therapeutic Activity: 8-22 mins        Verner Mould, DPT Acute Rehabilitation Services Office [phone] Pager [phone]    Jacques Navy 10/23/2020, 6:45 PM

## 2020-10-23 NOTE — Anesthesia Procedure Notes (Signed)
Anesthesia Regional Block: Adductor canal block   Pre-Anesthetic Checklist: ,, timeout performed, Correct Patient, Correct Site, Correct Laterality, Correct Procedure, Correct Position, site marked, Risks and benefits discussed,  Surgical consent,  Pre-op evaluation,  At surgeon's request and post-op pain management  Laterality: Left  Prep: chloraprep       Needles:  Injection technique: Single-shot  Needle Type: Echogenic Needle     Needle Length: 10cm  Needle Gauge: 21     Additional Needles:   Narrative:  Start time: 10/23/2020 7:05 AM End time: 10/23/2020 7:08 AM Injection made incrementally with aspirations every 5 mL.  Performed by: Personally  Anesthesiologist: Audry Pili, MD  Additional Notes: No pain on injection. No increased resistance to injection. Injection made in 5cc increments. Good needle visualization. Patient tolerated the procedure well.

## 2020-10-23 NOTE — Anesthesia Procedure Notes (Signed)
Spinal  Patient location during procedure: OR Start time: 10/23/2020 7:44 AM End time: 10/23/2020 7:47 AM Reason for block: surgical anesthesia Staffing Performed: anesthesiologist  Anesthesiologist: Audry Pili, MD Preanesthetic Checklist Completed: patient identified, IV checked, risks and benefits discussed, surgical consent, monitors and equipment checked, pre-op evaluation and timeout performed Spinal Block Patient position: sitting Prep: DuraPrep Patient monitoring: heart rate, cardiac monitor, continuous pulse ox and blood pressure Approach: midline Location: L3-4 Injection technique: single-shot Needle Needle type: Pencan  Needle gauge: 24 G Additional Notes Consent was obtained prior to the procedure with all questions answered and concerns addressed. Risks including, but not limited to, bleeding, infection, nerve damage, paralysis, failed block, inadequate analgesia, allergic reaction, high spinal, itching, and headache were discussed and the patient wished to proceed. Functioning IV was confirmed and monitors were applied. Sterile prep and drape, including hand hygiene, mask, and sterile gloves were used. The patient was positioned and the spine was prepped. The skin was anesthetized with lidocaine. Free flow of clear CSF was obtained prior to injecting local anesthetic into the CSF. The spinal needle aspirated freely following injection. The needle was carefully withdrawn. The patient tolerated the procedure well.   Renold Don, MD

## 2020-10-23 NOTE — Discharge Instructions (Addendum)
INSTRUCTIONS AFTER JOINT REPLACEMENT   o Remove items at home which could result in a fall. This includes throw rugs or furniture in walking pathways o ICE to the affected joint every three hours while awake for 30 minutes at a time, for at least the first 3-5 days, and then as needed for pain and swelling.  Continue to use ice for pain and swelling. You may notice swelling that will progress down to the foot and ankle.  This is normal after surgery.  Elevate your leg when you are not up walking on it.   o Continue to use the breathing machine you got in the hospital (incentive spirometer) which will help keep your temperature down.  It is common for your temperature to cycle up and down following surgery, especially at night when you are not up moving around and exerting yourself.  The breathing machine keeps your lungs expanded and your temperature down.   DIET:  As you were doing prior to hospitalization, we recommend a well-balanced diet.  DRESSING / WOUND CARE / SHOWERING  Keep the surgical dressing until follow up.  The dressing is water proof, so you can shower without any extra covering.  IF THE DRESSING FALLS OFF or the wound gets wet inside, change the dressing with sterile gauze.  Please use good hand washing techniques before changing the dressing.  Do not use any lotions or creams on the incision until instructed by your surgeon.    ACTIVITY  o Increase activity slowly as tolerated, but follow the weight bearing instructions below.   o No driving for 6 weeks or until further direction given by your physician.  You cannot drive while taking narcotics.  o No lifting or carrying greater than 10 lbs. until further directed by your surgeon. o Avoid periods of inactivity such as sitting longer than an hour when not asleep. This helps prevent blood clots.  o You may return to work once you are authorized by your doctor.     WEIGHT BEARING   Weight bearing as tolerated with assist  device (walker, cane, etc) as directed, use it as long as suggested by your surgeon or therapist, typically at least 4-6 weeks.   EXERCISES  Results after joint replacement surgery are often greatly improved when you follow the exercise, range of motion and muscle strengthening exercises prescribed by your doctor. Safety measures are also important to protect the joint from further injury. Any time any of these exercises cause you to have increased pain or swelling, decrease what you are doing until you are comfortable again and then slowly increase them. If you have problems or questions, call your caregiver or physical therapist for advice.   Rehabilitation is important following a joint replacement. After just a few days of immobilization, the muscles of the leg can become weakened and shrink (atrophy).  These exercises are designed to build up the tone and strength of the thigh and leg muscles and to improve motion. Often times heat used for twenty to thirty minutes before working out will loosen up your tissues and help with improving the range of motion but do not use heat for the first two weeks following surgery (sometimes heat can increase post-operative swelling).   These exercises can be done on a training (exercise) mat, on the floor, on a table or on a bed. Use whatever works the best and is most comfortable for you.    Use music or television while you are exercising so that   the exercises are a pleasant break in your day. This will make your life better with the exercises acting as a break in your routine that you can look forward to.   Perform all exercises about fifteen times, three times per day or as directed.  You should exercise both the operative leg and the other leg as well.  Exercises include:   . Quad Sets - Tighten up the muscle on the front of the thigh (Quad) and hold for 5-10 seconds.   . Straight Leg Raises - With your knee straight (if you were given a brace, keep it on),  lift the leg to 60 degrees, hold for 3 seconds, and slowly lower the leg.  Perform this exercise against resistance later as your leg gets stronger.  . Leg Slides: Lying on your back, slowly slide your foot toward your buttocks, bending your knee up off the floor (only go as far as is comfortable). Then slowly slide your foot back down until your leg is flat on the floor again.  . Angel Wings: Lying on your back spread your legs to the side as far apart as you can without causing discomfort.  . Hamstring Strength:  Lying on your back, push your heel against the floor with your leg straight by tightening up the muscles of your buttocks.  Repeat, but this time bend your knee to a comfortable angle, and push your heel against the floor.  You may put a pillow under the heel to make it more comfortable if necessary.   A rehabilitation program following joint replacement surgery can speed recovery and prevent re-injury in the future due to weakened muscles. Contact your doctor or a physical therapist for more information on knee rehabilitation.    CONSTIPATION  Constipation is defined medically as fewer than three stools per week and severe constipation as less than one stool per week.  Even if you have a regular bowel pattern at home, your normal regimen is likely to be disrupted due to multiple reasons following surgery.  Combination of anesthesia, postoperative narcotics, change in appetite and fluid intake all can affect your bowels.   YOU MUST use at least one of the following options; they are listed in order of increasing strength to get the job done.  They are all available over the counter, and you may need to use some, POSSIBLY even all of these options:    Drink plenty of fluids (prune juice may be helpful) and high fiber foods Colace 100 mg by mouth twice a day  Senokot for constipation as directed and as needed Dulcolax (bisacodyl), take with full glass of water  Miralax (polyethylene glycol)  once or twice a day as needed.  If you have tried all these things and are unable to have a bowel movement in the first 3-4 days after surgery call either your surgeon or your primary doctor.    If you experience loose stools or diarrhea, hold the medications until you stool forms back up.  If your symptoms do not get better within 1 week or if they get worse, check with your doctor.  If you experience "the worst abdominal pain ever" or develop nausea or vomiting, please contact the office immediately for further recommendations for treatment.   ITCHING:  If you experience itching with your medications, try taking only a single pain pill, or even half a pain pill at a time.  You can also use Benadryl over the counter for itching or also to   help with sleep.   TED HOSE STOCKINGS:  Use stockings on both legs until for at least 2 weeks or as directed by physician office. They may be removed at night for sleeping.  MEDICATIONS:  See your medication summary on the "After Visit Summary" that nursing will review with you.  You may have some home medications which will be placed on hold until you complete the course of blood thinner medication.  It is important for you to complete the blood thinner medication as prescribed.  PRECAUTIONS:  If you experience chest pain or shortness of breath - call 911 immediately for transfer to the hospital emergency department.   If you develop a fever greater that 101 F, purulent drainage from wound, increased redness or drainage from wound, foul odor from the wound/dressing, or calf pain - CONTACT YOUR SURGEON.                                                   FOLLOW-UP APPOINTMENTS:  If you do not already have a post-op appointment, please call the office for an appointment to be seen by your surgeon.  Guidelines for how soon to be seen are listed in your "After Visit Summary", but are typically between 1-4 weeks after surgery.  OTHER INSTRUCTIONS:   Knee  Replacement:  Do not place pillow under knee, focus on keeping the knee straight while resting. CPM instructions: 0-90 degrees, 2 hours in the morning, 2 hours in the afternoon, and 2 hours in the evening. Place foam block, curve side up under heel at all times except when in CPM or when walking.  DO NOT modify, tear, cut, or change the foam block in any way.  POST-OPERATIVE OPIOID TAPER INSTRUCTIONS: . It is important to wean off of your opioid medication as soon as possible. If you do not need pain medication after your surgery it is ok to stop day one. . Opioids include: o Codeine, Hydrocodone(Norco, Vicodin), Oxycodone(Percocet, oxycontin) and hydromorphone amongst others.  . Long term and even short term use of opiods can cause: o Increased pain response o Dependence o Constipation o Depression o Respiratory depression o And more.  . Withdrawal symptoms can include o Flu like symptoms o Nausea, vomiting o And more . Techniques to manage these symptoms o Hydrate well o Eat regular healthy meals o Stay active o Use relaxation techniques(deep breathing, meditating, yoga) . Do Not substitute Alcohol to help with tapering . If you have been on opioids for less than two weeks and do not have pain than it is ok to stop all together.  . Plan to wean off of opioids o This plan should start within one week post op of your joint replacement. o Maintain the same interval or time between taking each dose and first decrease the dose.  o Cut the total daily intake of opioids by one tablet each day o Next start to increase the time between doses. o The last dose that should be eliminated is the evening dose.     MAKE SURE YOU:  . Understand these instructions.  . Get help right away if you are not doing well or get worse.    Thank you for letting us be a part of your medical care team.  It is a privilege we respect greatly.  We hope these instructions will   help you stay on track for a fast  and full recovery!    _____________________________________________________________________________________________________________________________ Information on my medicine - ELIQUIS (apixaban)   Why was Eliquis prescribed for you? Eliquis was prescribed for you to reduce the risk of blood clots forming after orthopedic surgery.    What do You need to know about Eliquis? Take your Eliquis TWICE DAILY - one tablet in the morning and one tablet in the evening with or without food.  It would be best to take the dose about the same time each day.  If you have difficulty swallowing the tablet whole please discuss with your pharmacist how to take the medication safely.  Take Eliquis exactly as prescribed by your doctor and DO NOT stop taking Eliquis without talking to the doctor who prescribed the medication.  Stopping without other medication to take the place of Eliquis may increase your risk of developing a clot.  After discharge, you should have regular check-up appointments with your healthcare provider that is prescribing your Eliquis.  What do you do if you miss a dose? If a dose of ELIQUIS is not taken at the scheduled time, take it as soon as possible on the same day and twice-daily administration should be resumed.  The dose should not be doubled to make up for a missed dose.  Do not take more than one tablet of ELIQUIS at the same time.  Important Safety Information A possible side effect of Eliquis is bleeding. You should call your healthcare provider right away if you experience any of the following: ? Bleeding from an injury or your nose that does not stop. ? Unusual colored urine (red or dark brown) or unusual colored stools (red or black). ? Unusual bruising for unknown reasons. ? A serious fall or if you hit your head (even if there is no bleeding).  Some medicines may interact with Eliquis and might increase your risk of bleeding or clotting while on Eliquis.  To help avoid this, consult your healthcare provider or pharmacist prior to using any new prescription or non-prescription medications, including herbals, vitamins, non-steroidal anti-inflammatory drugs (NSAIDs) and supplements.  This website has more information on Eliquis (apixaban): http://www.eliquis.com/eliquis/home

## 2020-10-23 NOTE — Anesthesia Postprocedure Evaluation (Signed)
Anesthesia Post Note  Patient: Maria [name]  Procedure(s) Performed: TOTAL KNEE ARTHROPLASTY (Left Knee)     Patient location during evaluation: PACU Anesthesia Type: Spinal Level of consciousness: awake and alert Pain management: pain level controlled Vital Signs Assessment: post-procedure vital signs reviewed and stable Respiratory status: spontaneous breathing and respiratory function stable Cardiovascular status: blood pressure returned to baseline and stable Postop Assessment: spinal receding and no apparent nausea or vomiting Anesthetic complications: no   No complications documented.  Last Vitals:  Vitals:   10/23/20 1130 10/23/20 1145  BP: (!) 165/95 (!) 157/133  Pulse: (!) 53 61  Resp: 17 16  Temp:  36.7 C  SpO2: 100% 99%    Last Pain:  Vitals:   10/23/20 1145  TempSrc:   PainSc: [place]

## 2020-10-24 DIAGNOSIS — M1712 Unilateral primary osteoarthritis, left knee: Secondary | ICD-10-CM | POA: Diagnosis not present

## 2020-10-24 NOTE — Op Note (Signed)
Maria [name], GOLDA MEDICAL RECORD NO: [id number] ACCOUNT NO: [id number] DATE OF BIRTH: 18-Mar-1966 FACILITY: [facility] LOCATION: [id number] PHYSICIAN: W D. Valeta Harms., MD  Operative Report   DATE OF PROCEDURE: 10/23/2020  PREOPERATIVE DIAGNOSIS:  Severe osteoarthritis, left knee valgus deformity.  POSTOPERATIVE DIAGNOSIS:  Severe osteoarthritis, left knee valgus deformity.  PROCEDURE:  Left total knee replacement:  (DePuy Attune cemented total knee) size 4 femur 6 mm, size 4 tibial bearing, size 5 tibia with 35 mm all poly patella.  SURGEON:  Vangie Bicker, MD  ASSISTANTMarjo Bicker.  ANESTHESIA:  Spinal block.  TOURNIQUET TIME:  57 minutes.  DESCRIPTION OF PROCEDURE:  Straight skin incision after exsanguination of the leg.  Inflation of thigh tourniquet to 350.  Medial parapatellar approach to the knee was made.  We cut a 10 mm 5-degree valgus cut on the femur followed cutting 3 and  provisional cut with a second cut to obtain full extension about 3-5 mm below the most diseased lateral compartment.  The extension gap was measured at 5 and eventually 6 mm.  The femur was sized to be a size 4 femur, placement of all-in-1 cutting block  in the appropriate degree of external rotation, followed by cutting the anterior and posterior chamfer cuts.  Keel hole was cut for the tibia.  Tibial baseplate trial was placed.  Box cut on the femur.  Trials were placed.  Resolution of the valgus  deformity full extension was noted.  Patella was cut, resecting 9.5 mm of native patella, placed all poly trial.  All parameters deemed to be acceptable.  Trialed off the 5 and the 6 mm.  Cement was prepared on the back table.  Cement was inserted.  The  tibial prosthesis followed by femur, patella with the trial bearing in place.  Cement was allowed to harden.  We then trialed off the two thicknesses, settled on the 6 mm thick bearing.  Pulsatile lavage was used to irrigate as well as mixture of  Marcaine and Exparel  with epinephrine into the capsular and subcutaneous tissues.  Tourniquet was released under direct vision after the trial was removed on the tibial insert.  No excessive bleeding was noted.  Small bleeders were coagulated.  Final  bearing was placed and the 6 mm thick bearing.  Closure was affected with #1 Ethibond, 2-0 Vicryl, and Monocryl.  Taken to recovery room in stable condition.         PAA D: 10/23/2020 9:22:50 am T: 10/24/2020 2:27:00 am  JOB: [id number]/ [id number]

## 2020-10-24 NOTE — Progress Notes (Signed)
Subjective: 1 Day Post-Op Procedure(s) (LRB): TOTAL KNEE ARTHROPLASTY (Left) Patient reports pain as moderate.  PT trouble working with her this AM very groggy.  Objective: Vital signs in last 24 hours: Temp:  [98 F (36.7 C)-99.5 F (37.5 C)] 99.5 F (37.5 C) (05/07 0942) Pulse Rate:  [48-64] 64 (05/07 1241) Resp:  [17-20] 20 (05/07 0942) BP: (114-198)/(72-144) 138/78 (05/07 1241) SpO2:  [94 %-100 %] 98 % (05/07 0942)  Intake/Output from previous day: 05/06 0701 - 05/07 0700 In: 3180 [P.O.:480; I.V.:2000; IV Piggyback:700] Out: 1815 [Urine:1800; Blood:15] Intake/Output this shift: Total I/O In: 240 [P.O.:240] Out: -   No results for input(s): HGB in the last 72 hours. No results for input(s): WBC, RBC, HCT, PLT in the last 72 hours. No results for input(s): NA, K, CL, CO2, BUN, CREATININE, GLUCOSE, CALCIUM in the last 72 hours. No results for input(s): LABPT, INR in the last 72 hours.  Neurovascular intact Sensation intact distally Intact pulses distally Dorsiflexion/Plantar flexion intact Incision: dressing C/D/I   Assessment/Plan: 1 Day Post-Op Procedure(s) (LRB): TOTAL KNEE ARTHROPLASTY (Left) Up with therapy Discharge home with home health Strongly encouraged her to mobilize more needs to be more active in her rehab phase post op will check on CPM but she has one at home already but if she requires another night needs CPM  D/c some of her sedating meds was a little groggy in the room but did perk up when talking to her  PT to see again this afternoon, goal would be to still get her home this evening as long as cleared by PT for a step to get into house.  Pt voiced understanding and daughter in room with her in agreement.    Anticipated LOS equal to or greater than 2 midnights due to - Age 24 and older with one or more of the following:  - Obesity  - Expected need for hospital services (PT, OT, Nursing) required for safe  discharge  - Anticipated need for  postoperative skilled nursing care or inpatient rehab  - Active co-morbidities: Anemia OR   - Unanticipated findings during/Post Surgery: Slow post-op progression: GI, pain control, mobility  - Patient is a high risk of re-admission due to: None    Chriss Czar 10/24/2020, 1:45 PM

## 2020-10-24 NOTE — Progress Notes (Signed)
Attempted to call Chriss Czar (PA) from [facility] office to inform that patient is in CPM machine. Per Ivin Booty, PT, patient walked in the hall, but has not progressed to steps yet. Per Ivin Booty, PT, patient is not ready for discharge from a PT stand point. Continuing to monitor patient.

## 2020-10-24 NOTE — Progress Notes (Signed)
Physical Therapy Treatment Patient Details Name: Maria [name] MRN: [id number] DOB: [birth year] Today's Date: 10/24/2020    History of Present Illness Patient is 55 y.o. female s/p Lt TKA on 10/23/20 with PMH significant for PE, DVT, HTN, fibromyalgia, OA, depression, anxiety, migraines.    PT Comments    Came back for 3rd session with pt. Pt was able to make it a little further into hallway 15 feet followed by recliner, however was still relying on RW and not steady at times with cues needed for safety and LLE being left behind with pt not focusing on walking due to intense pain. Pt has to ambulate at least 30+ feet with 2 steps down and 6 steps up to enter into her home. At this time I do not feel pt would be able to safely perform this to make it into her house tonight. I continued to be firm and encourage her participation and ensure her she will have pain. However she was groaning and wincing with very little movement pf her LLE.   Follow Up Recommendations  Follow surgeon's recommendation for DC plan and follow-up therapies;Home health PT     Equipment Recommendations       Recommendations for Other Services       Precautions / Restrictions Precautions Precautions: Fall Restrictions Other Position/Activity Restrictions: WBAT    Mobility  Bed Mobility Overal bed mobility: Needs Assistance Bed Mobility: Supine to Sit     Supine to sit: HOB elevated;Min assist     General bed mobility comments: Cues for use of bed rail and assist to bring Lt LE off EOB due to weight of knee immobilizer and pain.    Transfers Overall transfer level: Needs assistance Equipment used: Rolling walker (2 wheeled) Transfers: Sit to/from Stand Sit to Stand: Min assist Stand pivot transfers: Min assist       General transfer comment: Cues for RW safety and hand placement, as well as safety and balance when standing.  Ambulation/Gait Ambulation/Gait assistance: Min assist Gait Distance  (Feet): 15 feet Assistive device: Rolling walker (2 wheeled) Gait Pattern/deviations: Step-to pattern;Decreased stride length;Decreased weight shift to left;Decreased stance time - right     General Gait Details: difficulty holding head up, unsafe tiwth RW at times, and reminder for cues for safety sequencing with RW. At times she would leave the L LE behind with decreased WB and heavily leanign forward about to fall with assistance required from PT.   Stairs             Wheelchair Mobility    Modified Rankin (Stroke Patients Only)       Balance                                            Cognition Arousal/Alertness: Awake/alert (still does not hold eyes open during entire session, mumbles and has to ask her to repeat) Behavior During Therapy: WFL for tasks assessed/performed Overall Cognitive Status: Within Functional Limits for tasks assessed                                        Exercises      General Comments        Pertinent Vitals/Pain Pain Score: 10-Worst pain ever Pain Location: Lt knee and left thigh Pain Descriptors /  Indicators: Grimacing;Guarding;Sore;Throbbing Pain Intervention(s): Monitored during session;Repositioned;Ice applied    Home Living                      Prior Function            PT Goals (current goals can now be found in the care plan section) Acute Rehab PT Goals Patient Stated Goal: regain independence PT Goal Formulation: With patient Time For Goal Achievement: 10/30/20 Potential to Achieve Goals: Good Progress towards PT goals: Progressing toward goals    Frequency    7X/week      PT Plan      Co-evaluation              AM-PAC PT "6 Clicks" Mobility   Outcome Measure  Help needed turning from your back to your side while in a flat bed without using bedrails?: A Little Help needed moving from lying on your back to sitting on the side of a flat bed without using  bedrails?: A Little Help needed moving to and from a bed to a chair (including a wheelchair)?: A Little Help needed standing up from a chair using your arms (e.g., wheelchair or bedside chair)?: A Lot Help needed to walk in hospital room?: A Lot Help needed climbing 3-5 steps with a railing? : A Lot 6 Click Score: 15    End of Session Equipment Utilized During Treatment: Gait belt;Left knee immobilizer Activity Tolerance: Patient limited by pain Patient left: in chair;with call bell/phone within reach;with chair alarm set;with family/visitor present Nurse Communication: Mobility status PT Visit Diagnosis: Muscle weakness (generalized) (M62.81);Difficulty in walking, not elsewhere classified (R26.2)     Time: 1600-1630 PT Time Calculation (min) (ACUTE ONLY): 30 min  Charges:  $Gait Training: 8-22 mins                     Nea Gittens, PT, MPT Acute Rehabilitation Services Office: [phone] Pager: [phone] 10/24/2020    Clide Dales 10/24/2020, 6:09 PM

## 2020-10-24 NOTE — Progress Notes (Signed)
Orthopedic Tech Progress Note Patient Details:  Maria [name] February 03, 1966 [id number]  CPM Left Knee CPM Left Knee: On Left Knee Flexion (Degrees): 90 Left Knee Extension (Degrees): 0 Additional Comments: foot roll  Post Interventions Patient Tolerated: Well Instructions Provided: Care of device  Maria [name] 10/24/2020, 4:45 PM

## 2020-10-24 NOTE — Discharge Summary (Addendum)
PATIENT ID: Maria [name]        MRN:  [id number]          DOB/AGE: [birth year] / 55 y.o.    DISCHARGE SUMMARY  ADMISSION DATE:    10/23/2020 DISCHARGE DATE:  10/25/2020  ADMISSION DIAGNOSIS: S/P total knee arthroplasty, left [Z96.652]    DISCHARGE DIAGNOSIS:  OA LEFT KNEE    ADDITIONAL DIAGNOSIS: Active Problems:   S/P total knee arthroplasty, left  Past Medical History:  Diagnosis Date   Anxiety    Arthritis    Benign tumor of abdominal wall    Chronic back pain    Depression    DVT (deep venous thrombosis) (HCC)    Fibromyalgia    Hypertension    Iron deficiency anemia    Migraines    PE (pulmonary embolism)     PROCEDURE: Procedure(s): TOTAL KNEE ARTHROPLASTY Left on 10/23/2020  CONSULTS: PT     HISTORY:  See H&P in chart  HOSPITAL COURSE:  Maria [name] is a 55 y.o. admitted on 10/23/2020 and found to have a diagnosis of OA LEFT KNEE.  After appropriate laboratory studies were obtained  they were taken to the operating room on 10/23/2020 and underwent  Procedure(s): TOTAL KNEE ARTHROPLASTY  Left.   They were given perioperative antibiotics:  Anti-infectives (From admission, onward)    Start     Dose/Rate Route Frequency Ordered Stop   10/23/20 0600  ceFAZolin (ANCEF) IVPB 2g/100 mL premix        2 g 200 mL/hr over 30 Minutes Intravenous On call to O.R. 10/23/20 [id number] 10/23/20 0740     .  Tolerated the procedure well.  Placed with a foley intraoperatively.     POD #1, allowed out of bed to a chair.  PT for ambulation and exercise program.  Foley D/C'd in morning.  IV saline locked.  O2 discontionued.  The remainder of the hospital course was dedicated to ambulation and strengthening.   The patient was discharged on 2 days post op in  Stable condition.  Blood products given:none  DIAGNOSTIC STUDIES: Recent vital signs:  Patient Vitals for the past 24 hrs:  BP Temp Temp src Pulse Resp SpO2  10/24/20 0942 (!) 164/85 99.5 F (37.5 C) Oral (!) 58 20 98 %   10/24/20 0518 136/82 98.6 F (37 C) Oral 61 18 94 %  10/24/20 0201 114/72 98 F (36.7 C) Oral 60 17 98 %  10/23/20 2145 (!) 150/86 98.1 F (36.7 C) Oral (!) 57 17 100 %  10/23/20 1914 (!) 196/95 98.3 F (36.8 C) Oral (!) 57 18 100 %  10/23/20 1645 (!) 198/91 98.1 F (36.7 C) -- (!) 50 18 99 %  10/23/20 1620 (!) 157/96 -- -- (!) 48 -- 100 %  10/23/20 1600 (!) 164/131 -- -- (!) 50 -- 100 %  10/23/20 1550 (!) 168/89 -- -- (!) 48 -- 97 %  10/23/20 1541 (!) 160/86 -- -- -- -- --  10/23/20 1525 (!) 157/96 -- -- (!) 56 -- 96 %  10/23/20 1519 -- -- -- 63 -- 100 %  10/23/20 1518 (!) 163/144 -- -- (!) 51 -- 100 %  10/23/20 1515 (!) 175/98 -- -- -- -- 100 %  10/23/20 1505 (!) 158/106 -- -- -- -- 98 %  10/23/20 1500 (!) 178/90 -- -- -- -- 97 %  10/23/20 1340 (!) 179/110 -- -- -- -- --  10/23/20 1300 (!) 198/93 -- -- -- -- --  10/23/20 1245 Marland Kitchen)  178/93 -- -- -- -- --  10/23/20 1230 (!) 189/108 -- -- -- -- --  10/23/20 1215 (!) 171/108 98 F (36.7 C) -- 67 20 99 %  10/23/20 1210 (!) 170/149 -- -- (!) 59 -- 96 %  10/23/20 1200 (!) 173/98 -- -- (!) 58 11 100 %  10/23/20 1145 (!) 157/94 98 F (36.7 C) -- 61 16 99 %       Recent laboratory studies: No results for input(s): WBC, HGB, HCT, PLT in the last 168 hours. No results for input(s): NA, K, CL, CO2, BUN, CREATININE, GLUCOSE, CALCIUM in the last 168 hours. Lab Results  Component Value Date   INR 0.9 10/15/2020   INR 0.91 03/02/2016   INR 1.80 (L) 11/08/2012   PROTIME 21.6 (H) 11/08/2012   PROTIME 20.4 (H) 05/23/2012   PROTIME 25.2 (H) 05/07/2012     Recent Radiographic Studies :  No results found.  DISCHARGE INSTRUCTIONS:    DISCHARGE MEDICATIONS:   Allergies as of 10/24/2020       Reactions   Hydrocodone Nausea And Vomiting   N/V lasted a few days   Other    Refused blood transfusion.        Medication List     STOP taking these medications    etodolac 300 MG capsule Commonly known as: LODINE   Rivaroxaban  Stater Pack (15 mg and 20 mg) Commonly known as: XARELTO STARTER PACK       TAKE these medications    acetaminophen 325 MG tablet Commonly known as: Tylenol Take 2 tablets (650 mg total) by mouth every 4 (four) hours as needed.   alprazolam 2 MG tablet Commonly known as: XANAX Take 2 mg by mouth 2 (two) times daily as needed for anxiety.   apixaban 2.5 MG Tabs tablet Commonly known as: Eliquis Take 1 tablet (2.5 mg total) by mouth 2 (two) times daily.   escitalopram 10 MG tablet Commonly known as: LEXAPRO Take 10 mg by mouth every evening.   hydrOXYzine 25 MG tablet Commonly known as: ATARAX/VISTARIL Take 25 mg by mouth in the morning and at bedtime.   oxyCODONE 5 MG immediate release tablet Commonly known as: Oxy IR/ROXICODONE Take one tab po q4-6hrs prn pain, may need 1-2 first couple days   pregabalin 150 MG capsule Commonly known as: LYRICA Take 150 mg by mouth in the morning, at noon, and at bedtime.   tiZANidine 4 MG tablet Commonly known as: ZANAFLEX Take 4 mg by mouth 3 (three) times daily.   Vitamin D (Ergocalciferol) 1.25 MG (50000 UNIT) Caps capsule Commonly known as: DRISDOL Take 50,000 Units by mouth every Monday.               Durable Medical Equipment  (From admission, onward)           Start     Ordered   10/23/20 1612  DME Walker rolling  Once       Question Answer Comment  Walker: With [employer] Wheels   Patient needs a walker to treat with the following condition Primary localized osteoarthritis of left knee      10/23/20 1611            FOLLOW UP VISIT:    Follow-up Information     Maria Server, MD. Schedule an appointment as soon as possible for a visit in 2 weeks.   Specialty: Orthopedic Surgery Why: or as previously scheduled Contact information: [place]  [place] [address] [phone]                 DISPOSITION:   Home  CONDITION:  Stable   Chriss Czar,  PA-C  10/24/2020 11:31 AM

## 2020-10-24 NOTE — Progress Notes (Signed)
Physical Therapy Treatment Patient Details Name: Maria [name] MRN: [id number] DOB: 1965-11-01 Today's Date: 10/24/2020    History of Present Illness Patient is 55 y.o. female s/p Lt TKA on 10/23/20 with PMH significant for PE, DVT, HTN, fibromyalgia, OA, depression, anxiety, migraines.    PT Comments    Continued to be firm with pt about progressign with mobility and need to move knee post surgerical, even once she gets home. She states " when I get home all I have to do is just make it to the bed". Re educated pt on importance of exercises and mobility post TKA. Encouraged pt of safety and walking, pt have slow progression to mobilize slightly past door threshold and then became less responsive and reported being dizzy, L LE was dragging a bit as well. So had to sit down in recliner and elevate B LE.   Will return for another session to help pt progress with goal of DC home.     Follow Up Recommendations  Follow surgeon's recommendation for DC plan and follow-up therapies;Home health PT     Equipment Recommendations       Recommendations for Other Services       Precautions / Restrictions Precautions Precautions: Fall Restrictions Other Position/Activity Restrictions: WBAT    Mobility  Bed Mobility Overal bed mobility: Needs Assistance Bed Mobility: Supine to Sit     Supine to sit: HOB elevated;Min assist     General bed mobility comments: Cues for use of bed rail and assist to bring Lt LE off EOB due to weight of knee immobilizer and pain.    Transfers Overall transfer level: Needs assistance Equipment used: Rolling walker (2 wheeled) Transfers: Sit to/from Stand Sit to Stand: Min assist Stand pivot transfers: Min assist       General transfer comment: Cues for RW safety and hand placement, as well as safety and balance when standing.  Ambulation/Gait Ambulation/Gait assistance: Min assist Gait Distance (Feet): 10 Feet Assistive device: Rolling walker (2  wheeled) Gait Pattern/deviations: Step-to pattern;Decreased stride length;Decreased weight shift to left;Decreased stance time - right     General Gait Details: difficulty holding head up, unsafe tiwth RW at times, and reminder for cues for safety sequencing with RW. At times she would leave the L LE behind with decreased WB and heavily leanign forward about to fall with assistance required from PT.   Stairs             Wheelchair Mobility    Modified Rankin (Stroke Patients Only)       Balance                                            Cognition Arousal/Alertness: Awake/alert (still does not hold eyes open during entire session, mumbles and has to ask her to repeat) Behavior During Therapy: WFL for tasks assessed/performed Overall Cognitive Status: Within Functional Limits for tasks assessed                                        Exercises      General Comments        Pertinent Vitals/Pain Pain Score: 10-Worst pain ever Pain Location: Lt knee and left thigh Pain Descriptors / Indicators: Grimacing;Guarding;Sore;Throbbing Pain Intervention(s): Monitored during session;Repositioned;Ice applied  Home Living                      Prior Function            PT Goals (current goals can now be found in the care plan section) Acute Rehab PT Goals Patient Stated Goal: regain independence PT Goal Formulation: With patient Time For Goal Achievement: 10/30/20 Potential to Achieve Goals: Good Progress towards PT goals: Progressing toward goals    Frequency    7X/week      PT Plan      Co-evaluation              AM-PAC PT "6 Clicks" Mobility   Outcome Measure  Help needed turning from your back to your side while in a flat bed without using bedrails?: A Little Help needed moving from lying on your back to sitting on the side of a flat bed without using bedrails?: A Little Help needed moving to and from a  bed to a chair (including a wheelchair)?: A Little Help needed standing up from a chair using your arms (e.g., wheelchair or bedside chair)?: A Lot Help needed to walk in hospital room?: A Lot Help needed climbing 3-5 steps with a railing? : A Lot 6 Click Score: 15    End of Session Equipment Utilized During Treatment: Gait belt;Left knee immobilizer Activity Tolerance: Patient limited by pain Patient left: in chair;with call bell/phone within reach;with chair alarm set;with family/visitor present Nurse Communication: Mobility status PT Visit Diagnosis: Muscle weakness (generalized) (M62.81);Difficulty in walking, not elsewhere classified (R26.2)     Time: 0923-3007 PT Time Calculation (min) (ACUTE ONLY): 20 min  Charges:  $Gait Training: 8-22 mins                     Ipek Westra, PT, MPT Acute Rehabilitation Services Office: [phone] Pager: [phone] 10/24/2020    Clide Dales 10/24/2020, 6:05 PM

## 2020-10-24 NOTE — Progress Notes (Signed)
Physical Therapy Treatment Patient Details Name: Maria [name] MRN: [id number] DOB: Dec 19, 1965 Today's Date: 10/24/2020    History of Present Illness Patient is 55 y.o. female s/p Lt TKA on 10/23/20 with PMH significant for PE, DVT, HTN, fibromyalgia, OA, depression, anxiety, migraines.    PT Comments    Pt in a great amount of pain this morning still. Discussed at length the need to move a little and began knee AROM and PROM , as well as mobility. Pt very groggy during session with very little ability to open eyes at all. Introduced and performed all TKA exercises with AAROM, and pt with grimacing and moaning in pain. Was able to take a few steps in room to recliner but very groggy and in pain, unable to perform well at all.  Will see again this afternoon, however with steps to enter home , do not feel patient will be progress today safe enough for DC.     Follow Up Recommendations  Follow surgeon's recommendation for DC plan and follow-up therapies;Home health PT     Equipment Recommendations  3in1 (PT)    Recommendations for Other Services       Precautions / Restrictions Precautions Precautions: Fall Restrictions Other Position/Activity Restrictions: WBAT    Mobility  Bed Mobility Overal bed mobility: Needs Assistance Bed Mobility: Supine to Sit     Supine to sit: HOB elevated;Min assist     General bed mobility comments: Cues for use of bed rail and assist to bring Lt LE off EOB due to weight of knee immobilizer and pain.    Transfers Overall transfer level: Needs assistance Equipment used: Rolling walker (2 wheeled) Transfers: Sit to/from Stand Sit to Stand: Mod assist Stand pivot transfers: Mod assist       General transfer comment: Cues for RW safety and hand placement, as well as safety and balance when standing.  Ambulation/Gait Ambulation/Gait assistance: Min assist Gait Distance (Feet): 6 Feet Assistive device: Rolling walker (2 wheeled) Gait  Pattern/deviations: Step-to pattern;Decreased stride length;Decreased weight shift to left;Decreased stance time - right Gait velocity: decr   General Gait Details: Min assist to steady as pt took several small steps, initally having difficulty progressing LLE for swing phase.  Pt prefering TDWB on Lt LE due to knee pain. Distance limited due to pain and pt not alert adn holding eye open during session today.   Stairs             Wheelchair Mobility    Modified Rankin (Stroke Patients Only)       Balance Overall balance assessment: Needs assistance Sitting-balance support: Feet supported Sitting balance-Leahy Scale: Good     Standing balance support: During functional activity;Bilateral upper extremity supported Standing balance-Leahy Scale: Poor                              Cognition Arousal/Alertness: Lethargic (pt very groggy and lethargic during session today, difficulty holding her eyes open , nurse alerted of this) Behavior During Therapy: WFL for tasks assessed/performed Overall Cognitive Status: Within Functional Limits for tasks assessed                                        Exercises Total Joint Exercises Ankle Circles/Pumps: AROM;Left;10 reps Quad Sets:  (tried to get pt to participate int his one and she could not understand how  to do this and not alert enough to focus on this activity.) Heel Slides: AAROM;Left;5 reps;Supine Hip ABduction/ADduction: AAROM;Supine;Left;10 reps Straight Leg Raises: AAROM;Supine;Left;10 reps Goniometric ROM: 0-30 (pt did not tolerate much movment at all required gnetle movement and a lot of encouraging.)    General Comments        Pertinent Vitals/Pain Pain Score: 7  Pain Location: Lt knee and left thigh Pain Descriptors / Indicators: Grimacing;Guarding;Sore;Throbbing Pain Intervention(s): Limited activity within patient's tolerance;Monitored during session;Ice applied    Home Living                       Prior Function            PT Goals (current goals can now be found in the care plan section) Acute Rehab PT Goals Patient Stated Goal: regain independence PT Goal Formulation: With patient Time For Goal Achievement: 10/30/20 Potential to Achieve Goals: Good Progress towards PT goals: Progressing toward goals    Frequency    7X/week      PT Plan      Co-evaluation              AM-PAC PT "6 Clicks" Mobility   Outcome Measure  Help needed turning from your back to your side while in a flat bed without using bedrails?: A Little Help needed moving from lying on your back to sitting on the side of a flat bed without using bedrails?: A Little   Help needed standing up from a chair using your arms (e.g., wheelchair or bedside chair)?: A Lot Help needed to walk in hospital room?: A Lot Help needed climbing 3-5 steps with a railing? : A Lot 6 Click Score: 12    End of Session Equipment Utilized During Treatment: Gait belt;Left knee immobilizer Activity Tolerance: Patient limited by pain Patient left: in chair;with call bell/phone within reach;with chair alarm set;with family/visitor present Nurse Communication: Mobility status PT Visit Diagnosis: Muscle weakness (generalized) (M62.81);Difficulty in walking, not elsewhere classified (R26.2)     Time: 1100-1140 PT Time Calculation (min) (ACUTE ONLY): 40 min  Charges:  $Therapeutic Exercise: 8-22 mins $Therapeutic Activity: 8-22 mins                     Maria [name], PT, MPT Acute Rehabilitation Services Office: [phone] Pager: [phone] 10/24/2020    Maria [name], Gatha Mayer 10/24/2020, 1:10 PM

## 2020-10-25 DIAGNOSIS — M1712 Unilateral primary osteoarthritis, left knee: Secondary | ICD-10-CM | POA: Diagnosis not present

## 2020-10-25 NOTE — Progress Notes (Signed)
Physical Therapy Treatment Patient Details Name: Maria [name] MRN: [id number] DOB: 1966-03-25 Today's Date: 10/25/2020    History of Present Illness Patient is 55 y.o. female s/p Lt TKA on 10/23/20 with PMH significant for PE, DVT, HTN, fibromyalgia, OA, depression, anxiety, migraines.    PT Comments    Pt cooperative and with marked improvement in activity tolerance.  Pt performed therex program with assist and up to ambulate 39ft+ in hall.   Follow Up Recommendations  Follow surgeon's recommendation for DC plan and follow-up therapies;Home health PT     Equipment Recommendations  3in1 (PT)    Recommendations for Other Services       Precautions / Restrictions Precautions Precautions: Fall Restrictions Weight Bearing Restrictions: No Other Position/Activity Restrictions: WBAT    Mobility  Bed Mobility Overal bed mobility: Needs Assistance Bed Mobility: Supine to Sit     Supine to sit: HOB elevated;Min assist     General bed mobility comments: Pt self assisting L LE with UEs    Transfers Overall transfer level: Needs assistance Equipment used: Rolling walker (2 wheeled) Transfers: Sit to/from Stand Sit to Stand: Min assist         General transfer comment: cues for LE management and use of UEs to self assist.  Ambulation/Gait Ambulation/Gait assistance: Min assist Gait Distance (Feet): 54 Feet Assistive device: Rolling walker (2 wheeled) Gait Pattern/deviations: Step-to pattern;Decreased stride length;Decreased weight shift to left;Decreased stance time - right Gait velocity: decr   General Gait Details: cues for sequence, posture, position from [employer]             Wheelchair Mobility    Modified Rankin (Stroke Patients Only)       Balance Overall balance assessment: Needs assistance Sitting-balance support: Feet supported Sitting balance-Leahy Scale: Good     Standing balance support: Bilateral upper extremity supported Standing  balance-Leahy Scale: Poor                              Cognition Arousal/Alertness: Awake/alert Behavior During Therapy: WFL for tasks assessed/performed Overall Cognitive Status: Within Functional Limits for tasks assessed                                        Exercises Total Joint Exercises Ankle Circles/Pumps: AROM;Both;15 reps;Supine Quad Sets: AROM;Both;10 reps;Supine Heel Slides: AAROM;Left;Supine;15 reps Straight Leg Raises: AAROM;Supine;Left;10 reps Goniometric ROM: 0-45    General Comments        Pertinent Vitals/Pain Pain Assessment: 0-10 Pain Score: 7  Pain Location: Lt knee and left thigh Pain Descriptors / Indicators: Grimacing;Guarding;Sore Pain Intervention(s): Limited activity within patient's tolerance;Monitored during session;Premedicated before session;Ice applied    Home Living                      Prior Function            PT Goals (current goals can now be found in the care plan section) Acute Rehab PT Goals Patient Stated Goal: regain independence PT Goal Formulation: With patient Time For Goal Achievement: 10/30/20 Potential to Achieve Goals: Good Progress towards PT goals: Progressing toward goals    Frequency    7X/week      PT Plan Current plan remains appropriate    Co-evaluation              AM-PAC  PT "6 Clicks" Mobility   Outcome Measure  Help needed turning from your back to your side while in a flat bed without using bedrails?: A Little Help needed moving from lying on your back to sitting on the side of a flat bed without using bedrails?: A Little Help needed moving to and from a bed to a chair (including a wheelchair)?: A Little Help needed standing up from a chair using your arms (e.g., wheelchair or bedside chair)?: A Little Help needed to walk in hospital room?: A Little Help needed climbing 3-5 steps with a railing? : A Lot 6 Click Score: 17    End of Session Equipment  Utilized During Treatment: Gait belt;Left knee immobilizer Activity Tolerance: Patient tolerated treatment well;Patient limited by pain Patient left: in chair;with call bell/phone within reach;with chair alarm set;with family/visitor present   PT Visit Diagnosis: Muscle weakness (generalized) (M62.81);Difficulty in walking, not elsewhere classified (R26.2)     Time: 2202-5427 PT Time Calculation (min) (ACUTE ONLY): 30 min  Charges:  $Gait Training: 8-22 mins $Therapeutic Exercise: 8-22 mins                     [place] Pager [phone] Office [phone]    Terri Malerba 10/25/2020, 11:36 AM

## 2020-10-25 NOTE — Plan of Care (Signed)
  Problem: Education: Goal: Knowledge of General Education information will improve Description: Including pain rating scale, medication(s)/side effects and non-pharmacologic comfort measures Outcome: Adequate for Discharge   Problem: Health Behavior/Discharge Planning: Goal: Ability to manage health-related needs will improve Outcome: Adequate for Discharge   Problem: Clinical Measurements: Goal: Ability to maintain clinical measurements within normal limits will improve Outcome: Adequate for Discharge Goal: Will remain free from infection Outcome: Adequate for Discharge Goal: Diagnostic test results will improve Outcome: Adequate for Discharge Goal: Respiratory complications will improve Outcome: Adequate for Discharge Goal: Cardiovascular complication will be avoided Outcome: Adequate for Discharge   Problem: Clinical Measurements: Goal: Will remain free from infection Outcome: Adequate for Discharge   Problem: Clinical Measurements: Goal: Diagnostic test results will improve Outcome: Adequate for Discharge   Problem: Clinical Measurements: Goal: Respiratory complications will improve Outcome: Adequate for Discharge   Problem: Clinical Measurements: Goal: Cardiovascular complication will be avoided Outcome: Adequate for Discharge   Problem: Activity: Goal: Risk for activity intolerance will decrease Outcome: Adequate for Discharge   Problem: Activity: Goal: Risk for activity intolerance will decrease Outcome: Adequate for Discharge   Problem: Nutrition: Goal: Adequate nutrition will be maintained Outcome: Adequate for Discharge   Problem: Nutrition: Goal: Adequate nutrition will be maintained Outcome: Adequate for Discharge   Problem: Coping: Goal: Level of anxiety will decrease Outcome: Adequate for Discharge   Problem: Coping: Goal: Level of anxiety will decrease Outcome: Adequate for Discharge   Problem: Elimination: Goal: Will not experience  complications related to bowel motility Outcome: Adequate for Discharge Goal: Will not experience complications related to urinary retention Outcome: Adequate for Discharge   Problem: Elimination: Goal: Will not experience complications related to bowel motility Outcome: Adequate for Discharge

## 2020-10-25 NOTE — Progress Notes (Signed)
The patient is alert and oriented and has been seen by her physician. The orders for discharge were written. IV has have been removed. Went over discharge instructions with patient and family. She is about to be discharged via wheelchair with all of her belongings.  

## 2020-10-25 NOTE — Progress Notes (Signed)
Subjective: 2 Days Post-Op Procedure(s) (LRB): TOTAL KNEE ARTHROPLASTY (Left) Patient reports pain as moderate.  Per PT note, patient did well and "marked improvement in activity tolerance."   Objective: Vital signs in last 24 hours: Temp:  [98.5 F (36.9 C)-100.5 F (38.1 C)] 98.6 F (37 C) (05/08 0547) Pulse Rate:  [64-90] 90 (05/08 0547) Resp:  [16-18] 17 (05/08 0547) BP: (138-169)/(78-96) 147/95 (05/08 0547) SpO2:  [99 %-100 %] 100 % (05/08 0547)  Intake/Output from previous day: 05/07 0701 - 05/08 0700 In: 360 [P.O.:360] Out: 1300 [Urine:1300] Intake/Output this shift: No intake/output data recorded.  No results for input(s): HGB in the last 72 hours. No results for input(s): WBC, RBC, HCT, PLT in the last 72 hours. No results for input(s): NA, K, CL, CO2, BUN, CREATININE, GLUCOSE, CALCIUM in the last 72 hours. No results for input(s): LABPT, INR in the last 72 hours.  Neurovascular intact Sensation intact distally Intact pulses distally Dorsiflexion/Plantar flexion intact Incision: dressing C/D/I minimal strike-through noted on distal aspect of her incision   Assessment/Plan: 2 Days Post-Op Procedure(s) (LRB): TOTAL KNEE ARTHROPLASTY (Left) Up with therapy Discharge home with home health Continued to encourage patient to work with therapy. Did better with therapy this afternoon per PT note. Patient to likely discharge home with family today.   Anticipated LOS equal to or greater than 2 midnights due to - Age 25 and older with one or more of the following:  - Obesity  - Expected need for hospital services (PT, OT, Nursing) required for safe  discharge  - Anticipated need for postoperative skilled nursing care or inpatient rehab  - Active co-morbidities: Anemia OR   - Unanticipated findings during/Post Surgery: Slow post-op progression: GI, pain control, mobility  - Patient is a high risk of re-admission due to: None    Ethelda Chick 10/25/2020, 11:48  AM

## 2020-10-25 NOTE — Progress Notes (Signed)
Physical Therapy Treatment Patient Details Name: Maria [name] MRN: [id number] DOB: Apr 16, 1966 Today's Date: 10/25/2020    History of Present Illness Patient is 55 y.o. female s/p Lt TKA on 10/23/20 with PMH significant for PE, DVT, HTN, fibromyalgia, OA, depression, anxiety, migraines.    PT Comments    Pt continues very cooperative and eager for return home.  Pt reviewed don/doff KI, ambulated in hall, negotiated stairs and reviewed written HEP.  Pt's daughter is present for session.   Follow Up Recommendations  Follow surgeon's recommendation for DC plan and follow-up therapies;Home health PT     Equipment Recommendations  3in1 (PT)    Recommendations for Other Services       Precautions / Restrictions Precautions Precautions: Fall Required Braces or Orthoses: Knee Immobilizer - Left Knee Immobilizer - Left: Discontinue once straight leg raise with < 10 degree lag Restrictions Weight Bearing Restrictions: No Other Position/Activity Restrictions: WBAT    Mobility  Bed Mobility Overal bed mobility: Needs Assistance Bed Mobility: Supine to Sit;Sit to Supine     Supine to sit: Min assist Sit to supine: Min assist   General bed mobility comments: Pt self assisting L LE with UEs    Transfers Overall transfer level: Needs assistance Equipment used: Rolling walker (2 wheeled) Transfers: Sit to/from Stand Sit to Stand: Min guard;Supervision         General transfer comment: cues for LE management and use of UEs to self assist.  Ambulation/Gait Ambulation/Gait assistance: Min guard;Supervision Gait Distance (Feet): 50 Feet Assistive device: Rolling walker (2 wheeled) Gait Pattern/deviations: Step-to pattern;Decreased step length - right;Decreased step length - left;Shuffle;Trunk flexed Gait velocity: decr   General Gait Details: cues for sequence, posture, position from RW   Stairs Stairs: Yes Stairs assistance: Min assist Stair Management: No rails;One rail  Right;Step to pattern;Forwards;Backwards;With walker;With crutches Number of Stairs: 9 General stair comments: 2 step bkwd with RW and cues for sequence; 5 step fwd with rail and crutch and sequence cues.   Wheelchair Mobility    Modified Rankin (Stroke Patients Only)       Balance Overall balance assessment: Needs assistance Sitting-balance support: Feet supported Sitting balance-Leahy Scale: Good     Standing balance support: No upper extremity supported Standing balance-Leahy Scale: Fair                              Cognition Arousal/Alertness: Awake/alert Behavior During Therapy: WFL for tasks assessed/performed Overall Cognitive Status: Within Functional Limits for tasks assessed                                        Exercises Total Joint Exercises Ankle Circles/Pumps: AROM;Both;15 reps;Supine Quad Sets: AROM;Both;10 reps;Supine Heel Slides: AAROM;Left;Supine;15 reps Straight Leg Raises: AAROM;Supine;Left;10 reps Goniometric ROM: 0-45    General Comments        Pertinent Vitals/Pain Pain Assessment: 0-10 Pain Score: 7  Pain Location: Lt knee and left thigh Pain Descriptors / Indicators: Grimacing;Guarding;Sore Pain Intervention(s): Limited activity within patient's tolerance;Monitored during session;Premedicated before session;Ice applied    Home Living                      Prior Function            PT Goals (current goals can now be found in the care plan section) Acute Rehab PT Goals  Patient Stated Goal: regain independence PT Goal Formulation: With patient Time For Goal Achievement: 10/30/20 Potential to Achieve Goals: Good Progress towards PT goals: Progressing toward goals    Frequency    7X/week      PT Plan Current plan remains appropriate    Co-evaluation              AM-PAC PT "6 Clicks" Mobility   Outcome Measure  Help needed turning from your back to your side while in a flat bed  without using bedrails?: A Little Help needed moving from lying on your back to sitting on the side of a flat bed without using bedrails?: A Little Help needed moving to and from a bed to a chair (including a wheelchair)?: A Little Help needed standing up from a chair using your arms (e.g., wheelchair or bedside chair)?: A Little Help needed to walk in hospital room?: A Little Help needed climbing 3-5 steps with a railing? : A Little 6 Click Score: 18    End of Session Equipment Utilized During Treatment: Gait belt;Left knee immobilizer Activity Tolerance: Patient tolerated treatment well;Patient limited by fatigue Patient left: in bed;with call bell/phone within reach;with family/visitor present Nurse Communication: Mobility status PT Visit Diagnosis: Muscle weakness (generalized) (M62.81);Difficulty in walking, not elsewhere classified (R26.2)     Time: 5038-8828 PT Time Calculation (min) (ACUTE ONLY): 29 min  Charges:  $Gait Training: 8-22 mins $Therapeutic Exercise: 8-22 mins $Therapeutic Activity: 8-22 mins                     Debe Coder PT Acute Rehabilitation Services Pager [phone] Office [phone]    Eddie Koc 10/25/2020, 2:42 PM

## 2020-10-27 ENCOUNTER — Encounter (HOSPITAL_COMMUNITY): Payer: Self-pay | Admitting: Orthopedic Surgery

## 2020-11-03 ENCOUNTER — Encounter (HOSPITAL_COMMUNITY): Payer: Self-pay | Admitting: Emergency Medicine

## 2020-11-03 ENCOUNTER — Other Ambulatory Visit: Payer: Self-pay

## 2020-11-03 ENCOUNTER — Emergency Department (HOSPITAL_COMMUNITY)
Admission: EM | Admit: 2020-11-03 | Discharge: 2020-11-04 | Disposition: A | Discharge status: Home / Self Care | Payer: Medicaid Other | Attending: Emergency Medicine | Admitting: Emergency Medicine

## 2020-11-03 DIAGNOSIS — R072 Precordial pain: Secondary | ICD-10-CM | POA: Diagnosis not present

## 2020-11-03 DIAGNOSIS — Z86711 Personal history of pulmonary embolism: Secondary | ICD-10-CM | POA: Diagnosis not present

## 2020-11-03 DIAGNOSIS — F17210 Nicotine dependence, cigarettes, uncomplicated: Secondary | ICD-10-CM | POA: Insufficient documentation

## 2020-11-03 DIAGNOSIS — Z7901 Long term (current) use of anticoagulants: Secondary | ICD-10-CM | POA: Diagnosis not present

## 2020-11-03 DIAGNOSIS — Z96652 Presence of left artificial knee joint: Secondary | ICD-10-CM | POA: Diagnosis not present

## 2020-11-03 DIAGNOSIS — I10 Essential (primary) hypertension: Secondary | ICD-10-CM | POA: Diagnosis not present

## 2020-11-03 DIAGNOSIS — R079 Chest pain, unspecified: Secondary | ICD-10-CM

## 2020-11-03 NOTE — ED Triage Notes (Signed)
Patient reports intermittent pain across her chest for > 1 year worse today , pain increases with deep inspiration , mild SOB , no cough or fever . Denies emesis or diaphoresis . History of PE .

## 2020-11-03 NOTE — ED Provider Notes (Signed)
  Emergency Medicine Provider in Triage Note   MSE was initiated and I personally evaluated the patient  11:48 PM on Nov 03, 2020 as provider in triage.   Chief Complaint: chest pain  HPI  Patient is a 55 y.o. with a hx of DVT/PE on eliquis who presents to the ED via EMS with complaints of worsened chest pain today. Patient states that she has had problems with intermittent chest pain for about 1 year now however today it acutely worsened. She states she had a panic attack this AM which worsened the pain and she has had trouble breathing today with the discomfort as well. Pain worse with a deep breath. Hx of PE and she is worried this could be causing her sxs currently. Had recent L knee total arthroplasty 10/23/20- had to stop her eliquis for this but has been taking as prescribed post operatively.    Review of Systems  Positive: Chest pain, dyspnea Negative: Hemoptysis, syncope  Physical Exam  There were no vitals taken for this visit.   Gen:   Awake, non toxic  HEENT:  Atraumatic  Resp:  No obvious adventitious breath sounds, not in acute respiratory distress.  Cardiac:  Normal rate Chest:   Right chest wall is mildly tender to palpation.  Abd:   Nondistended, nontender  MSK:   Moves extremities without difficulty  Neuro:  Speech clear   Medical Decision Making   Initiation of care has begun. The patient has been counseled on the process, plan, and necessity for staying for the completion/evaluation, informed that the remainder of the evaluation will be completed by another provider, this initial triage assessment does not replace that evaluation, and the importance of remaining in the ED until their evaluation is complete.  Chest pain work-up initiated.  Had CTA negative for PE in 07/2020, however patient with primary concern for this, quick onset worsening of sxs w/ dyspnea, & recent surgery therefore will check d-dimer.  Clinical Impression  Chest pain         Amaryllis Dyke, PA-C 11/03/20 2356    Lennice Sites, DO 11/04/20 0004

## 2020-11-04 ENCOUNTER — Emergency Department (HOSPITAL_COMMUNITY): Payer: Medicaid Other

## 2020-11-04 LAB — CBC
HCT: 28.9 % — ABNORMAL LOW (ref 36.0–46.0)
Hemoglobin: 8.8 g/dL — ABNORMAL LOW (ref 12.0–15.0)
MCH: 27.8 pg (ref 26.0–34.0)
MCHC: 30.4 g/dL (ref 30.0–36.0)
MCV: 91.2 fL (ref 80.0–100.0)
Platelets: 612 10*3/uL — ABNORMAL HIGH (ref 150–400)
RBC: 3.17 MIL/uL — ABNORMAL LOW (ref 3.87–5.11)
RDW: 13.4 % (ref 11.5–15.5)
WBC: 9 10*3/uL (ref 4.0–10.5)
nRBC: 0 % (ref 0.0–0.2)

## 2020-11-04 LAB — COMPREHENSIVE METABOLIC PANEL
ALT: 15 U/L (ref 0–44)
AST: 19 U/L (ref 15–41)
Albumin: 3 g/dL — ABNORMAL LOW (ref 3.5–5.0)
Alkaline Phosphatase: 69 U/L (ref 38–126)
Anion gap: 6 (ref 5–15)
BUN: 16 mg/dL (ref 6–20)
CO2: 25 mmol/L (ref 22–32)
Calcium: 9.2 mg/dL (ref 8.9–10.3)
Chloride: 103 mmol/L (ref 98–111)
Creatinine, Ser: 0.6 mg/dL (ref 0.44–1.00)
GFR, Estimated: >60 mL/min (ref >60)
Glucose, Bld: 115 mg/dL — ABNORMAL HIGH (ref 70–99)
Potassium: 3.7 mmol/L (ref 3.5–5.1)
Sodium: 134 mmol/L — ABNORMAL LOW (ref 135–145)
Total Bilirubin: 0.6 mg/dL (ref 0.3–1.2)
Total Protein: 7 g/dL (ref 6.5–8.1)

## 2020-11-04 LAB — TROPONIN I (HIGH SENSITIVITY)
Troponin I (High Sensitivity): <2 ng/L (ref <18)
Troponin I (High Sensitivity): 2 ng/L (ref <18)

## 2020-11-04 LAB — D-DIMER, QUANTITATIVE: D-Dimer, Quant: 7.02 ug/mL-FEU — ABNORMAL HIGH (ref 0.00–0.50)

## 2020-11-04 LAB — I-STAT BETA HCG BLOOD, ED (MC, WL, AP ONLY): I-stat hCG, quantitative: <5 m[IU]/mL (ref <5)

## 2020-11-04 MED ORDER — FENTANYL CITRATE (PF) 100 MCG/2ML IJ SOLN
50.0000 ug | Freq: Once | INTRAMUSCULAR | Status: AC
  Administered 2020-11-04: 50 ug via INTRAVENOUS
  Filled 2020-11-04: qty 2

## 2020-11-04 MED ORDER — SODIUM CHLORIDE 0.9 % IV BOLUS
1000.0000 mL | Freq: Once | INTRAVENOUS | Status: AC
  Administered 2020-11-04: 1000 mL via INTRAVENOUS

## 2020-11-04 MED ORDER — IOHEXOL 350 MG/ML SOLN
75.0000 mL | Freq: Once | INTRAVENOUS | Status: AC | PRN
  Administered 2020-11-04: 75 mL via INTRAVENOUS

## 2020-11-04 NOTE — ED Provider Notes (Signed)
[facility] EMERGENCY DEPARTMENT Provider Note   CSN: [id number] Arrival date & time: 11/03/20  2343     History Chief Complaint  Patient presents with  . Chest Pain    Maria [name] is a 55 y.o. female.  The history is provided by the patient.  Chest Pain Pain location:  Substernal area Pain radiates to:  Does not radiate Pain severity:  Mild Onset quality:  Gradual Timing:  Intermittent Progression:  Waxing and waning Chronicity:  Recurrent Context: at rest   Relieved by:  Nothing Worsened by:  Nothing Ineffective treatments:  None tried Associated symptoms: no abdominal pain, no AICD problem, no anorexia, no anxiety, no back pain, no cough, no diaphoresis, no fever, no palpitations, no shortness of breath and no vomiting   Risk factors: prior DVT/PE and surgery        Past Medical History:  Diagnosis Date  . Anxiety   . Arthritis   . Benign tumor of abdominal wall   . Chronic back pain   . Depression   . DVT (deep venous thrombosis) ([place])   . Fibromyalgia   . Hypertension   . Iron deficiency anemia   . Migraines   . PE (pulmonary embolism)     Patient Active Problem List   Diagnosis Date Noted  . S/P total knee arthroplasty, left 10/23/2020  . Unilateral primary osteoarthritis, left knee 08/06/2019  . Fibromyalgia muscle pain 01/13/2014  . Chronic back pain 01/13/2014  . Iron deficiency anemia 11/15/2011  . Pulmonary embolism, bilateral ([place]) 06/03/2011    Past Surgical History:  Procedure Laterality Date  . CESAREAN SECTION    . ENDOMETRIAL ABLATION    . HERNIA REPAIR    . KNEE ARTHROSCOPY WITH DRILLING/MICROFRACTURE Left 12/07/2018   Procedure: KNEE ARTHROSCOPY WITH MICROFRACTURE with chondroplasty;  Surgeon: Earlie Server, MD;  Location: [facility];  Service: Orthopedics;  Laterality: Left;  . KNEE ARTHROSCOPY WITH MEDIAL MENISECTOMY Left 12/07/2018   Procedure: KNEE ARTHROSCOPY LATERAL MENISECTOMY;  Surgeon:  Earlie Server, MD;  Location: [facility];  Service: Orthopedics;  Laterality: Left;  . TOTAL KNEE ARTHROPLASTY Left 10/23/2020   Procedure: TOTAL KNEE ARTHROPLASTY;  Surgeon: Earlie Server, MD;  Location: [facility];  Service: Orthopedics;  Laterality: Left;     OB History   No obstetric history on file.     Family History  Problem Relation Age of Onset  . Cancer Mother   . Cancer Father   . Diabetes Sister   . Hypertension Sister     Social History   Tobacco Use  . Smoking status: Current Every Day Smoker    Packs/day: 0.15    Types: Cigarettes  . Smokeless tobacco: Never Used  . Tobacco comment: 1 pack/week  Vaping Use  . Vaping Use: Never used  Substance Use Topics  . Alcohol use: Yes    Comment: occas.  . Drug use: No    Home Medications Prior to Admission medications   Medication Sig Start Date End Date Taking? Authorizing Provider  acetaminophen (TYLENOL) 325 MG tablet Take 2 tablets (650 mg total) by mouth every 4 (four) hours as needed. 10/23/20 10/23/21  Chadwell, Vonna Kotyk, PA-C  acetaminophen (TYLENOL) 650 MG CR tablet Take 1,300 mg by mouth every 8 (eight) hours as needed for pain.    [provider]  alprazolam Duanne Moron) 2 MG tablet Take 2 mg by mouth 2 (two) times daily as needed for anxiety. 12/04/18   [provider]  apixaban (ELIQUIS) 2.5 MG TABS tablet Take 1 tablet (2.5 mg total) by mouth 2 (two) times daily. 10/23/20   Chadwell, Vonna Kotyk, PA-C  escitalopram (LEXAPRO) 10 MG tablet Take 10 mg by mouth every evening. 08/28/20   [provider]  hydrOXYzine (ATARAX/VISTARIL) 25 MG tablet Take 25 mg by mouth in the morning and at bedtime. 08/28/20   [provider]  oxyCODONE (OXY IR/ROXICODONE) 5 MG immediate release tablet Take one tab po q4-6hrs prn pain, may need 1-2 first couple days 10/23/20   Chadwell, Vonna Kotyk, PA-C  pregabalin (LYRICA) 150 MG capsule Take 150 mg by mouth in the morning, at noon, and at bedtime.     [provider]  tiZANidine (ZANAFLEX) 4 MG tablet Take 4 mg by mouth 3 (three) times daily. 08/28/20   [provider]  Vitamin D, Ergocalciferol, (DRISDOL) 1.25 MG (50000 UNIT) CAPS capsule Take 50,000 Units by mouth every Monday. 08/02/19   [provider]    Allergies    Hydrocodone and Other  Review of Systems   Review of Systems  Constitutional: Negative for chills, diaphoresis and fever.  HENT: Negative for ear pain and sore throat.   Eyes: Negative for pain and visual disturbance.  Respiratory: Negative for cough and shortness of breath.   Cardiovascular: Positive for chest pain. Negative for palpitations.  Gastrointestinal: Negative for abdominal pain, anorexia and vomiting.  Genitourinary: Negative for dysuria and hematuria.  Musculoskeletal: Negative for arthralgias and back pain.  Skin: Negative for color change and rash.  Neurological: Negative for seizures and syncope.  Psychiatric/Behavioral: The patient is nervous/anxious.   All other systems reviewed and are negative.   Physical Exam Updated Vital Signs BP 117/68   Pulse 73   Temp 97.7 F (36.5 C) (Oral)   Resp 18   SpO2 98%   Physical Exam Vitals and nursing note reviewed.  Constitutional:      General: She is not in acute distress.    Appearance: She is well-developed. She is not ill-appearing.  HENT:     Head: Normocephalic and atraumatic.  Eyes:     Extraocular Movements: Extraocular movements intact.     Conjunctiva/sclera: Conjunctivae normal.     Pupils: Pupils are equal, round, and reactive to light.  Cardiovascular:     Rate and Rhythm: Normal rate and regular rhythm.     Pulses:          Radial pulses are 2+ on the right side and 2+ on the left side.       Dorsalis pedis pulses are 2+ on the right side and 2+ on the left side.     Heart sounds: Normal heart sounds. No murmur heard.   Pulmonary:     Effort: Pulmonary effort is normal. No respiratory distress.      Breath sounds: Normal breath sounds. No decreased breath sounds or wheezing.  Abdominal:     Palpations: Abdomen is soft.     Tenderness: There is no abdominal tenderness.  Musculoskeletal:        General: Normal range of motion.     Cervical back: Normal range of motion and neck supple.  Skin:    General: Skin is warm and dry.     Capillary Refill: Capillary refill takes less than 2 seconds.  Neurological:     General: No focal deficit present.     Mental Status: She is alert.  Psychiatric:        Mood and Affect: Mood normal.  ED Results / Procedures / Treatments   Labs (all labs ordered are listed, but only abnormal results are displayed) Labs Reviewed  CBC - Abnormal; Notable for the following components:      Result Value   RBC 3.17 (*)    Hemoglobin 8.8 (*)    HCT 28.9 (*)    Platelets 612 (*)    All other components within normal limits  COMPREHENSIVE METABOLIC PANEL - Abnormal; Notable for the following components:   Sodium 134 (*)    Glucose, Bld 115 (*)    Albumin 3.0 (*)    All other components within normal limits  D-DIMER, QUANTITATIVE - Abnormal; Notable for the following components:   D-Dimer, Quant 7.02 (*)    All other components within normal limits  I-STAT BETA HCG BLOOD, ED ([facility], [facility], [facility] ONLY)  TROPONIN I (HIGH SENSITIVITY)  TROPONIN I (HIGH SENSITIVITY)    EKG EKG Interpretation  Date/Time:  Tuesday Nov 03 2020 23:57:48 EDT Ventricular Rate:  70 PR Interval:  176 QRS Duration: 84 QT Interval:  398 QTC Calculation: 429 R Axis:   3 Text Interpretation: Normal sinus rhythm Low voltage QRS Cannot rule out Anterior infarct , age undetermined Abnormal ECG Confirmed by Lennice Sites [phone]) on 11/04/2020 2:51:19 AM   Radiology DG Chest 2 View  Result Date: 11/04/2020 CLINICAL DATA:  Chest pain EXAM: CHEST - 2 VIEW COMPARISON:  August 12, 2019 FINDINGS: The heart size and mediastinal contours are within normal limits. Similar eventration of left  hemidiaphragm. No focal consolidation. No pleural effusion. No pneumothorax. The visualized skeletal structures are unremarkable. IMPRESSION: No active cardiopulmonary disease. Electronically Signed   By: Dahlia Bailiff MD   On: 11/04/2020 00:45   CT Angio Chest PE W and/or Wo Contrast  Result Date: 11/04/2020 CLINICAL DATA:  Dyspnea, chest pain EXAM: CT ANGIOGRAPHY CHEST WITH CONTRAST TECHNIQUE: Multidetector CT imaging of the chest was performed using the standard protocol during bolus administration of intravenous contrast. Multiplanar CT image reconstructions and MIPs were obtained to evaluate the vascular anatomy. CONTRAST:  35mL OMNIPAQUE IOHEXOL 350 MG/ML SOLN COMPARISON:  None. FINDINGS: Cardiovascular: There is adequate opacification of the pulmonary arterial tree. There is no intraluminal filling defect identified to suggest acute pulmonary embolism. The central pulmonary arteries are mildly enlarged suggesting changes of pulmonary arterial hypertension. Global cardiac size is within normal limits. No significant coronary artery calcification. No pericardial effusion. Mild atherosclerotic calcification is seen within the thoracic aorta. No aortic aneurysm. Mediastinum/Nodes: No enlarged mediastinal, hilar, or axillary lymph nodes. Thyroid gland, trachea, and esophagus demonstrate no significant findings. Lungs/Pleura: Mild bibasilar atelectasis. The lungs are otherwise clear. Previously noted pulmonary nodule has resolved, likely inflammatory on the prior examination. No pneumothorax or pleural effusion. Central airways are widely patent. Upper Abdomen: No acute abnormality. Musculoskeletal: No acute bone abnormality. Review of the MIP images confirms the above findings. IMPRESSION: No pulmonary embolism. Morphologic changes in keeping with pulmonary arterial hypertension. Aortic Atherosclerosis (ICD10-I70.0). Electronically Signed   By: Fidela Salisbury MD   On: 11/04/2020 05:53     Procedures Procedures   Medications Ordered in ED Medications  fentaNYL (SUBLIMAZE) injection 50 mcg (50 mcg Intravenous Given 11/04/20 0337)  sodium chloride 0.9 % bolus 1,000 mL (0 mLs Intravenous Stopped 11/04/20 0504)  iohexol (OMNIPAQUE) 350 MG/ML injection 75 mL (75 mLs Intravenous Contrast Given 11/04/20 0542)    ED Course  I have reviewed the triage vital signs and the nursing notes.  Pertinent labs & imaging results  that were available during my care of the patient were reviewed by me and considered in my medical decision making (see chart for details).    MDM Rules/Calculators/A&P                          Tamyka Bezio is here with chest pain.  History of blood clot not on blood thinners.  Recent left knee surgery.  Having some chest pain but states that is fairly chronic for her.  Felt like she had a panic attack today but was told to come to the ED by her family doctor.  Patient had elevated D-dimer while in triage.  2 troponins are normal.  Noncardiac sounding chest pain.  2 troponins are normal and EKG is unremarkable.  Doubt ACS.  CT scan did not show any evidence of blood clot.  Lab work was otherwise unremarkable.  Hemoglobin 8.8 but no active bleeding.  Suspect mild drop from recent surgery.  Otherwise lab work is unremarkable.  Vital signs are normal.  Given reassurance and discharged in ED in good condition.  This chart was dictated using voice recognition software.  Despite best efforts to proofread,  errors can occur which can change the documentation meaning.    Final Clinical Impression(s) / ED Diagnoses Final diagnoses:  Nonspecific chest pain    Rx / DC Orders ED Discharge Orders    None       Lennice Sites, DO 11/04/20 1497

## 2020-11-04 NOTE — ED Notes (Signed)
Pt calls out to notify this RN that her hands "are darker than they normally are" - This RN assessed pt - pulses strong on each side and cap refill less than 3 seconds.  Pt also notifies this RN that her L leg feels different. Pt reports that sensation feels heavier on the left side - pt is able to move both legs, pulses strong, and cap refill less than 3 seconds.  Pt unable to hold up left leg due to recent surgery.

## 2020-11-04 NOTE — ED Notes (Signed)
Patient verbalizes understanding of discharge instructions. Opportunity for questioning and answers were provided. Armband removed by staff, pt discharged from ED via wheelchair.  

## 2020-12-23 ENCOUNTER — Ambulatory Visit (INDEPENDENT_AMBULATORY_CARE_PROVIDER_SITE_OTHER): Payer: Medicaid Other | Admitting: Podiatry

## 2020-12-23 ENCOUNTER — Other Ambulatory Visit: Payer: Self-pay

## 2020-12-23 ENCOUNTER — Encounter: Payer: Self-pay | Admitting: Podiatry

## 2020-12-23 DIAGNOSIS — D689 Coagulation defect, unspecified: Secondary | ICD-10-CM | POA: Diagnosis not present

## 2020-12-23 DIAGNOSIS — M79675 Pain in left toe(s): Secondary | ICD-10-CM

## 2020-12-23 DIAGNOSIS — L84 Corns and callosities: Secondary | ICD-10-CM | POA: Diagnosis not present

## 2020-12-23 DIAGNOSIS — M79674 Pain in right toe(s): Secondary | ICD-10-CM

## 2020-12-23 DIAGNOSIS — B351 Tinea unguium: Secondary | ICD-10-CM | POA: Diagnosis not present

## 2020-12-26 NOTE — Progress Notes (Signed)
  Subjective:  Patient ID: Maria [name], female    DOB: 05/13/1966,  MRN: [id number]  Maria [name] presents to clinic today for at risk foot care with h/o coagulopathy and callus(es) b/l feet and painful thick toenails that are difficult to trim. Painful toenails interfere with ambulation. Aggravating factors include wearing enclosed shoe gear. Pain is relieved with periodic professional debridement. Painful calluses are aggravated when weightbearing with and without shoegear. Pain is relieved with periodic professional debridement.  She is on blood thinner, Eliquis.  She voices no new pedal problems on today's visit.  Allergies  Allergen Reactions   Hydrocodone Nausea And Vomiting    N/V lasted a few days   Other     Refused blood transfusion.    Review of Systems: Negative except as noted in the HPI. Objective:   Constitutional Maria [name] is a pleasant 55 y.o. African American female, WD, WN in NAD. AAO x 3.   Vascular Capillary fill time to digits <3 seconds b/l lower extremities. Palpable DP pulse(s) b/l lower extremities Faintly palpable PT pulse(s) b/l lower extremities. Pedal hair sparse. Lower extremity skin temperature gradient within normal limits. No pain with calf compression b/l. No edema noted b/l lower extremities. No cyanosis or clubbing noted.  Neurologic Normal speech. Oriented to person, place, and time. Protective sensation intact 5/5 intact bilaterally with 10g monofilament b/l. Vibratory sensation intact b/l.  Dermatologic Pedal skin with normal turgor, texture and tone b/l lower extremities No open wounds b/l lower extremities No interdigital macerations b/l lower extremities Toenails 1-5 b/l elongated, discolored, dystrophic, thickened, crumbly with subungual debris and tenderness to dorsal palpation. Hyperkeratotic lesion(s) right foot, L 2nd toe, and L 3rd toe.  No erythema, no edema, no drainage, no fluctuance.  Orthopedic: Normal muscle strength 5/5 to all lower  extremity muscle groups bilaterally. No pain crepitus or joint limitation noted with ROM b/l. Hammertoe(s) noted to the b/l lower extremities. Pes planus deformity noted b/l.    Radiographs: None Assessment:   1. Pain due to onychomycosis of toenails of both feet   2. Corns and callosities   3. Coagulation disorder ([place])    Plan:  -Examined patient. -Medicaid ABN signed for this year. Patient consents for services of paring of corns and callus. Copy has been placed in patient chart. -Patient to continue soft, supportive shoe gear daily. -Toenails 1-5 b/l were debrided in length and girth with sterile nail nippers and dremel without iatrogenic bleeding.  -Corn(s) L 2nd toe and L 3rd toe pared utilizing sterile scalpel blade without complication or incident. Total number debrided=2. -Callus(es) right foot pared utilizing sterile scalpel blade without complication or incident. Total number debrided =1. -Patient to report any pedal injuries to medical professional immediately. -Patient/POA to call should there be question/concern in the interim.  Return in about 3 months (around 03/25/2021) for nail trim.  Marzetta Board, DPM

## 2021-02-18 DIAGNOSIS — S0990XA Unspecified injury of head, initial encounter: Secondary | ICD-10-CM

## 2021-02-18 HISTORY — DX: Unspecified injury of head, initial encounter: S09.90XA

## 2021-02-21 ENCOUNTER — Emergency Department (HOSPITAL_COMMUNITY)
Admission: EM | Admit: 2021-02-21 | Discharge: 2021-02-21 | Disposition: A | Discharge status: Home / Self Care | Payer: Medicaid Other | Attending: Emergency Medicine | Admitting: Emergency Medicine

## 2021-02-21 ENCOUNTER — Emergency Department (HOSPITAL_COMMUNITY): Payer: Medicaid Other

## 2021-02-21 ENCOUNTER — Ambulatory Visit (HOSPITAL_COMMUNITY)
Admission: EM | Admit: 2021-02-21 | Discharge: 2021-02-21 | Disposition: A | Discharge status: Nursing Home (Includes ICF) | Payer: No Typology Code available for payment source

## 2021-02-21 ENCOUNTER — Encounter (HOSPITAL_COMMUNITY): Payer: Self-pay | Admitting: Emergency Medicine

## 2021-02-21 ENCOUNTER — Other Ambulatory Visit: Payer: Self-pay

## 2021-02-21 DIAGNOSIS — I10 Essential (primary) hypertension: Secondary | ICD-10-CM | POA: Diagnosis not present

## 2021-02-21 DIAGNOSIS — S199XXA Unspecified injury of neck, initial encounter: Secondary | ICD-10-CM | POA: Insufficient documentation

## 2021-02-21 DIAGNOSIS — I671 Cerebral aneurysm, nonruptured: Secondary | ICD-10-CM

## 2021-02-21 DIAGNOSIS — F17210 Nicotine dependence, cigarettes, uncomplicated: Secondary | ICD-10-CM | POA: Insufficient documentation

## 2021-02-21 DIAGNOSIS — Y92410 Unspecified street and highway as the place of occurrence of the external cause: Secondary | ICD-10-CM | POA: Diagnosis not present

## 2021-02-21 DIAGNOSIS — R55 Syncope and collapse: Secondary | ICD-10-CM | POA: Diagnosis not present

## 2021-02-21 DIAGNOSIS — I607 Nontraumatic subarachnoid hemorrhage from unspecified intracranial artery: Secondary | ICD-10-CM | POA: Diagnosis not present

## 2021-02-21 DIAGNOSIS — Z96652 Presence of left artificial knee joint: Secondary | ICD-10-CM | POA: Diagnosis not present

## 2021-02-21 DIAGNOSIS — S1980XA Other specified injuries of unspecified part of neck, initial encounter: Secondary | ICD-10-CM

## 2021-02-21 DIAGNOSIS — Z79899 Other long term (current) drug therapy: Secondary | ICD-10-CM | POA: Diagnosis not present

## 2021-02-21 DIAGNOSIS — Z7901 Long term (current) use of anticoagulants: Secondary | ICD-10-CM | POA: Diagnosis not present

## 2021-02-21 LAB — CBC WITH DIFFERENTIAL/PLATELET
Abs Immature Granulocytes: 0.02 10*3/uL (ref 0.00–0.07)
Basophils Absolute: 0 10*3/uL (ref 0.0–0.1)
Basophils Relative: 1 %
Eosinophils Absolute: 0.1 10*3/uL (ref 0.0–0.5)
Eosinophils Relative: 2 %
HCT: 36.2 % (ref 36.0–46.0)
Hemoglobin: 11.5 g/dL — ABNORMAL LOW (ref 12.0–15.0)
Immature Granulocytes: 0 %
Lymphocytes Relative: 46 %
Lymphs Abs: 3.3 10*3/uL (ref 0.7–4.0)
MCH: 27.8 pg (ref 26.0–34.0)
MCHC: 31.8 g/dL (ref 30.0–36.0)
MCV: 87.7 fL (ref 80.0–100.0)
Monocytes Absolute: 0.5 10*3/uL (ref 0.1–1.0)
Monocytes Relative: 6 %
Neutro Abs: 3.3 10*3/uL (ref 1.7–7.7)
Neutrophils Relative %: 45 %
Platelets: 382 10*3/uL (ref 150–400)
RBC: 4.13 MIL/uL (ref 3.87–5.11)
RDW: 15.7 % — ABNORMAL HIGH (ref 11.5–15.5)
WBC: 7.3 10*3/uL (ref 4.0–10.5)
nRBC: 0 % (ref 0.0–0.2)

## 2021-02-21 LAB — COMPREHENSIVE METABOLIC PANEL
ALT: 12 U/L (ref 0–44)
AST: 16 U/L (ref 15–41)
Albumin: 3.4 g/dL — ABNORMAL LOW (ref 3.5–5.0)
Alkaline Phosphatase: 83 U/L (ref 38–126)
Anion gap: 7 (ref 5–15)
BUN: 11 mg/dL (ref 6–20)
CO2: 27 mmol/L (ref 22–32)
Calcium: 8.8 mg/dL — ABNORMAL LOW (ref 8.9–10.3)
Chloride: 108 mmol/L (ref 98–111)
Creatinine, Ser: 0.76 mg/dL (ref 0.44–1.00)
GFR, Estimated: >60 mL/min (ref >60)
Glucose, Bld: 109 mg/dL — ABNORMAL HIGH (ref 70–99)
Potassium: 3.3 mmol/L — ABNORMAL LOW (ref 3.5–5.1)
Sodium: 142 mmol/L (ref 135–145)
Total Bilirubin: 0.3 mg/dL (ref 0.3–1.2)
Total Protein: 6.9 g/dL (ref 6.5–8.1)

## 2021-02-21 LAB — MAGNESIUM: Magnesium: 2 mg/dL (ref 1.7–2.4)

## 2021-02-21 MED ORDER — HYDROMORPHONE HCL 1 MG/ML IJ SOLN
1.0000 mg | Freq: Once | INTRAMUSCULAR | Status: AC
  Administered 2021-02-21: 1 mg via INTRAVENOUS
  Filled 2021-02-21: qty 1

## 2021-02-21 MED ORDER — IOHEXOL 350 MG/ML SOLN
50.0000 mL | Freq: Once | INTRAVENOUS | Status: AC | PRN
  Administered 2021-02-21: 50 mL via INTRAVENOUS

## 2021-02-21 MED ORDER — ONDANSETRON HCL 4 MG/2ML IJ SOLN
4.0000 mg | Freq: Once | INTRAMUSCULAR | Status: AC
  Administered 2021-02-21: 4 mg via INTRAVENOUS
  Filled 2021-02-21: qty 2

## 2021-02-21 MED ORDER — METHOCARBAMOL 500 MG PO TABS: 500.0000 mg | ORAL_TABLET | Freq: Three times a day (TID) | ORAL | 0 refills | Status: DC | PRN

## 2021-02-21 MED ORDER — METHOCARBAMOL 500 MG PO TABS
500.0000 mg | ORAL_TABLET | Freq: Once | ORAL | Status: AC
  Administered 2021-02-21: 500 mg via ORAL
  Filled 2021-02-21: qty 1

## 2021-02-21 MED ORDER — HYDROMORPHONE HCL 1 MG/ML IJ SOLN: 1.0000 mg | Freq: Once | INTRAMUSCULAR | Status: DC
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
## 2021-02-21 NOTE — ED Provider Notes (Signed)
Reviewed patient's history and chief complaint.  Spoke with patient regarding her symptoms and I am referring her to the emergency room given her history of head trauma, headache and head numbness posteriorly along with Eliquis use.  Patient agreeable.  She elects to transfer herself.   Hughie Closs, [place] 02/21/21 1655
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
## 2021-02-21 NOTE — Discharge Instructions (Addendum)
Take Tylenol, prescribe Robaxin as needed for your pain.  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  You will need follow-up and likely additional procedures to treat this.  If you develop any headache, vomiting, numbness, weakness, chest pain, abdominal pain, or other new concerning symptom, please come back to ER for reassessment.  Also follow-up with your primary care doctor.

## 2021-02-21 NOTE — ED Notes (Signed)
PT stepping outside to talk to daughter
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
## 2021-02-23 ENCOUNTER — Other Ambulatory Visit (HOSPITAL_COMMUNITY): Payer: Self-pay | Admitting: Neuroradiology

## 2021-02-23 DIAGNOSIS — I671 Cerebral aneurysm, nonruptured: Secondary | ICD-10-CM

## 2021-02-25 ENCOUNTER — Ambulatory Visit (HOSPITAL_COMMUNITY): Admission: RE | Admit: 2021-02-25 | Payer: No Typology Code available for payment source | Source: Ambulatory Visit

## 2021-03-08 ENCOUNTER — Other Ambulatory Visit: Payer: Self-pay

## 2021-03-08 ENCOUNTER — Ambulatory Visit (HOSPITAL_COMMUNITY)
Admission: RE | Admit: 2021-03-08 | Discharge: 2021-03-08 | Disposition: A | Discharge status: Home / Self Care | Payer: No Typology Code available for payment source | Source: Ambulatory Visit | Attending: Neuroradiology | Admitting: Neuroradiology

## 2021-03-08 DIAGNOSIS — I671 Cerebral aneurysm, nonruptured: Secondary | ICD-10-CM
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
## 2021-03-29 ENCOUNTER — Ambulatory Visit: Payer: Medicaid Other | Admitting: Podiatry

## 2021-05-02 ENCOUNTER — Other Ambulatory Visit: Payer: Self-pay

## 2021-05-02 ENCOUNTER — Emergency Department (HOSPITAL_COMMUNITY): Payer: Medicaid Other

## 2021-05-02 ENCOUNTER — Emergency Department (HOSPITAL_COMMUNITY)
Admission: EM | Admit: 2021-05-02 | Discharge: 2021-05-03 | Disposition: A | Discharge status: Home / Self Care | Payer: Medicaid Other | Attending: Medical | Admitting: Medical

## 2021-05-02 DIAGNOSIS — R0602 Shortness of breath: Secondary | ICD-10-CM | POA: Diagnosis not present

## 2021-05-02 DIAGNOSIS — Z5321 Procedure and treatment not carried out due to patient leaving prior to being seen by health care provider: Secondary | ICD-10-CM | POA: Diagnosis not present

## 2021-05-02 DIAGNOSIS — R059 Cough, unspecified: Secondary | ICD-10-CM | POA: Insufficient documentation

## 2021-05-02 DIAGNOSIS — R079 Chest pain, unspecified: Secondary | ICD-10-CM | POA: Insufficient documentation

## 2021-05-02 LAB — CBC WITH DIFFERENTIAL/PLATELET
Abs Immature Granulocytes: 0.02 10*3/uL (ref 0.00–0.07)
Basophils Absolute: 0 10*3/uL (ref 0.0–0.1)
Basophils Relative: 0 %
Eosinophils Absolute: 0 10*3/uL (ref 0.0–0.5)
Eosinophils Relative: 1 %
HCT: 42.2 % (ref 36.0–46.0)
Hemoglobin: 13.8 g/dL (ref 12.0–15.0)
Immature Granulocytes: 0 %
Lymphocytes Relative: 52 %
Lymphs Abs: 3.3 10*3/uL (ref 0.7–4.0)
MCH: 28 pg (ref 26.0–34.0)
MCHC: 32.7 g/dL (ref 30.0–36.0)
MCV: 85.8 fL (ref 80.0–100.0)
Monocytes Absolute: 0.4 10*3/uL (ref 0.1–1.0)
Monocytes Relative: 6 %
Neutro Abs: 2.6 10*3/uL (ref 1.7–7.7)
Neutrophils Relative %: 41 %
Platelets: 294 10*3/uL (ref 150–400)
RBC: 4.92 MIL/uL (ref 3.87–5.11)
RDW: 14.4 % (ref 11.5–15.5)
WBC: 6.3 10*3/uL (ref 4.0–10.5)
nRBC: 0 % (ref 0.0–0.2)

## 2021-05-02 LAB — D-DIMER, QUANTITATIVE: D-Dimer, Quant: 0.65 ug/mL-FEU — ABNORMAL HIGH (ref 0.00–0.50)
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
## 2021-05-02 NOTE — ED Notes (Signed)
Pt decided to leave 

## 2021-05-02 NOTE — ED Notes (Addendum)
Unable to get Troponin blood work

## 2021-05-02 NOTE — ED Triage Notes (Addendum)
Pt c/o shortness of breath and pain under her left breast. Pt also c/o coughing for the past 2 days.

## 2021-05-20 ENCOUNTER — Telehealth (HOSPITAL_COMMUNITY): Payer: Self-pay | Admitting: Radiology

## 2021-05-20 NOTE — Telephone Encounter (Signed)
Returned call to pt to schedule aneurysm treatment with Dr. Debbrah Alar. Left VM. Also called pt's PCP office to let them know we are trying to reach her. Would like to schedule her treatment for 06/03/21. JM

## 2021-05-21 ENCOUNTER — Other Ambulatory Visit (HOSPITAL_COMMUNITY): Payer: Self-pay | Admitting: Neuroradiology

## 2021-05-21 DIAGNOSIS — I671 Cerebral aneurysm, nonruptured: Secondary | ICD-10-CM

## 2021-05-25 ENCOUNTER — Other Ambulatory Visit: Payer: Self-pay | Admitting: Radiology

## 2021-05-27 ENCOUNTER — Ambulatory Visit (HOSPITAL_COMMUNITY)
Admission: RE | Admit: 2021-05-27 | Discharge: 2021-05-27 | Disposition: A | Discharge status: Home / Self Care | Payer: No Typology Code available for payment source | Source: Ambulatory Visit | Attending: Neuroradiology | Admitting: Neuroradiology

## 2021-05-27 ENCOUNTER — Other Ambulatory Visit: Payer: Self-pay

## 2021-05-27 DIAGNOSIS — I671 Cerebral aneurysm, nonruptured: Secondary | ICD-10-CM

## 2021-05-27 NOTE — Progress Notes (Signed)
INTERVENTIONAL NEURORADIOLOGY  Patient was scheduled to undergo endovascular treatment of the right MCA wide neck bifurcation aneurysm today.  She did not present for pre op at the scheduled time.  She said she thought the procedure would not be performed given that she did not obtain a COVID test the day before.  She requested a consult to clarify some concerns.  She was accompanied by her daughter.   She explained she that she has intermittent bilateral decrease in visual acuity, diplopia and nausea and is concerned this is related to the aneurysm.  I explained to her that her aneurysm is not related to or near her eyes, visual pathway or visual cortex and would not be the cause of these symptoms.  She was encouraged to discuss signs and symptoms with her primary care physician.    A second concern was related to the fact that a friend of hers who is a Marine scientist at [employer] told her that aneurysms cannot be fixed through the arm.  I explained to her that endovascular treatment of brain aneurysms can be successfully performed via femoral artery or radial artery approach and that these are standard endovascular accesses.    Lastly, she had concerns about procedure related risks and wanted to know if there is a 100% chance that she will be normal after the procedure.  I discussed risks and benefits of the procedure and emphasized that while the procedure risk is low, the risk is not 0 and that endovascular treatment of brain aneurysm carries a risk of ischemic and hemorrhagic stroke.  Overall procedure risk, including minor complications such as small access site hematoma is around 4-5%.  Risk of major complication is about 1%.   All questions were answered to their satisfaction.  She informed me that she would like to reschedule her endovascular intervention for treatment of a right MCA bifurcation aneurysm. Our scheduler will reach out to her to book her endovascular intervention.  Pedro Earls, MD Interventional Neuroradiology

## 2021-06-01 ENCOUNTER — Other Ambulatory Visit (HOSPITAL_COMMUNITY): Payer: Self-pay | Admitting: Physician Assistant

## 2021-06-01 ENCOUNTER — Other Ambulatory Visit (HOSPITAL_COMMUNITY): Payer: Self-pay | Admitting: Neuroradiology

## 2021-06-01 DIAGNOSIS — I671 Cerebral aneurysm, nonruptured: Secondary | ICD-10-CM

## 2021-06-01 MED ORDER — TICAGRELOR 90 MG PO TABS: 90.0000 mg | ORAL_TABLET | Freq: Two times a day (BID) | ORAL | 0 refills | Status: AC

## 2021-06-15 ENCOUNTER — Other Ambulatory Visit: Payer: Self-pay

## 2021-06-15 ENCOUNTER — Encounter (HOSPITAL_COMMUNITY): Payer: Self-pay | Admitting: *Deleted

## 2021-06-15 ENCOUNTER — Other Ambulatory Visit: Payer: Self-pay | Admitting: Radiology
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
## 2021-06-15 NOTE — Progress Notes (Addendum)
Ms Maria [name] denies chest pain or shortness of breath.  Patient denies having any s/s of Covid in her household.  Patient denies any known exposure to Covid.   Ms. Maria [name] reports having a cough for months, she was taking Bensoate without much relieve. PCP Dr. Estrellita Ludwig prescribed Phenergan cough medication twice a day, Ms Maria [name] reports that she rarely coughs now. Patient states that when she coughs it is a grayish color, denies fever. ` Ms Maria [name] stopped smoking 02/21/2021, after smoking 36 years.  Mrs. Maria [name] reports that she snores very loudly, and I stop breathing when I sleep , per her kids.  Mr. Maria [name] reported that my daughter watched me sleep last night, she said that I vomited while I slept, I don't know anything about it."  Ms Maria [name] states that she would take a blood transfusion if needed to save her life.  Ms, Maria [name] states that she has taken Brilinta ,2 times a day for the past 7 days.  Chart given to anesthesia PA-C to review.

## 2021-06-15 NOTE — Anesthesia Preprocedure Evaluation (Addendum)
Anesthesia Evaluation  Patient identified by MRN, date of birth, ID band Patient awake    Reviewed: Allergy & Precautions, NPO status , Patient's Chart, lab work & pertinent test results  Airway Mallampati: II  TM Distance: >3 FB Neck ROM: Full    Dental no notable dental hx.    Pulmonary former smoker, PE   Pulmonary exam normal breath sounds clear to auscultation       Cardiovascular hypertension, + DVT  Normal cardiovascular exam Rhythm:Regular Rate:Normal     Neuro/Psych 5 x 4 mm sccular aneurysm arising from right MCA bifurcation 02/21/21 negative psych ROS   GI/Hepatic negative GI ROS, Neg liver ROS,   Endo/Other  negative endocrine ROS  Renal/GU negative Renal ROS  negative genitourinary   Musculoskeletal negative musculoskeletal ROS (+)   Abdominal   Peds negative pediatric ROS (+)  Hematology negative hematology ROS (+)   Anesthesia Other Findings   Reproductive/Obstetrics negative OB ROS                            Anesthesia Physical Anesthesia Plan  ASA: 3  Anesthesia Plan: General   Post-op Pain Management:    Induction: Intravenous  PONV Risk Score and Plan: 3 and Ondansetron, Dexamethasone, Midazolam and Treatment may vary due to age or medical condition  Airway Management Planned: Oral ETT  Additional Equipment: Arterial line  Intra-op Plan:   Post-operative Plan: Extubation in OR  Informed Consent: I have reviewed the patients History and Physical, chart, labs and discussed the procedure including the risks, benefits and alternatives for the proposed anesthesia with the patient or authorized representative who has indicated his/her understanding and acceptance.     Dental advisory given  Plan Discussed with: CRNA and Surgeon  Anesthesia Plan Comments: (PAT note written 06/15/2021 by Myra Gianotti, PA-C. )       Anesthesia Quick Evaluation

## 2021-06-16 ENCOUNTER — Encounter (HOSPITAL_COMMUNITY): Payer: Self-pay

## 2021-06-16 ENCOUNTER — Encounter (HOSPITAL_COMMUNITY)
Admission: RE | Disposition: A | Discharge status: Home / Self Care | Payer: Self-pay | Source: Home / Self Care | Attending: Neuroradiology

## 2021-06-16 ENCOUNTER — Ambulatory Visit (HOSPITAL_COMMUNITY): Payer: Medicaid Other | Admitting: Vascular Surgery

## 2021-06-16 ENCOUNTER — Other Ambulatory Visit: Payer: Self-pay

## 2021-06-16 ENCOUNTER — Ambulatory Visit (HOSPITAL_COMMUNITY)
Admission: RE | Admit: 2021-06-16 | Discharge: 2021-06-16 | Disposition: A | Discharge status: Home / Self Care | Payer: Medicaid Other | Source: Ambulatory Visit | Attending: Neuroradiology | Admitting: Neuroradiology

## 2021-06-16 ENCOUNTER — Observation Stay (HOSPITAL_COMMUNITY)
Admission: RE | Admit: 2021-06-16 | Discharge: 2021-06-17 | Disposition: A | Discharge status: Home / Self Care | Payer: Medicaid Other | Attending: Neuroradiology | Admitting: Neuroradiology

## 2021-06-16 DIAGNOSIS — Z7982 Long term (current) use of aspirin: Secondary | ICD-10-CM | POA: Diagnosis not present

## 2021-06-16 DIAGNOSIS — Z86718 Personal history of other venous thrombosis and embolism: Secondary | ICD-10-CM | POA: Diagnosis not present

## 2021-06-16 DIAGNOSIS — G43909 Migraine, unspecified, not intractable, without status migrainosus: Secondary | ICD-10-CM | POA: Insufficient documentation

## 2021-06-16 DIAGNOSIS — I671 Cerebral aneurysm, nonruptured: Secondary | ICD-10-CM | POA: Insufficient documentation

## 2021-06-16 DIAGNOSIS — F419 Anxiety disorder, unspecified: Secondary | ICD-10-CM | POA: Insufficient documentation

## 2021-06-16 DIAGNOSIS — I10 Essential (primary) hypertension: Secondary | ICD-10-CM | POA: Insufficient documentation

## 2021-06-16 DIAGNOSIS — Z87891 Personal history of nicotine dependence: Secondary | ICD-10-CM | POA: Insufficient documentation

## 2021-06-16 DIAGNOSIS — Z7901 Long term (current) use of anticoagulants: Secondary | ICD-10-CM | POA: Insufficient documentation

## 2021-06-16 DIAGNOSIS — Z79899 Other long term (current) drug therapy: Secondary | ICD-10-CM | POA: Insufficient documentation

## 2021-06-16 DIAGNOSIS — M797 Fibromyalgia: Secondary | ICD-10-CM | POA: Insufficient documentation

## 2021-06-16 HISTORY — PX: IR INTRA CRAN STENT: IMG2345

## 2021-06-16 HISTORY — PX: IR TRANSCATH/EMBOLIZ: IMG695

## 2021-06-16 HISTORY — PX: IR CT HEAD LTD: IMG2386

## 2021-06-16 HISTORY — PX: IR US GUIDE VASC ACCESS RIGHT: IMG2390

## 2021-06-16 HISTORY — PX: IR ANGIO INTRA EXTRACRAN SEL INTERNAL CAROTID BILAT MOD SED: IMG5363

## 2021-06-16 HISTORY — PX: RADIOLOGY WITH ANESTHESIA: SHX6223

## 2021-06-16 HISTORY — DX: Cerebral aneurysm, nonruptured: I67.1

## 2021-06-16 HISTORY — PX: IR ANGIO VERTEBRAL SEL VERTEBRAL UNI R MOD SED: IMG5368

## 2021-06-16 LAB — CBC WITH DIFFERENTIAL/PLATELET
Abs Immature Granulocytes: 0.02 10*3/uL (ref 0.00–0.07)
Basophils Absolute: 0 10*3/uL (ref 0.0–0.1)
Basophils Relative: 1 %
Eosinophils Absolute: 0.2 10*3/uL (ref 0.0–0.5)
Eosinophils Relative: 2 %
HCT: 40.7 % (ref 36.0–46.0)
Hemoglobin: 12.7 g/dL (ref 12.0–15.0)
Immature Granulocytes: 0 %
Lymphocytes Relative: 29 %
Lymphs Abs: 2.3 10*3/uL (ref 0.7–4.0)
MCH: 27.5 pg (ref 26.0–34.0)
MCHC: 31.2 g/dL (ref 30.0–36.0)
MCV: 88.3 fL (ref 80.0–100.0)
Monocytes Absolute: 0.5 10*3/uL (ref 0.1–1.0)
Monocytes Relative: 6 %
Neutro Abs: 5.1 10*3/uL (ref 1.7–7.7)
Neutrophils Relative %: 62 %
Platelets: 413 10*3/uL — ABNORMAL HIGH (ref 150–400)
RBC: 4.61 MIL/uL (ref 3.87–5.11)
RDW: 14.5 % (ref 11.5–15.5)
WBC: 8.2 10*3/uL (ref 4.0–10.5)
nRBC: 0 % (ref 0.0–0.2)

## 2021-06-16 LAB — POCT ACTIVATED CLOTTING TIME
Activated Clotting Time: 149 seconds
Activated Clotting Time: 239 seconds
Activated Clotting Time: 257 seconds
Activated Clotting Time: 263 seconds
Activated Clotting Time: 257 seconds
Activated Clotting Time: 263 seconds

## 2021-06-16 LAB — POCT PREGNANCY, URINE: Preg Test, Ur: NEGATIVE

## 2021-06-16 LAB — BASIC METABOLIC PANEL
Anion gap: 7 (ref 5–15)
BUN: 6 mg/dL (ref 6–20)
CO2: 24 mmol/L (ref 22–32)
Calcium: 8.3 mg/dL — ABNORMAL LOW (ref 8.9–10.3)
Chloride: 108 mmol/L (ref 98–111)
Creatinine, Ser: 0.64 mg/dL (ref 0.44–1.00)
GFR, Estimated: >60 mL/min (ref >60)
Glucose, Bld: 102 mg/dL — ABNORMAL HIGH (ref 70–99)
Potassium: 3.7 mmol/L (ref 3.5–5.1)
Sodium: 139 mmol/L (ref 135–145)

## 2021-06-16 LAB — PROTIME-INR
INR: 0.9 (ref 0.8–1.2)
Prothrombin Time: 12.3 seconds (ref 11.4–15.2)

## 2021-06-16 LAB — CBC
HCT: 37.6 % (ref 36.0–46.0)
Hemoglobin: 12.3 g/dL (ref 12.0–15.0)
MCH: 28.3 pg (ref 26.0–34.0)
MCHC: 32.7 g/dL (ref 30.0–36.0)
MCV: 86.4 fL (ref 80.0–100.0)
Platelets: 375 10*3/uL (ref 150–400)
RBC: 4.35 MIL/uL (ref 3.87–5.11)
RDW: 14.6 % (ref 11.5–15.5)
WBC: 6.1 10*3/uL (ref 4.0–10.5)
nRBC: 0 % (ref 0.0–0.2)

## 2021-06-16 SURGERY — IR WITH ANESTHESIA
Anesthesia: General

## 2021-06-16 MED ORDER — HEPARIN SODIUM (PORCINE) 1000 UNIT/ML IJ SOLN
INTRAMUSCULAR | Status: AC
  Filled 2021-06-16: qty 10

## 2021-06-16 MED ORDER — TICAGRELOR 90 MG PO TABS
90.0000 mg | ORAL_TABLET | Freq: Two times a day (BID) | ORAL | Status: DC
  Administered 2021-06-16 – 2021-06-17 (×2): 90 mg via ORAL
  Filled 2021-06-16 (×2): qty 1

## 2021-06-16 MED ORDER — SODIUM CHLORIDE 0.9 % IV SOLN: INTRAVENOUS | Status: DC

## 2021-06-16 MED ORDER — CLEVIDIPINE BUTYRATE 0.5 MG/ML IV EMUL
INTRAVENOUS | Status: AC
  Filled 2021-06-16: qty 50

## 2021-06-16 MED ORDER — EPHEDRINE SULFATE-NACL 50-0.9 MG/10ML-% IV SOSY
PREFILLED_SYRINGE | INTRAVENOUS | Status: DC | PRN
  Administered 2021-06-16: 10 mg via INTRAVENOUS

## 2021-06-16 MED ORDER — ASPIRIN EC 325 MG PO TBEC
325.0000 mg | DELAYED_RELEASE_TABLET | ORAL | Status: DC
  Filled 2021-06-16: qty 1

## 2021-06-16 MED ORDER — ESCITALOPRAM OXALATE 10 MG PO TABS
10.0000 mg | ORAL_TABLET | Freq: Every day | ORAL | Status: DC
  Administered 2021-06-16: 22:00:00 10 mg via ORAL
  Filled 2021-06-16: qty 1

## 2021-06-16 MED ORDER — MIDAZOLAM HCL 2 MG/2ML IJ SOLN
INTRAMUSCULAR | Status: DC | PRN
  Administered 2021-06-16: 2 mg via INTRAVENOUS

## 2021-06-16 MED ORDER — CHLORHEXIDINE GLUCONATE CLOTH 2 % EX PADS
6.0000 | MEDICATED_PAD | Freq: Every day | CUTANEOUS | Status: DC
  Administered 2021-06-16: 22:00:00 6 via TOPICAL

## 2021-06-16 MED ORDER — FENTANYL CITRATE (PF) 100 MCG/2ML IJ SOLN
INTRAMUSCULAR | Status: AC
  Filled 2021-06-16: qty 2

## 2021-06-16 MED ORDER — EPTIFIBATIDE 75 MG/100ML IV SOLN: 1.0000 ug/kg/min | INTRAVENOUS | Status: DC

## 2021-06-16 MED ORDER — PHENYLEPHRINE HCL-NACL 20-0.9 MG/250ML-% IV SOLN
INTRAVENOUS | Status: DC | PRN
  Administered 2021-06-16: 50 ug/min via INTRAVENOUS

## 2021-06-16 MED ORDER — ASPIRIN 81 MG PO CHEW: 81.0000 mg | CHEWABLE_TABLET | Freq: Once | ORAL | Status: DC

## 2021-06-16 MED ORDER — FENTANYL CITRATE (PF) 100 MCG/2ML IJ SOLN: 25.0000 ug | INTRAMUSCULAR | Status: DC | PRN

## 2021-06-16 MED ORDER — EPTIFIBATIDE 20 MG/10ML IV SOLN
INTRAVENOUS | Status: DC | PRN
  Administered 2021-06-16 (×2): 3 mL via INTRAVENOUS

## 2021-06-16 MED ORDER — EPTIFIBATIDE 75 MG/100ML IV SOLN
1.0000 ug/kg/min | INTRAVENOUS | Status: AC
  Administered 2021-06-16: 13:00:00 1 ug/kg/min via INTRAVENOUS
  Filled 2021-06-16: qty 100

## 2021-06-16 MED ORDER — HEPARIN SODIUM (PORCINE) 1000 UNIT/ML IJ SOLN
INTRAMUSCULAR | Status: DC | PRN
  Administered 2021-06-16: 1000 [IU] via INTRAVENOUS

## 2021-06-16 MED ORDER — IOHEXOL 300 MG/ML  SOLN
100.0000 mL | Freq: Once | INTRAMUSCULAR | Status: AC | PRN
  Administered 2021-06-16: 11:00:00 80 mL via INTRA_ARTERIAL

## 2021-06-16 MED ORDER — FENTANYL CITRATE (PF) 250 MCG/5ML IJ SOLN
INTRAMUSCULAR | Status: DC | PRN
  Administered 2021-06-16 (×2): 50 ug via INTRAVENOUS

## 2021-06-16 MED ORDER — ROCURONIUM BROMIDE 10 MG/ML (PF) SYRINGE
PREFILLED_SYRINGE | INTRAVENOUS | Status: DC | PRN
  Administered 2021-06-16: 70 mg via INTRAVENOUS
  Administered 2021-06-16 (×2): 30 mg via INTRAVENOUS

## 2021-06-16 MED ORDER — EPTIFIBATIDE 20 MG/10ML IV SOLN
INTRAVENOUS | Status: AC
  Filled 2021-06-16: qty 10

## 2021-06-16 MED ORDER — NITROGLYCERIN 1 MG/10 ML FOR IR/CATH LAB
INTRA_ARTERIAL | Status: AC
  Filled 2021-06-16: qty 10

## 2021-06-16 MED ORDER — MIDAZOLAM HCL 2 MG/2ML IJ SOLN
INTRAMUSCULAR | Status: AC
  Filled 2021-06-16: qty 2

## 2021-06-16 MED ORDER — ONDANSETRON HCL 4 MG/2ML IJ SOLN: 4.0000 mg | Freq: Once | INTRAMUSCULAR | Status: DC | PRN

## 2021-06-16 MED ORDER — PREGABALIN 75 MG PO CAPS
150.0000 mg | ORAL_CAPSULE | Freq: Three times a day (TID) | ORAL | Status: DC
  Administered 2021-06-16 – 2021-06-17 (×2): 150 mg via ORAL
  Filled 2021-06-16 (×2): qty 2

## 2021-06-16 MED ORDER — ACETAMINOPHEN 10 MG/ML IV SOLN
INTRAVENOUS | Status: AC
  Filled 2021-06-16: qty 100

## 2021-06-16 MED ORDER — ACETAMINOPHEN 650 MG RE SUPP: 650.0000 mg | RECTAL | Status: DC | PRN

## 2021-06-16 MED ORDER — ASPIRIN 81 MG PO CHEW
81.0000 mg | CHEWABLE_TABLET | Freq: Every day | ORAL | Status: DC
  Administered 2021-06-17: 10:00:00 81 mg via ORAL
  Filled 2021-06-16: qty 1

## 2021-06-16 MED ORDER — ASPIRIN 81 MG PO CHEW: 81.0000 mg | CHEWABLE_TABLET | Freq: Every day | ORAL | Status: DC

## 2021-06-16 MED ORDER — DEXAMETHASONE SODIUM PHOSPHATE 10 MG/ML IJ SOLN
INTRAMUSCULAR | Status: DC | PRN
  Administered 2021-06-16: 10 mg via INTRAVENOUS

## 2021-06-16 MED ORDER — TICAGRELOR 90 MG PO TABS
90.0000 mg | ORAL_TABLET | ORAL | Status: DC
  Filled 2021-06-16: qty 1

## 2021-06-16 MED ORDER — CEFAZOLIN SODIUM-DEXTROSE 2-4 GM/100ML-% IV SOLN
2.0000 g | INTRAVENOUS | Status: AC
  Administered 2021-06-16: 10:00:00 2 g via INTRAVENOUS
  Filled 2021-06-16: qty 100

## 2021-06-16 MED ORDER — ALPRAZOLAM 0.5 MG PO TABS
2.0000 mg | ORAL_TABLET | Freq: Every day | ORAL | Status: DC | PRN
  Administered 2021-06-16: 22:00:00 2 mg via ORAL
  Filled 2021-06-16: qty 4

## 2021-06-16 MED ORDER — LIDOCAINE HCL 1 % IJ SOLN
INTRAMUSCULAR | Status: AC
  Filled 2021-06-16: qty 20

## 2021-06-16 MED ORDER — NITROGLYCERIN 1 MG/10 ML FOR IR/CATH LAB
INTRA_ARTERIAL | Status: DC | PRN
  Administered 2021-06-16: 200 ug via INTRA_ARTERIAL

## 2021-06-16 MED ORDER — ASPIRIN EC 81 MG PO TBEC
DELAYED_RELEASE_TABLET | ORAL | Status: AC
  Administered 2021-06-16: 09:00:00 81 mg
  Filled 2021-06-16: qty 1

## 2021-06-16 MED ORDER — ACETAMINOPHEN 160 MG/5ML PO SOLN: 650.0000 mg | ORAL | Status: DC | PRN

## 2021-06-16 MED ORDER — SUGAMMADEX SODIUM 200 MG/2ML IV SOLN
INTRAVENOUS | Status: DC | PRN
  Administered 2021-06-16: 400 mg via INTRAVENOUS

## 2021-06-16 MED ORDER — IOHEXOL 300 MG/ML  SOLN
100.0000 mL | Freq: Once | INTRAMUSCULAR | Status: AC | PRN
  Administered 2021-06-16: 13:00:00 60 mL via INTRA_ARTERIAL

## 2021-06-16 MED ORDER — EPTIFIBATIDE 75 MG/100ML IV SOLN
1.0000 ug/kg/min | INTRAVENOUS | Status: AC
  Filled 2021-06-16: qty 100

## 2021-06-16 MED ORDER — VERAPAMIL HCL 2.5 MG/ML IV SOLN
INTRAVENOUS | Status: DC | PRN
  Administered 2021-06-16: 5 mg via INTRA_ARTERIAL

## 2021-06-16 MED ORDER — ONDANSETRON HCL 4 MG/2ML IJ SOLN: 4.0000 mg | Freq: Four times a day (QID) | INTRAMUSCULAR | Status: DC | PRN

## 2021-06-16 MED ORDER — CHLORHEXIDINE GLUCONATE 0.12 % MT SOLN
OROMUCOSAL | Status: AC
  Administered 2021-06-16: 07:00:00 15 mL
  Filled 2021-06-16: qty 15

## 2021-06-16 MED ORDER — PROPOFOL 10 MG/ML IV BOLUS
INTRAVENOUS | Status: DC | PRN
  Administered 2021-06-16: 150 mg via INTRAVENOUS

## 2021-06-16 MED ORDER — TICAGRELOR 90 MG PO TABS: 90.0000 mg | ORAL_TABLET | Freq: Two times a day (BID) | ORAL | Status: DC

## 2021-06-16 MED ORDER — HEPARIN SODIUM (PORCINE) 1000 UNIT/ML IJ SOLN
INTRAMUSCULAR | Status: DC | PRN
  Administered 2021-06-16: 5000 [IU] via INTRAVENOUS

## 2021-06-16 MED ORDER — LIDOCAINE 2% (20 MG/ML) 5 ML SYRINGE
INTRAMUSCULAR | Status: DC | PRN
  Administered 2021-06-16: 100 mg via INTRAVENOUS

## 2021-06-16 MED ORDER — ACETAMINOPHEN 325 MG PO TABS: 650.0000 mg | ORAL_TABLET | ORAL | Status: DC | PRN

## 2021-06-16 MED ORDER — ONDANSETRON HCL 4 MG/2ML IJ SOLN
INTRAMUSCULAR | Status: DC | PRN
  Administered 2021-06-16: 4 mg via INTRAVENOUS

## 2021-06-16 MED ORDER — CLEVIDIPINE BUTYRATE 0.5 MG/ML IV EMUL
INTRAVENOUS | Status: DC | PRN
  Administered 2021-06-16: 4 mg/h via INTRAVENOUS

## 2021-06-16 MED ORDER — VERAPAMIL HCL 2.5 MG/ML IV SOLN
INTRAVENOUS | Status: AC
  Filled 2021-06-16: qty 2

## 2021-06-16 MED ORDER — LABETALOL HCL 5 MG/ML IV SOLN
INTRAVENOUS | Status: DC | PRN
  Administered 2021-06-16 (×2): 10 mg via INTRAVENOUS

## 2021-06-16 MED ORDER — CLEVIDIPINE BUTYRATE 0.5 MG/ML IV EMUL
0.0000 mg/h | INTRAVENOUS | Status: DC
  Administered 2021-06-16: 17:00:00 4 mg/h via INTRAVENOUS
  Filled 2021-06-16: qty 50

## 2021-06-16 MED ORDER — NIMODIPINE 30 MG PO CAPS
0.0000 mg | ORAL_CAPSULE | ORAL | Status: AC
  Administered 2021-06-16: 09:00:00 60 mg via ORAL
  Filled 2021-06-16: qty 2

## 2021-06-16 MED ORDER — IOHEXOL 300 MG/ML  SOLN
100.0000 mL | Freq: Once | INTRAMUSCULAR | Status: AC | PRN
  Administered 2021-06-16: 11:00:00 23 mL via INTRA_ARTERIAL

## 2021-06-16 MED ORDER — ACETAMINOPHEN 10 MG/ML IV SOLN
1000.0000 mg | Freq: Once | INTRAVENOUS | Status: DC | PRN
  Administered 2021-06-16: 14:00:00 1000 mg via INTRAVENOUS

## 2021-06-16 NOTE — Sedation Documentation (Addendum)
ACT 257

## 2021-06-16 NOTE — Sedation Documentation (Signed)
Stent deployed

## 2021-06-16 NOTE — Sedation Documentation (Signed)
ACT 239

## 2021-06-16 NOTE — Anesthesia Procedure Notes (Signed)
Procedure Name: Intubation Date/Time: 06/16/2021 9:30 AM Performed by: Lance Coon, CRNA Pre-anesthesia Checklist: Patient identified, Emergency Drugs available, Patient being monitored and Timeout performed Patient Re-evaluated:Patient Re-evaluated prior to induction Oxygen Delivery Method: Circle system utilized Preoxygenation: Pre-oxygenation with 100% oxygen Induction Type: IV induction Ventilation: Mask ventilation without difficulty Laryngoscope Size: Miller and 3 Grade View: Grade I Tube type: Oral Tube size: 7.0 mm Number of attempts: 1 Airway Equipment and Method: Stylet Placement Confirmation: ETT inserted through vocal cords under direct vision, positive ETCO2 and breath sounds checked- equal and bilateral Secured at: 21 cm Tube secured with: Tape Dental Injury: Teeth and Oropharynx as per pre-operative assessment

## 2021-06-16 NOTE — Progress Notes (Addendum)
Patient taken to PACU. Report given. Right wrist assessed, snuff box in place. Clean, dry and intact. No hematoma. Doppler pulse intact.  Neuro exam perform. Patient alert and oriented x4. Movement in all extremities, grip strength intact. Pupils intact.

## 2021-06-16 NOTE — Sedation Documentation (Signed)
ACT 263

## 2021-06-16 NOTE — Procedures (Signed)
INTERVENTIONAL NEURORADIOLOGY BRIEF POSTPROCEDURE NOTE  DIAGNOSTIC CEREBRAL ANGIOGRAM ENDOVASCULAR ANEURYSM EMBOLIZATION  Attending: Dr. Erven Colla de Sindy Messing  Assistant: None.  Diagnosis: Right MCA bifurcation aneurysm  Access site: Right distal radial artery, 61F  Access closure: Inflatable band  Anesthesia: GETA  Medication used: Integrilin IA bolus and IV drip; 6000 units of heparin, 200 mcg nitroglycerin, 5 mg verapamil.  Complications: Clot formation at the wed device interface, resolved with stenting and Integrilin administration.  Estimated blood loss: Negligible.  Specimen: None.  Findings: A 5.8 mm wide neck right MCA bifurcation aneurysm was seen. Embolization performed with a 5 x 3 mm WEB device. Post deployment angiogram showed clot formation at the parent artery-device interface resulting in mild narrowing of parent artery. No flow seen  within the recently deployed device. 6 mg of Integrilin was infused into the right MCA with modest improvement. Then, a 3 x 24 mm Neuroform atlas sent was deployed from the left M2 to M1/MCA, across the neck of the aneurysm. Significant improvement of the clot burden post stent deployment. Additional 6 mg of Integrilin were administered intra arterially.  No evidence of distal embolization. No bleed on post procedural flat panel CT.  The patient tolerated the procedure well without incident or complication and is in stable condition.    PLAN: - Radial band care per orders. - SBP 100-160 mmHg. - Integrilin drip until 7 am on 06/17/2021.

## 2021-06-16 NOTE — Procedures (Deleted)
  The note originally documented on this encounter has been moved the the encounter in which it belongs.  

## 2021-06-16 NOTE — Transfer of Care (Signed)
Immediate Anesthesia Transfer of Care Note  Patient: Maria [name]  Procedure(s) Performed: ANEURYSM  EMBOLIZATION  Patient Location: PACU  Anesthesia Type:General  Level of Consciousness: drowsy and patient cooperative  Airway & Oxygen Therapy: Patient Spontanous Breathing  Post-op Assessment: Report given to RN and Post -op Vital signs reviewed and stable  Post vital signs: Reviewed and stable  Last Vitals:  Vitals Value Taken Time  BP 94/68 06/16/21 1340  Temp    Pulse 65 06/16/21 1342  Resp 24 06/16/21 1342  SpO2 96 % 06/16/21 1342  Vitals shown include unvalidated device data.  Last Pain:  Vitals:   06/16/21 0647  TempSrc:   PainSc: 0-No pain         Complications: No notable events documented.

## 2021-06-16 NOTE — Sedation Documentation (Signed)
ACT 257

## 2021-06-16 NOTE — Sedation Documentation (Signed)
Baseline ACT 149

## 2021-06-16 NOTE — Anesthesia Postprocedure Evaluation (Signed)
Anesthesia Post Note  Patient: Maria [name]  Procedure(s) Performed: ANEURYSM  EMBOLIZATION     Patient location during evaluation: PACU Anesthesia Type: General Level of consciousness: awake and alert Pain management: pain level controlled Vital Signs Assessment: post-procedure vital signs reviewed and stable Respiratory status: spontaneous breathing, nonlabored ventilation, respiratory function stable and patient connected to nasal cannula oxygen Cardiovascular status: blood pressure returned to baseline and stable Postop Assessment: no apparent nausea or vomiting Anesthetic complications: no   No notable events documented.  Last Vitals:  Vitals:   06/16/21 1440 06/16/21 1455  BP: 129/86 133/71  Pulse: (!) 58 (!) 55  Resp: 19 13  Temp:    SpO2: 97% 96%    Last Pain:  Vitals:   06/16/21 1440  TempSrc:   PainSc: Asleep    LLE Motor Response: Purposeful movement (06/16/21 1455) LLE Sensation: Full sensation (06/16/21 1455) RLE Motor Response: Purposeful movement (06/16/21 1455) RLE Sensation: Full sensation (06/16/21 1455)      Thaily Hackworth S

## 2021-06-16 NOTE — Anesthesia Procedure Notes (Signed)
Arterial Line Insertion Start/End12/28/2022 7:45 AM, 06/16/2021 8:00 AM Performed by: Lance Coon, CRNA, CRNA  Preanesthetic checklist: patient identified, IV checked, site marked, risks and benefits discussed, surgical consent, monitors and equipment checked, pre-op evaluation, timeout performed and anesthesia consent Lidocaine 1% used for infiltration Left, radial was placed Catheter size: 20 G Hand hygiene performed  and maximum sterile barriers used   Attempts: 1 Procedure performed without using ultrasound guided technique. Following insertion, dressing applied and Biopatch. Post procedure assessment: normal and unchanged  Patient tolerated the procedure well with no immediate complications.
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
## 2021-06-17 ENCOUNTER — Encounter (HOSPITAL_COMMUNITY): Payer: Self-pay | Admitting: Neuroradiology

## 2021-06-17 DIAGNOSIS — I671 Cerebral aneurysm, nonruptured: Secondary | ICD-10-CM | POA: Diagnosis not present

## 2021-06-17 MED ORDER — ASPIRIN EC 81 MG PO TBEC: 81.0000 mg | DELAYED_RELEASE_TABLET | Freq: Every day | ORAL | 11 refills | Status: AC

## 2021-06-17 MED ORDER — IOHEXOL 300 MG/ML  SOLN
150.0000 mL | Freq: Once | INTRAMUSCULAR | Status: AC | PRN
  Administered 2021-06-17: 15:00:00 150 mL via INTRA_ARTERIAL

## 2021-06-17 NOTE — Progress Notes (Signed)
°  Transition of Care [facility]) Screening Note   Patient Details  Name: Maria [name] Date of Birth: 1966-05-09   Transition of Care [facility]) CM/SW Contact:    Benard Halsted, LCSW Phone Number: 06/17/2021, 11:10 AM    Transition of Care Department [facility]) has reviewed patient and no TOC needs have been identified at this time. We will continue to monitor patient advancement through interdisciplinary progression rounds. If new patient transition needs arise, please place a TOC consult.

## 2021-06-17 NOTE — Discharge Summary (Addendum)
Patient ID: Maria [name] MRN: [id number] DOB/AGE: 08/27/1965 55 y.o.  Admit date: 06/16/2021 Discharge date: 06/17/2021  Supervising Physician:  de Rosario Jacks    Patient Status: [facility] - In-pt  Admission Diagnoses: Right MCA bifurcation aneurysm  Discharge Diagnoses:  Principal Problem:   Brain aneurysm   Discharged Condition: good  Hospital Course:   Patient presented to [facility] for an cerebral angiogram with intervention with Dr. Karenann Cai on 06/16/21. Procedure occurred without major complications and patient was transferred to floor in stable condition (extubated w/o difficulty, VSS, R RA puncture site stable) for overnight observation. No major events occurred overnight.   Patient awake and alert, has no complaints at this time.  R RA puncture site stable.  Foley and A line removed this morning.   Patient was instructed to: - No lifting more than 10 pounds  with the right arm/hand for 210 days - Keep the right wrist puncture site clean and dry until fully heals, no submerging (sitting in a tub, swimming) for 7 days - Continue to take Brilinta 90 mg twice a day and Aspirin 81 mg once a day  - Follow up cerebral angiogram with Dr. Karenann Cai in 6 months      Consults: None  Significant Diagnostic Studies: none   Treatments: Right MCA bifurcation aneurysm with WEB device   Discharge Exam: Blood pressure (!) 143/72, pulse 66, temperature 99 F (37.2 C), resp. rate 20, height 5\' 5"  (1.651 m), weight 140 lb (63.5 kg), SpO2 96 %. Physical Exam Vitals reviewed.  Constitutional:      General: She is not in acute distress.    Appearance: Normal appearance. She is not ill-appearing.  HENT:     Head: Normocephalic and atraumatic.  Cardiovascular:     Rate and Rhythm: Normal rate and regular rhythm.     Comments: Right RP 2+ Pulmonary:     Effort: Pulmonary effort is normal.  Abdominal:     General: Abdomen is flat.     Palpations:  Abdomen is soft.  Skin:    General: Skin is warm and dry.     Coloration: Skin is not jaundiced or pale.     Comments: Positive dressing on R RA puncture site. Site is unremarkable with no erythema, edema, tenderness, bleeding or drainage. Dressing otherwise clean, dry, and intact.    Neurological:     Mental Status: She is alert and oriented to person, place, and time.  Psychiatric:        Mood and Affect: Mood normal.        Behavior: Behavior normal.    Disposition: Discharge disposition: 01-Home or Self Care       Discharge Instructions     Call MD for:   Complete by: As directed    Call MD for:  difficulty breathing, headache or visual disturbances   Complete by: As directed    Call MD for:  extreme fatigue   Complete by: As directed    Call MD for:  hives   Complete by: As directed    Call MD for:  persistant dizziness or light-headedness   Complete by: As directed    Call MD for:  persistant nausea and vomiting   Complete by: As directed    Call MD for:  redness, tenderness, or signs of infection (pain, swelling, redness, odor or green/yellow discharge around incision site)   Complete by: As directed    Call MD for:  severe uncontrolled  pain   Complete by: As directed    Call MD for:  temperature >100.4   Complete by: As directed    Diet - low sodium heart healthy   Complete by: As directed    Discharge wound care:   Complete by: As directed    Keep the right wrist puncture site clean and dry until it fully heals/closes. May keep a Band-aid until fully heals.   Increase activity slowly   Complete by: As directed    No lifting more than 10 pound with the right hand/arm for 10 days.      Allergies as of 06/17/2021       Reactions   Hydrocodone Nausea And Vomiting   N/V lasted a few days   Acyclovir And Related    Other    06/15/21- Ms Townshend said that she, " will take a blood transfusion if it is needed to save my life."        Medication List      STOP taking these medications    apixaban 2.5 MG Tabs tablet Commonly known as: Eliquis       TAKE these medications    alprazolam 2 MG tablet Commonly known as: XANAX Take 2 mg by mouth daily as needed for anxiety.   aspirin EC 81 MG tablet Take 1 tablet (81 mg total) by mouth daily. Swallow whole.   benzonatate 100 MG capsule Commonly known as: TESSALON Take 100-200 mg by mouth every 8 (eight) hours as needed for cough.   escitalopram 10 MG tablet Commonly known as: LEXAPRO Take 10 mg by mouth every evening.   hydrochlorothiazide 12.5 MG capsule Commonly known as: MICROZIDE Take 12.5 mg by mouth daily.   hydrOXYzine 25 MG tablet Commonly known as: ATARAX Take 25 mg by mouth in the morning and at bedtime.   methocarbamol 500 MG tablet Commonly known as: ROBAXIN Take 1 tablet (500 mg total) by mouth every 8 (eight) hours as needed for muscle spasms.   pregabalin 150 MG capsule Commonly known as: LYRICA Take 150 mg by mouth 3 (three) times daily.   ticagrelor 90 MG Tabs tablet Commonly known as: Brilinta Take 1 tablet (90 mg total) by mouth 2 (two) times daily.   tiZANidine 4 MG tablet Commonly known as: ZANAFLEX Take 4 mg by mouth 3 (three) times daily.   Vitamin D (Ergocalciferol) 1.25 MG (50000 UNIT) Caps capsule Commonly known as: DRISDOL Take 50,000 Units by mouth every Monday.               Discharge Care Instructions  (From admission, onward)           Start     Ordered   06/17/21 0000  Discharge wound care:       Comments: Keep the right wrist puncture site clean and dry until it fully heals/closes. May keep a Band-aid until fully heals.   06/17/21 1441            Follow-up Information     de Sindy Messing, Erven Colla, MD Follow up in 6 month(s).   Specialties: Radiology, Interventional Radiology Why: Follow up with Dr. Karenann Cai with cerebral angiogram in 6 months. Our scheduler will call you to set up the  appointment. Contact information: [address] [place] [address] [phone]                  Electronically Signed: Tera Mater, PA-C 06/17/2021, 2:42 PM   I  have spent Less Than 30 Minutes discharging Maria [name].

## 2021-06-17 NOTE — Progress Notes (Signed)
Pt d/c to home by car with family. Assessment stable. Pt able to void on her own and ambulate independently. D/C instructions reviewed. All questions answered.

## 2021-07-01 ENCOUNTER — Other Ambulatory Visit (HOSPITAL_COMMUNITY): Payer: Self-pay | Admitting: Neuroradiology

## 2021-07-01 DIAGNOSIS — I671 Cerebral aneurysm, nonruptured: Secondary | ICD-10-CM

## 2021-07-02 ENCOUNTER — Other Ambulatory Visit: Payer: Self-pay | Admitting: Student

## 2021-07-02 ENCOUNTER — Other Ambulatory Visit: Payer: Self-pay

## 2021-07-02 ENCOUNTER — Ambulatory Visit (HOSPITAL_COMMUNITY)
Admission: RE | Admit: 2021-07-02 | Discharge: 2021-07-02 | Disposition: A | Discharge status: Home / Self Care | Payer: Medicaid Other | Source: Ambulatory Visit | Attending: Neuroradiology | Admitting: Neuroradiology

## 2021-07-02 DIAGNOSIS — I671 Cerebral aneurysm, nonruptured: Secondary | ICD-10-CM
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
## 2021-09-24 ENCOUNTER — Telehealth (HOSPITAL_COMMUNITY): Payer: Self-pay | Admitting: Physician Assistant

## 2021-09-24 MED ORDER — TICAGRELOR 90 MG PO TABS: 90.0000 mg | ORAL_TABLET | Freq: Two times a day (BID) | ORAL | 1 refills | Status: DC

## 2021-09-24 NOTE — Progress Notes (Signed)
Patient ID: Maria [name], female   DOB: 01/13/1966, 56 y.o.   MRN: [id number] ? ? ?Spoke with Maryclare Bean re: [name] medications this afternoon 09/24/21 who confirms patient has d/c Eliquis and is taking Brilinta '90mg'$  BID.  Refill request received.  Rx is e-prescribed to [facility] for 90days with 1 refill.   ? ?Electronically Signed: ?Pasty Spillers, PA ?09/24/2021, 3:36 PM ? ? ?

## 2021-10-20 ENCOUNTER — Other Ambulatory Visit: Payer: Self-pay | Admitting: Internal Medicine

## 2021-10-20 LAB — TIQ- AMBIGUOUS ORDER

## 2021-10-22 LAB — LIPID PANEL
Cholesterol: 150 mg/dL (ref <200)
HDL: 78 mg/dL (ref >=50)
LDL Cholesterol (Calc): 59 mg/dL (calc)
Non-HDL Cholesterol (Calc): 72 mg/dL (calc) (ref <130)
Total CHOL/HDL Ratio: 1.9 (calc) (ref <5.0)
Triglycerides: 58 mg/dL (ref <150)

## 2021-10-22 LAB — COMPLETE METABOLIC PANEL WITH GFR
AG Ratio: 1.1 (calc) (ref 1.0–2.5)
ALT: 10 U/L (ref 6–29)
AST: 14 U/L (ref 10–35)
Albumin: 3.9 g/dL (ref 3.6–5.1)
Alkaline phosphatase (APISO): 90 U/L (ref 37–153)
BUN: 11 mg/dL (ref 7–25)
CO2: 21 mmol/L (ref 20–32)
Calcium: 9.3 mg/dL (ref 8.6–10.4)
Chloride: 103 mmol/L (ref 98–110)
Creat: 0.6 mg/dL (ref 0.50–1.03)
Globulin: 3.4 g/dL (calc) (ref 1.9–3.7)
Glucose, Bld: 87 mg/dL (ref 65–99)
Potassium: 4.6 mmol/L (ref 3.5–5.3)
Sodium: 140 mmol/L (ref 135–146)
Total Bilirubin: 0.4 mg/dL (ref 0.2–1.2)
Total Protein: 7.3 g/dL (ref 6.1–8.1)
eGFR: 106 mL/min/{1.73_m2} (ref >=60)

## 2021-10-22 LAB — VITAMIN D 25 HYDROXY (VIT D DEFICIENCY, FRACTURES): Vit D, 25-Hydroxy: 21 ng/mL — ABNORMAL LOW (ref 30–100)

## 2021-10-22 LAB — CBC
HCT: 41.3 % (ref 35.0–45.0)
Hemoglobin: 13.2 g/dL (ref 11.7–15.5)
MCH: 27.3 pg (ref 27.0–33.0)
MCHC: 32 g/dL (ref 32.0–36.0)
MCV: 85.3 fL (ref 80.0–100.0)
MPV: 9.6 fL (ref 7.5–12.5)
Platelets: 453 10*3/uL — ABNORMAL HIGH (ref 140–400)
RBC: 4.84 10*6/uL (ref 3.80–5.10)
RDW: 12.1 % (ref 11.0–15.0)
WBC: 5 10*3/uL (ref 3.8–10.8)

## 2021-10-22 LAB — RHEUMATOID FACTOR: Rheumatoid fact SerPl-aCnc: <14 IU/mL (ref <14)

## 2021-10-22 LAB — FOLATE: Folate: 12.2 ng/mL

## 2021-10-22 LAB — SEDIMENTATION RATE: Sed Rate: 6 mm/h (ref 0–30)

## 2021-10-22 LAB — ANA: Anti Nuclear Antibody (ANA): NEGATIVE

## 2021-10-22 LAB — URIC ACID: Uric Acid, Serum: 3.8 mg/dL (ref 2.5–7.0)

## 2021-10-22 LAB — TSH: TSH: 1.18 mIU/L

## 2021-10-22 LAB — VITAMIN B12: Vitamin B-12: 361 pg/mL (ref 200–1100)

## 2022-03-13 IMAGING — DX DG CHEST 2V
2 series · 2 of 2 positions shown · non-contrast
Comparison: August 12, 2019

CLINICAL DATA: Chest pain

EXAM:
CHEST - 2 VIEW

[chest pa]
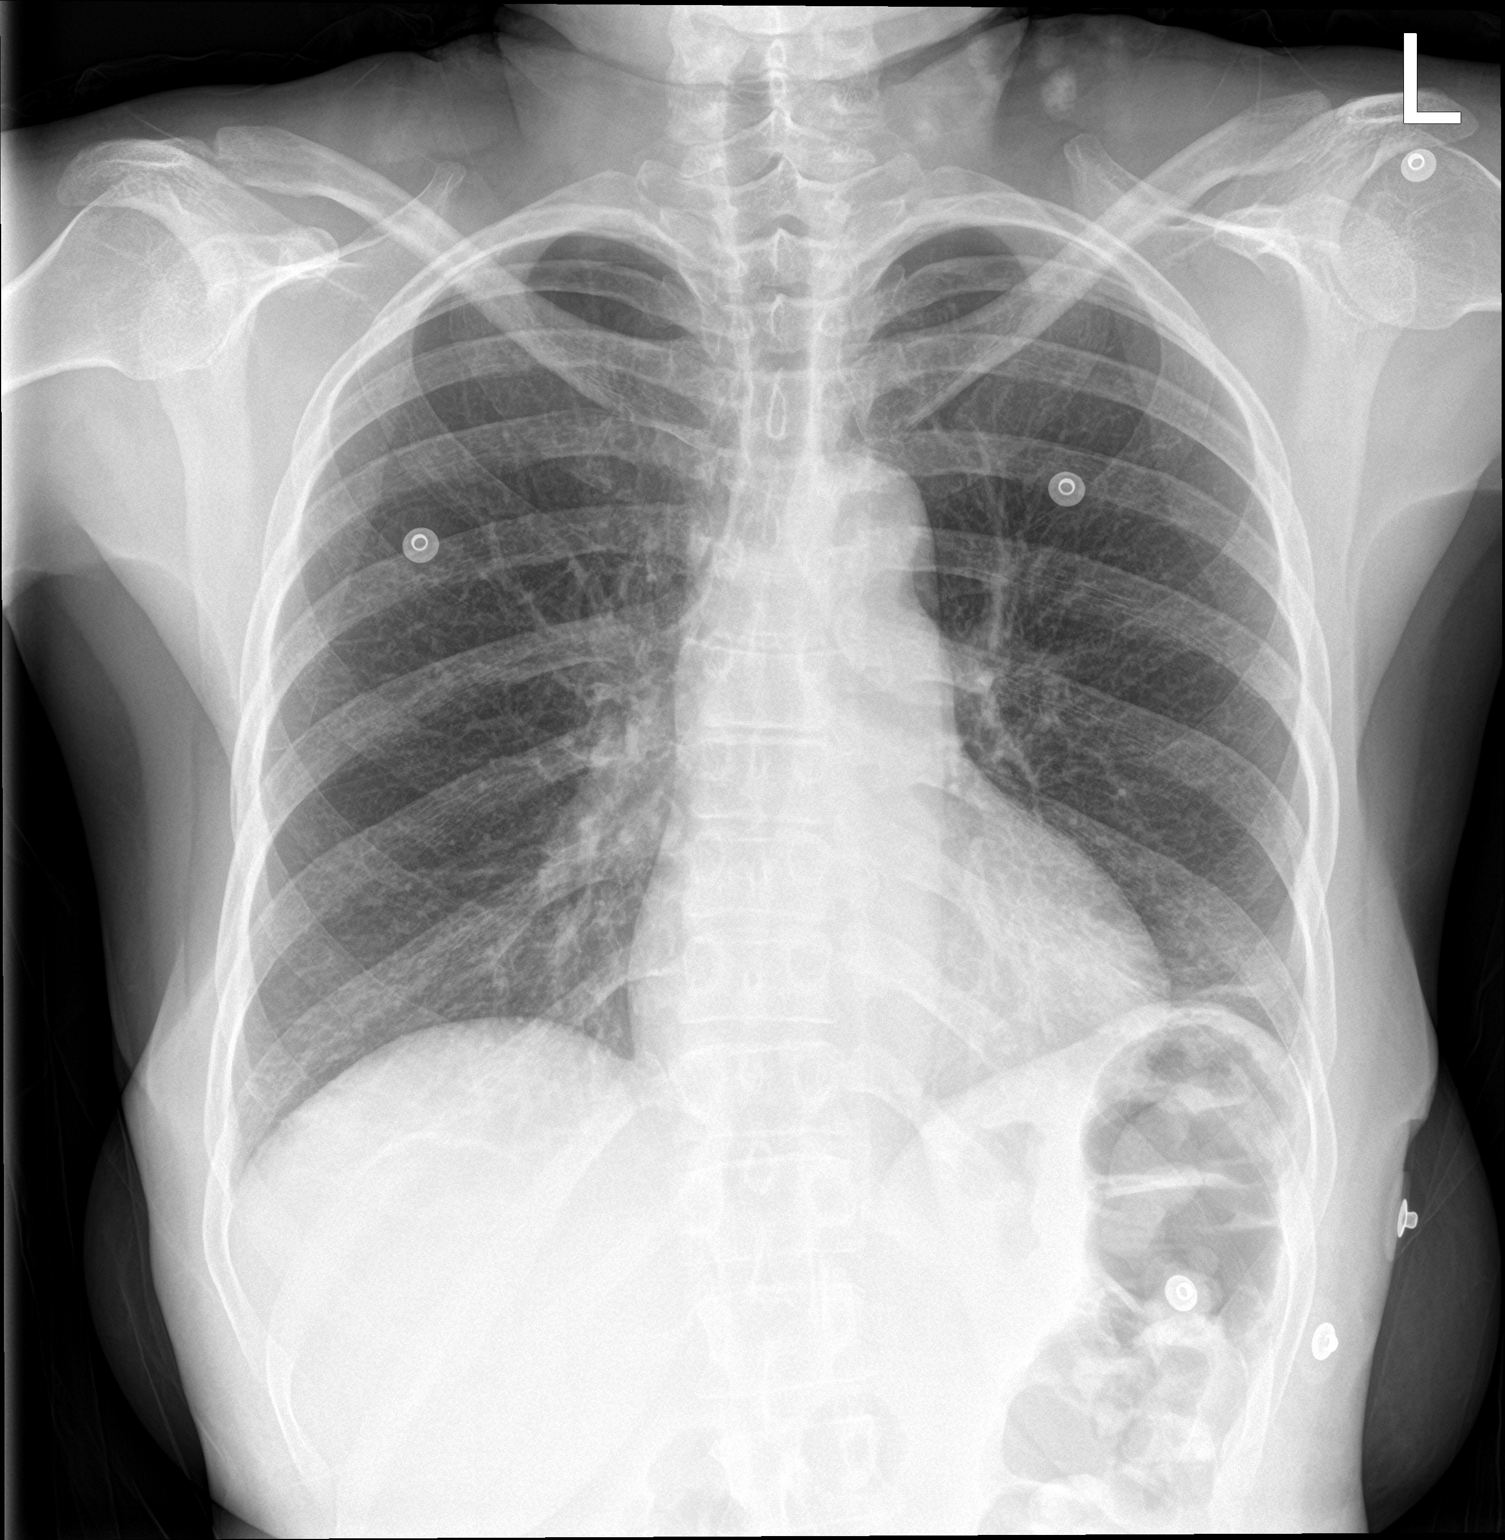

[chest lat]
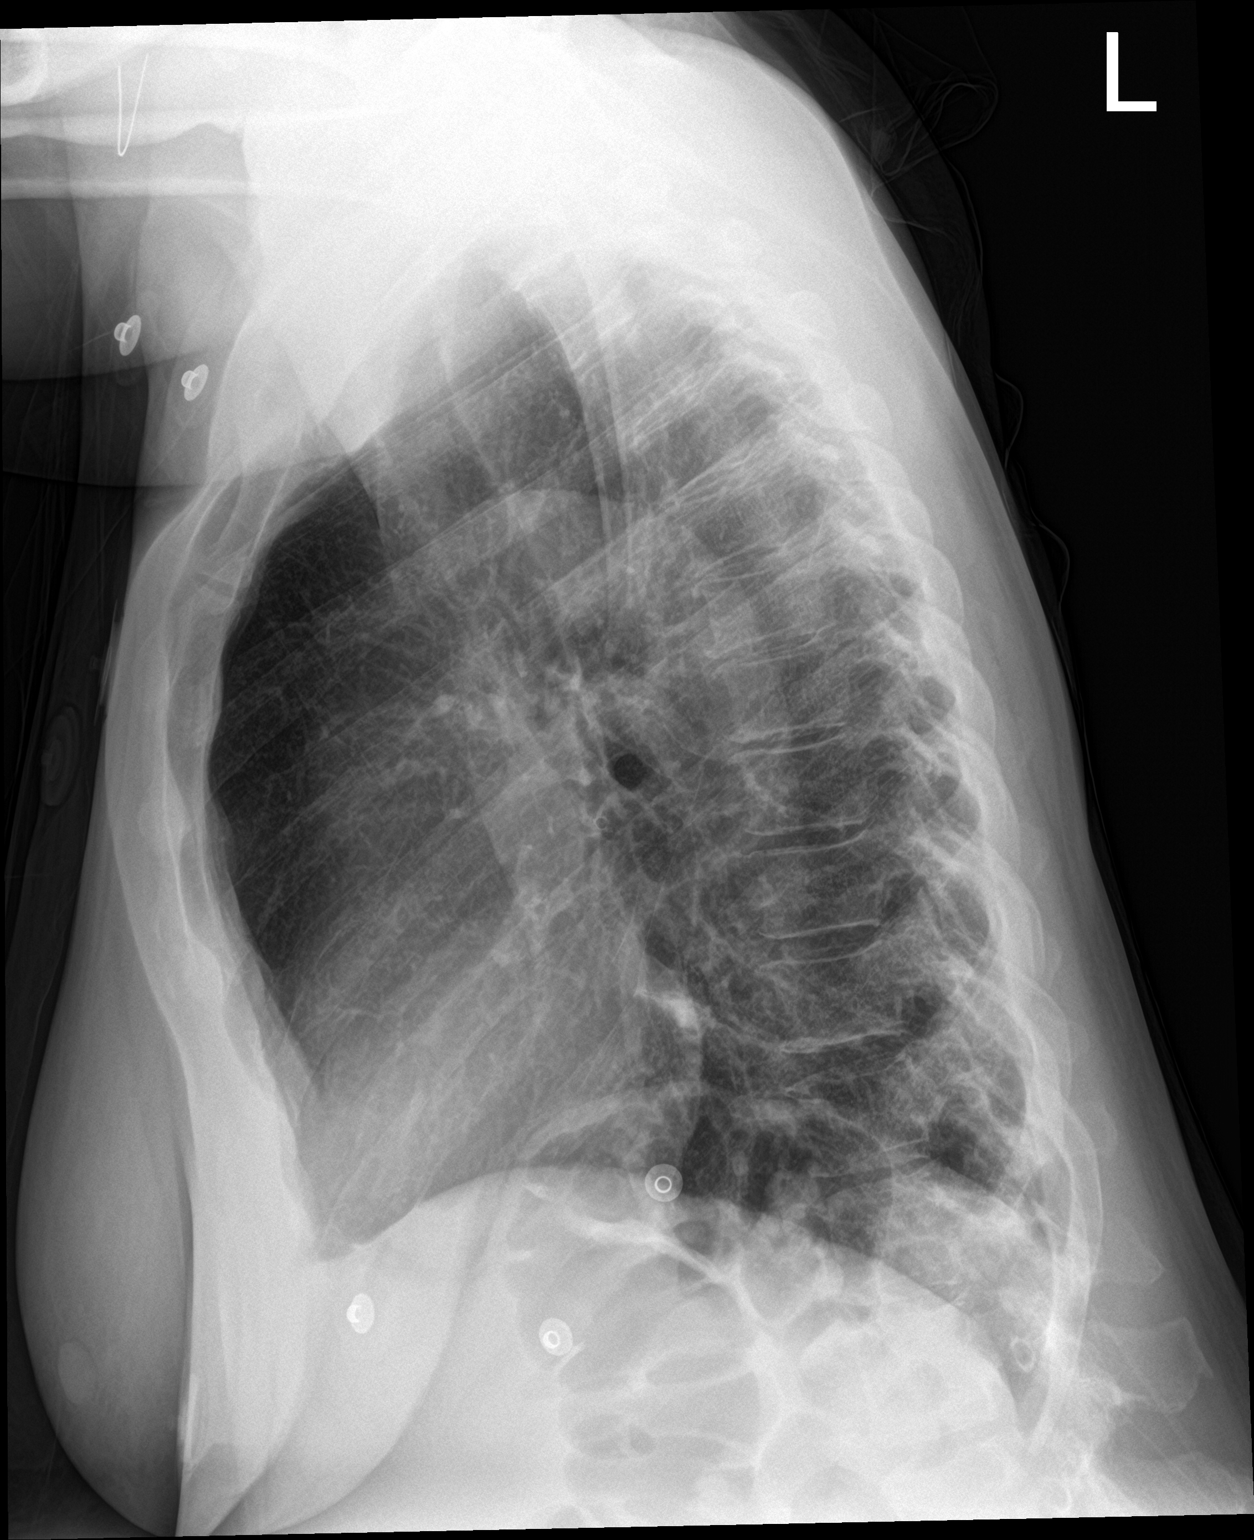

[2 of 2 positions shown; findings below may reference images not displayed]

FINDINGS: The heart size and mediastinal contours are within normal limits.
Similar eventration of left hemidiaphragm. No focal consolidation.
No pleural effusion. No pneumothorax. The visualized skeletal
structures are unremarkable.
IMPRESSION: No active cardiopulmonary disease.

## 2022-03-18 ENCOUNTER — Other Ambulatory Visit (HOSPITAL_COMMUNITY): Payer: Self-pay | Admitting: Physician Assistant

## 2022-03-21 ENCOUNTER — Telehealth (HOSPITAL_COMMUNITY): Payer: Self-pay

## 2022-03-21 ENCOUNTER — Other Ambulatory Visit (HOSPITAL_COMMUNITY): Payer: Self-pay | Admitting: Neuroradiology

## 2022-03-21 DIAGNOSIS — I671 Cerebral aneurysm, nonruptured: Secondary | ICD-10-CM

## 2022-03-21 NOTE — Telephone Encounter (Signed)
Called to schedule f/u, no answer, left vm. AW 

## 2022-04-15 ENCOUNTER — Other Ambulatory Visit: Payer: Self-pay | Admitting: Internal Medicine

## 2022-04-15 MED ORDER — TICAGRELOR 90 MG PO TABS: 90.0000 mg | ORAL_TABLET | Freq: Two times a day (BID) | ORAL | 1 refills | Status: AC

## 2022-04-15 NOTE — Progress Notes (Signed)
Request received via fax from [facility] to refill pt Brilinta rx. Brilinta 90 mg BID was electronically sent to pharmacy.     Narda Rutherford, AGNP-BC 04/15/2022, 2:20 PM

## 2022-07-20 ENCOUNTER — Telehealth (HOSPITAL_COMMUNITY): Payer: Self-pay

## 2022-07-20 NOTE — Telephone Encounter (Signed)
Called to schedule f/u, no answer, no vm. AB

## 2022-09-08 IMAGING — CR DG CHEST 2V
2 series · 2 of 2 positions shown · non-contrast
Comparison: 11/04/2020 radiograph and CT

CLINICAL DATA: Chest pain.

EXAM:
CHEST - 2 VIEW

[chest pa]
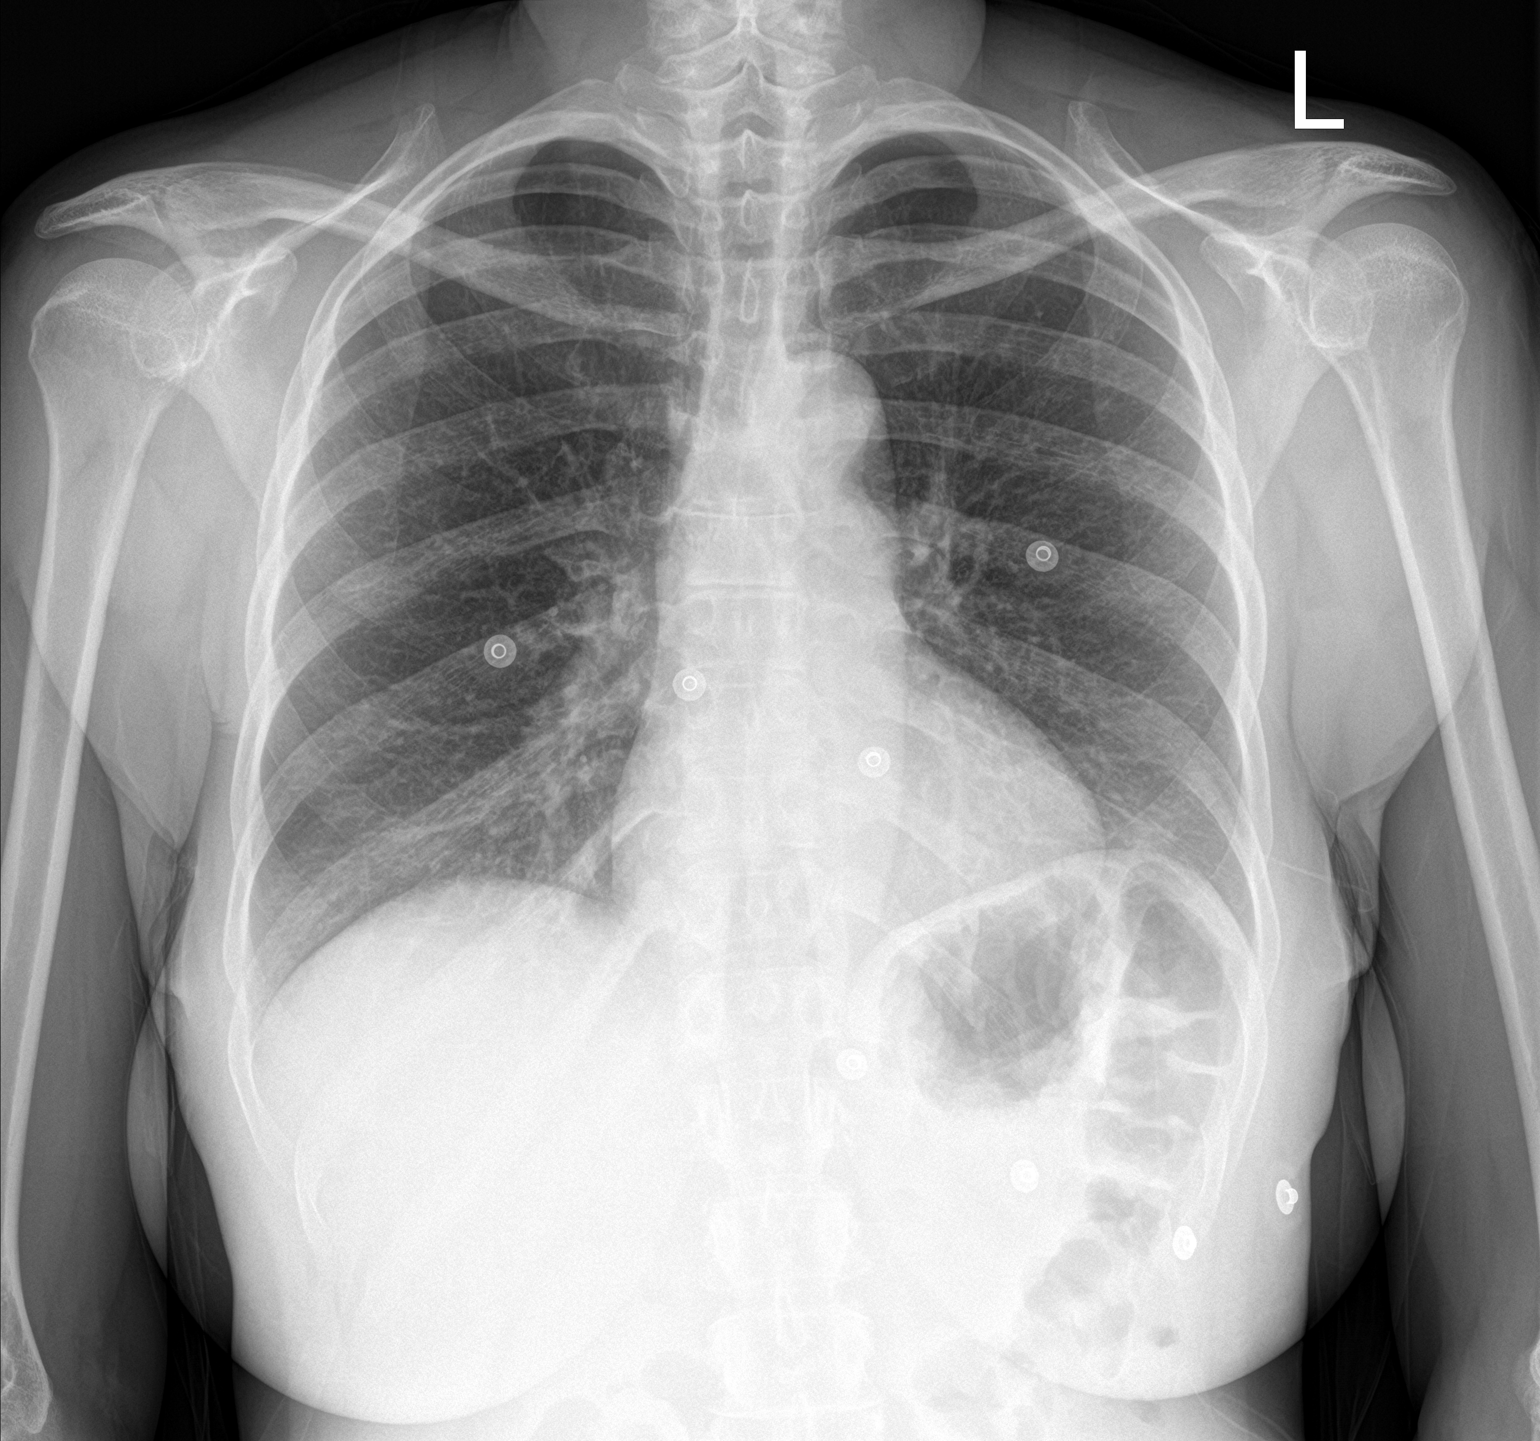

[chest lat]
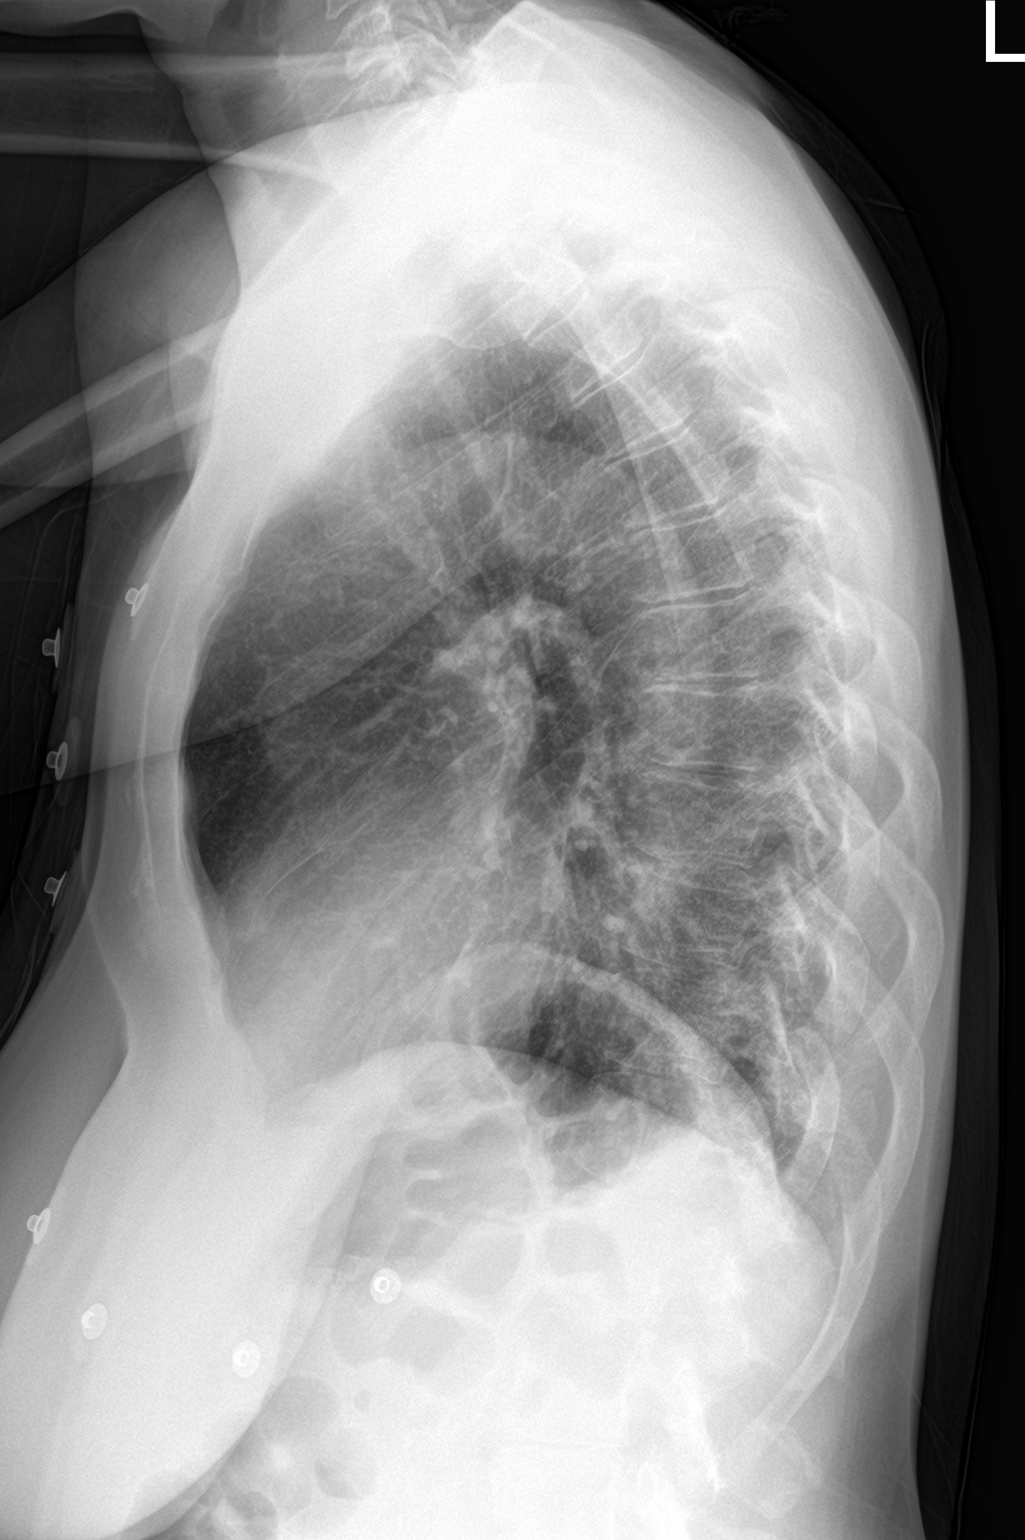

[2 of 2 positions shown; findings below may reference images not displayed]

FINDINGS: The cardiomediastinal contours are normal. Mild chronic eventration
of left hemidiaphragm. Pulmonary vasculature is normal. No
consolidation, pleural effusion, or pneumothorax. No acute osseous
abnormalities are seen.
IMPRESSION: No acute chest findings.

## 2022-10-28 ENCOUNTER — Encounter (HOSPITAL_COMMUNITY): Payer: Self-pay

## 2022-10-28 ENCOUNTER — Emergency Department (HOSPITAL_COMMUNITY): Payer: Medicaid Other

## 2022-10-28 ENCOUNTER — Other Ambulatory Visit: Payer: Self-pay

## 2022-10-28 ENCOUNTER — Emergency Department (HOSPITAL_COMMUNITY)
Admission: EM | Admit: 2022-10-28 | Discharge: 2022-10-28 | Disposition: A | Discharge status: Home / Self Care | Payer: Medicaid Other | Attending: Emergency Medicine | Admitting: Emergency Medicine

## 2022-10-28 DIAGNOSIS — I10 Essential (primary) hypertension: Secondary | ICD-10-CM | POA: Insufficient documentation

## 2022-10-28 DIAGNOSIS — R079 Chest pain, unspecified: Secondary | ICD-10-CM | POA: Diagnosis present

## 2022-10-28 DIAGNOSIS — S20211A Contusion of right front wall of thorax, initial encounter: Secondary | ICD-10-CM | POA: Diagnosis not present

## 2022-10-28 DIAGNOSIS — Z79899 Other long term (current) drug therapy: Secondary | ICD-10-CM | POA: Insufficient documentation

## 2022-10-28 DIAGNOSIS — Z7982 Long term (current) use of aspirin: Secondary | ICD-10-CM | POA: Insufficient documentation

## 2022-10-28 DIAGNOSIS — Y92002 Bathroom of unspecified non-institutional (private) residence single-family (private) house as the place of occurrence of the external cause: Secondary | ICD-10-CM | POA: Diagnosis not present

## 2022-10-28 DIAGNOSIS — W01198A Fall on same level from slipping, tripping and stumbling with subsequent striking against other object, initial encounter: Secondary | ICD-10-CM | POA: Insufficient documentation

## 2022-10-28 DIAGNOSIS — W19XXXA Unspecified fall, initial encounter: Secondary | ICD-10-CM

## 2022-10-28 LAB — CBC WITH DIFFERENTIAL/PLATELET
Abs Immature Granulocytes: 0.01 10*3/uL (ref 0.00–0.07)
Basophils Absolute: 0 10*3/uL (ref 0.0–0.1)
Basophils Relative: 1 %
Eosinophils Absolute: 0.3 10*3/uL (ref 0.0–0.5)
Eosinophils Relative: 6 %
HCT: 38 % (ref 36.0–46.0)
Hemoglobin: 11.5 g/dL — ABNORMAL LOW (ref 12.0–15.0)
Immature Granulocytes: 0 %
Lymphocytes Relative: 42 %
Lymphs Abs: 2.1 10*3/uL (ref 0.7–4.0)
MCH: 27 pg (ref 26.0–34.0)
MCHC: 30.3 g/dL (ref 30.0–36.0)
MCV: 89.2 fL (ref 80.0–100.0)
Monocytes Absolute: 0.3 10*3/uL (ref 0.1–1.0)
Monocytes Relative: 6 %
Neutro Abs: 2.2 10*3/uL (ref 1.7–7.7)
Neutrophils Relative %: 45 %
Platelets: 233 10*3/uL (ref 150–400)
RBC: 4.26 MIL/uL (ref 3.87–5.11)
RDW: 14.2 % (ref 11.5–15.5)
WBC: 4.9 10*3/uL (ref 4.0–10.5)
nRBC: 0 % (ref 0.0–0.2)

## 2022-10-28 LAB — BASIC METABOLIC PANEL
Anion gap: 11 (ref 5–15)
BUN: 12 mg/dL (ref 6–20)
CO2: 21 mmol/L — ABNORMAL LOW (ref 22–32)
Calcium: 8.5 mg/dL — ABNORMAL LOW (ref 8.9–10.3)
Chloride: 105 mmol/L (ref 98–111)
Creatinine, Ser: 0.66 mg/dL (ref 0.44–1.00)
GFR, Estimated: >60 mL/min (ref >60)
Glucose, Bld: 75 mg/dL (ref 70–99)
Potassium: 4.4 mmol/L (ref 3.5–5.1)
Sodium: 137 mmol/L (ref 135–145)

## 2022-10-28 MED ORDER — ONDANSETRON HCL 4 MG/2ML IJ SOLN
4.0000 mg | Freq: Once | INTRAMUSCULAR | Status: AC
  Administered 2022-10-28: 4 mg via INTRAVENOUS
  Filled 2022-10-28: qty 2

## 2022-10-28 MED ORDER — ONDANSETRON 4 MG PO TBDP: 4.0000 mg | ORAL_TABLET | Freq: Three times a day (TID) | ORAL | 0 refills | Status: DC | PRN

## 2022-10-28 MED ORDER — OXYCODONE-ACETAMINOPHEN 5-325 MG PO TABS: 1.0000 | ORAL_TABLET | Freq: Four times a day (QID) | ORAL | 0 refills | Status: AC | PRN

## 2022-10-28 MED ORDER — MORPHINE SULFATE (PF) 4 MG/ML IV SOLN
4.0000 mg | Freq: Once | INTRAVENOUS | Status: AC
  Administered 2022-10-28: 4 mg via INTRAVENOUS
  Filled 2022-10-28: qty 1

## 2022-10-28 NOTE — ED Provider Notes (Signed)
EMERGENCY DEPARTMENT AT [facility] Provider Note   CSN: [id number] Arrival date & time: 10/28/22  1724     History  Chief Complaint  Patient presents with   Marletta Lor    Maria [name] is a 57 y.o. female.  Pt is a 57 yo female with pmhx significant for PE/DVT (not on thinners), cerebral aneurysm, fibromyalgia, chronic pain, htn, and migraines.  Pt said she fell today in the bathroom and hit the side of the tub.  She has pain in her right breast and chest.  She denies hitting her head.  No loc.       Home Medications Prior to Admission medications   Medication Sig Start Date End Date Taking? Authorizing Provider  ondansetron (ZOFRAN-ODT) 4 MG disintegrating tablet Take 1 tablet (4 mg total) by mouth every 8 (eight) hours as needed for nausea or vomiting. 10/28/22  Yes Jacalyn Lefevre, MD  oxyCODONE-acetaminophen (PERCOCET/ROXICET) 5-325 MG tablet Take 1 tablet by mouth every 6 (six) hours as needed for severe pain. 10/28/22  Yes Jacalyn Lefevre, MD  alprazolam Prudy Feeler) 2 MG tablet Take 2 mg by mouth daily as needed for anxiety. 12/17/20   [provider]  aspirin EC 81 MG tablet Take 1 tablet (81 mg total) by mouth daily. Swallow whole. 06/17/21   Han, Aimee H, PA-C  benzonatate (TESSALON) 100 MG capsule Take 100-200 mg by mouth every 8 (eight) hours as needed for cough. 05/24/21   [provider]  escitalopram (LEXAPRO) 10 MG tablet Take 10 mg by mouth every evening. 08/28/20   [provider]  hydrochlorothiazide (MICROZIDE) 12.5 MG capsule Take 12.5 mg by mouth daily. 11/24/20   [provider]  hydrOXYzine (ATARAX/VISTARIL) 25 MG tablet Take 25 mg by mouth in the morning and at bedtime. 08/28/20   [provider]  methocarbamol (ROBAXIN) 500 MG tablet Take 1 tablet (500 mg total) by mouth every 8 (eight) hours as needed for muscle spasms. 02/21/21   Milagros Loll, MD  pregabalin (LYRICA) 150 MG capsule Take 150 mg by mouth  3 (three) times daily. 12/18/20   [provider]  tiZANidine (ZANAFLEX) 4 MG tablet Take 4 mg by mouth 3 (three) times daily. 08/28/20   [provider]  Vitamin D, Ergocalciferol, (DRISDOL) 1.25 MG (50000 UNIT) CAPS capsule Take 50,000 Units by mouth every Monday. 08/02/19   [provider]      Allergies    Hydrocodone, Acyclovir and related, and Other    Review of Systems   Review of Systems  Cardiovascular:  Positive for chest pain.  All other systems reviewed and are negative.   Physical Exam Updated Vital Signs BP (!) 162/82   Pulse (!) 59   Temp 99 F (37.2 C)   Resp 10   Ht 5\' 3"  (1.6 m)   Wt 68 kg   SpO2 98%   BMI 26.57 kg/m  Physical Exam Vitals and nursing note reviewed.  Constitutional:      Appearance: Normal appearance. She is obese.  HENT:     Head: Normocephalic and atraumatic.     Right Ear: External ear normal.     Left Ear: External ear normal.     Nose: Nose normal.     Mouth/Throat:     Mouth: Mucous membranes are moist.     Pharynx: Oropharynx is clear.  Eyes:     Extraocular Movements: Extraocular movements intact.     Conjunctiva/sclera: Conjunctivae normal.  Pupils: Pupils are equal, round, and reactive to light.  Cardiovascular:     Rate and Rhythm: Normal rate and regular rhythm.     Pulses: Normal pulses.     Heart sounds: Normal heart sounds.  Pulmonary:     Effort: Pulmonary effort is normal.     Breath sounds: Normal breath sounds.  Chest:     Comments: No signs of trauma to chest/breast.  Tenderness to palpation to right breast. Abdominal:     General: Abdomen is flat. Bowel sounds are normal.     Palpations: Abdomen is soft.  Musculoskeletal:        General: Normal range of motion.     Cervical back: Normal range of motion and neck supple.  Skin:    General: Skin is warm.     Capillary Refill: Capillary refill takes less than 2 seconds.  Neurological:     General: No focal deficit present.      Mental Status: She is alert and oriented to person, place, and time.  Psychiatric:        Mood and Affect: Mood normal.        Behavior: Behavior normal.     ED Results / Procedures / Treatments   Labs (all labs ordered are listed, but only abnormal results are displayed) Labs Reviewed  BASIC METABOLIC PANEL - Abnormal; Notable for the following components:      Result Value   CO2 21 (*)    Calcium 8.5 (*)    All other components within normal limits  CBC WITH DIFFERENTIAL/PLATELET - Abnormal; Notable for the following components:   Hemoglobin 11.5 (*)    All other components within normal limits  URINALYSIS, ROUTINE W REFLEX MICROSCOPIC    EKG None  Radiology DG Chest 2 View  Result Date: 10/28/2022 CLINICAL DATA:  Chest injury EXAM: CHEST - 2 VIEW COMPARISON:  05/02/2021 FINDINGS: Low lung volumes. No acute airspace disease. Stable cardiomediastinal silhouette with aortic atherosclerosis. No pneumothorax. IMPRESSION: No active cardiopulmonary disease. Low lung volumes. Electronically Signed   By: Jasmine Pang M.D.   On: 10/28/2022 20:15    Procedures Procedures    Medications Ordered in ED Medications  morphine (PF) 4 MG/ML injection 4 mg (4 mg Intravenous Given 10/28/22 1937)  ondansetron (ZOFRAN) injection 4 mg (4 mg Intravenous Given 10/28/22 1937)    ED Course/ Medical Decision Making/ A&P                             Medical Decision Making Amount and/or Complexity of Data Reviewed Labs: ordered. Radiology: ordered.  Risk Prescription drug management.   This patient presents to the ED for concern of fall, this involves an extensive number of treatment options, and is a complaint that carries with it a high risk of complications and morbidity.  The differential diagnosis includes multiple trauma   Co morbidities that complicate the patient evaluation  PE/DVT (not on thinners), cerebral aneurysm, fibromyalgia, chronic pain, htn, and  migraines   Additional history obtained:  Additional history obtained from epic chart review  Lab Tests:  I Ordered, and personally interpreted labs.  The pertinent results include:  cbc nl, bmp nl   Imaging Studies ordered:  I ordered imaging studies including cxr  I independently visualized and interpreted imaging which showed No active cardiopulmonary disease. Low lung volumes.  I agree with the radiologist interpretation   Cardiac Monitoring:  The patient was maintained on a  cardiac monitor.  I personally viewed and interpreted the cardiac monitored which showed an underlying rhythm of: nsr   Medicines ordered and prescription drug management:  I ordered medication including morphine/zofran  for sx  Reevaluation of the patient after these medicines showed that the patient improved I have reviewed the patients home medicines and have made adjustments as needed   Test Considered:  ct   Critical Interventions:  Pain control   Problem List / ED Course:  Chest wall contusion:  no fx.  Pt is stable for d/c.  Return if worse.  F/u with pcp.   Reevaluation:  After the interventions noted above, I reevaluated the patient and found that they have :improved   Social Determinants of Health:  Lives at home   Dispostion:  After consideration of the diagnostic results and the patients response to treatment, I feel that the patent would benefit from discharge with outpatient f/u.          Final Clinical Impression(s) / ED Diagnoses Final diagnoses:  Fall, initial encounter  Contusion of right chest wall, initial encounter    Rx / DC Orders ED Discharge Orders          Ordered    oxyCODONE-acetaminophen (PERCOCET/ROXICET) 5-325 MG tablet  Every 6 hours PRN        10/28/22 2127    ondansetron (ZOFRAN-ODT) 4 MG disintegrating tablet  Every 8 hours PRN        10/28/22 2127              Jacalyn Lefevre, MD 10/28/22 2129

## 2022-10-28 NOTE — ED Triage Notes (Signed)
Pt arrives POV from home after a mechanical fall today in the bathroom. Pt complains of right sided pain due to falling into the side of the tub. Pt denies LOC/dizziness/headache.

## 2022-10-28 NOTE — ED Provider Triage Note (Signed)
Emergency Medicine Provider Triage Evaluation Note  Maria [name] , a 57 y.o. female  was evaluated in triage.  Pt complains of right sided rib pain after falling about 1 hour ago. She reports that she was in the bathroom and fell between the toilet and bathtub, she reached for the bathtub for support when she was falling and hit her right breast and ribs on the side of the tub. She denies LOC.  Review of Systems  Positive: Right sided rib pain, right breast tenderness Negative: Headache  Physical Exam  BP (!) 162/99 (BP Location: Right Arm)   Pulse 73   Temp 99 F (37.2 C)   Resp 20   Ht 5\' 3"  (1.6 m)   Wt 68 kg   SpO2 96%   BMI 26.57 kg/m  Gen:   Awake, no distress   Resp:  Normal effort  MSK:   Tenderness to right ribs and lateral aspect of right breast with palpation. Moves extremities without difficulty  Other:    Medical Decision Making  Medically screening exam initiated at 6:18 PM.  Appropriate orders placed.  Rifky Kastor was informed that the remainder of the evaluation will be completed by another provider, this initial triage assessment does not replace that evaluation, and the importance of remaining in the ED until their evaluation is complete.    Maxwell Marion, PA-C 10/28/22 1826

## 2023-03-17 ENCOUNTER — Telehealth: Payer: Self-pay | Admitting: Student

## 2023-03-17 NOTE — Telephone Encounter (Signed)
Interventional Radiology Brief Note:  Called patient at the request of Dr. Tommie Sams to clarify medication regimen as it appears patient has been prescribed DAPT as well as anticoagulation.    Patient reports that she has had difficulty affording all of her medications and as a result has stopped taking most (or all) of them.  She has no concerns, complaints, or neuro symptoms. She has not taken Eliquis (reports never taking this) and has not taken Brilinta for >4 months.  She states she does periodically take aspirin 81mg .   Reviewed case with Dr. Tommie Sams who recommends patient take aspirin 81mg  DAILY and get promptly scheduled for [facility] to assess aneurysm status.  Patient aware and agreeable.  Will have schedulers arrange imaging.   Loyce Dys, MS RD PA-C

## 2023-03-21 ENCOUNTER — Emergency Department (HOSPITAL_COMMUNITY): Payer: BLUE CROSS/BLUE SHIELD

## 2023-03-21 ENCOUNTER — Emergency Department (HOSPITAL_COMMUNITY)
Admission: EM | Admit: 2023-03-21 | Discharge: 2023-03-21 | Disposition: A | Discharge status: Home / Self Care | Payer: BLUE CROSS/BLUE SHIELD | Attending: Emergency Medicine | Admitting: Emergency Medicine

## 2023-03-21 ENCOUNTER — Other Ambulatory Visit: Payer: Self-pay

## 2023-03-21 ENCOUNTER — Encounter (HOSPITAL_COMMUNITY): Payer: Self-pay

## 2023-03-21 DIAGNOSIS — Z1152 Encounter for screening for COVID-19: Secondary | ICD-10-CM | POA: Insufficient documentation

## 2023-03-21 DIAGNOSIS — R062 Wheezing: Secondary | ICD-10-CM | POA: Insufficient documentation

## 2023-03-21 DIAGNOSIS — R0789 Other chest pain: Secondary | ICD-10-CM | POA: Diagnosis not present

## 2023-03-21 DIAGNOSIS — R0600 Dyspnea, unspecified: Secondary | ICD-10-CM | POA: Diagnosis present

## 2023-03-21 LAB — BASIC METABOLIC PANEL
Anion gap: 7 (ref 5–15)
BUN: 16 mg/dL (ref 6–20)
CO2: 26 mmol/L (ref 22–32)
Calcium: 9.1 mg/dL (ref 8.9–10.3)
Chloride: 103 mmol/L (ref 98–111)
Creatinine, Ser: 0.63 mg/dL (ref 0.44–1.00)
GFR, Estimated: >60 mL/min (ref >60)
Glucose, Bld: 102 mg/dL — ABNORMAL HIGH (ref 70–99)
Potassium: 3.5 mmol/L (ref 3.5–5.1)
Sodium: 136 mmol/L (ref 135–145)

## 2023-03-21 LAB — TROPONIN I (HIGH SENSITIVITY)
Troponin I (High Sensitivity): 3 ng/L (ref <18)
Troponin I (High Sensitivity): 5 ng/L (ref <18)

## 2023-03-21 LAB — CBC
HCT: 38.6 % (ref 36.0–46.0)
Hemoglobin: 12.6 g/dL (ref 12.0–15.0)
MCH: 28.1 pg (ref 26.0–34.0)
MCHC: 32.6 g/dL (ref 30.0–36.0)
MCV: 86.2 fL (ref 80.0–100.0)
Platelets: 325 10*3/uL (ref 150–400)
RBC: 4.48 MIL/uL (ref 3.87–5.11)
RDW: 13.9 % (ref 11.5–15.5)
WBC: 7.7 10*3/uL (ref 4.0–10.5)
nRBC: 0 % (ref 0.0–0.2)

## 2023-03-21 LAB — HCG, SERUM, QUALITATIVE: Preg, Serum: NEGATIVE

## 2023-03-21 LAB — RESP PANEL BY RT-PCR (RSV, FLU A&B, COVID)  RVPGX2
Influenza A by PCR: NEGATIVE
Influenza B by PCR: NEGATIVE
Resp Syncytial Virus by PCR: NEGATIVE
SARS Coronavirus 2 by RT PCR: NEGATIVE

## 2023-03-21 LAB — I-STAT CHEM 8, ED
BUN: 17 mg/dL (ref 6–20)
Calcium, Ion: 1.16 mmol/L (ref 1.15–1.40)
Chloride: 105 mmol/L (ref 98–111)
Creatinine, Ser: 0.6 mg/dL (ref 0.44–1.00)
Glucose, Bld: 98 mg/dL (ref 70–99)
HCT: 38 % (ref 36.0–46.0)
Hemoglobin: 12.9 g/dL (ref 12.0–15.0)
Potassium: 3.6 mmol/L (ref 3.5–5.1)
Sodium: 139 mmol/L (ref 135–145)
TCO2: 24 mmol/L (ref 22–32)

## 2023-03-21 LAB — BRAIN NATRIURETIC PEPTIDE: B Natriuretic Peptide: 37.7 pg/mL (ref 0.0–100.0)

## 2023-03-21 MED ORDER — FENTANYL CITRATE PF 50 MCG/ML IJ SOSY
25.0000 ug | PREFILLED_SYRINGE | Freq: Once | INTRAMUSCULAR | Status: AC
  Administered 2023-03-21: 25 ug via INTRAVENOUS
  Filled 2023-03-21: qty 1

## 2023-03-21 MED ORDER — AEROCHAMBER PLUS FLO-VU MEDIUM MISC
1.0000 | Freq: Once | Status: AC
  Administered 2023-03-21: 1
  Filled 2023-03-21 (×3): qty 1

## 2023-03-21 MED ORDER — ALBUTEROL SULFATE HFA 108 (90 BASE) MCG/ACT IN AERS
2.0000 | INHALATION_SPRAY | Freq: Once | RESPIRATORY_TRACT | Status: AC
  Administered 2023-03-21: 2 via RESPIRATORY_TRACT
  Filled 2023-03-21: qty 6.7

## 2023-03-21 MED ORDER — IOHEXOL 350 MG/ML SOLN
75.0000 mL | Freq: Once | INTRAVENOUS | Status: AC | PRN
  Administered 2023-03-21: 75 mL via INTRAVENOUS

## 2023-03-21 NOTE — ED Triage Notes (Signed)
Patient reports left sided chest pain under left breast that started last night.  Reports she is suppose to be on several meds including a blood thinner but can't afford her meds.  Patient appears congested.  Hx of brain surgery a couple months ago.  ASA 324ng given by EMS [facility]

## 2023-03-21 NOTE — Discharge Instructions (Addendum)
Please take tylenol over the counter up to 1000mg  three times a day for chest pain.   Please use albuterol use every 4-6 hours AS NEEDED for wheezing.   Sincerely,  Manuela Neptune, MD

## 2023-03-21 NOTE — ED Notes (Signed)
Called pharmacy [facility], they advised they would send one.

## 2023-03-21 NOTE — ED Notes (Signed)
Patient transported to X-ray 

## 2023-03-21 NOTE — Discharge Planning (Signed)
RNCM consulted in regards to medication assistance.  Pt has Medicaid insurance coverage and is not eligible for Medication Assistance Through [facility]) program. RNCM suggests sending Rx to  [facility] to fill and bring to pt at bedside prior to discharge. No further CM needs communicated at this time.

## 2023-03-21 NOTE — ED Provider Notes (Cosign Needed Addendum)
[place] EMERGENCY DEPARTMENT AT [facility] Provider Note   CSN: [id number] Arrival date & time: 03/21/23  6644     History Chief Complaint  Patient presents with   Chest Pain    HPI Maria [name] is a 57 y.o. female presenting for dyspnea and chest pain that started yesterday suddenly while she was sitting. It is under the left beast, worsens with breathing and movement. She states that she had a similar pain before and it went away on its own. She has a cough, no N/V. Has been smoking since she was 17. Denies any alcohol use or drug use. No abdominal pain, no difficulty or pain with urination. She reports that her right leg was swollen and then the SOB started.   Has history of multiple PE. Is supposed to be on anticoagulation but has not been able to afford her medications. She is not on any anticoagulation today.   Patient's recorded medical, surgical, social, medication list and allergies were reviewed in the Snapshot window as part of the initial history.   Review of Systems   As stated in the HPI  Physical Exam Updated Vital Signs BP 117/87   Pulse 62   Temp 98.1 F (36.7 C) (Oral)   Resp 16   Ht 5\' 3"  (1.6 m)   Wt 68 kg   SpO2 100%   BMI 26.57 kg/m  Physical Exam Constitutional:      General: She is in acute distress.     Appearance: She is ill-appearing and diaphoretic. She is not toxic-appearing.  Cardiovascular:     Rate and Rhythm: Normal rate and regular rhythm.     Heart sounds: Heart sounds not distant. No murmur heard.    No friction rub. No gallop.  Pulmonary:     Effort: Tachypnea, accessory muscle usage and respiratory distress present.     Breath sounds: Examination of the right-upper field reveals wheezing and rhonchi. Examination of the left-upper field reveals wheezing and rhonchi. Examination of the right-middle field reveals wheezing and rhonchi. Examination of the left-middle field reveals wheezing and rhonchi. Examination of the  right-lower field reveals wheezing and rhonchi. Examination of the left-lower field reveals wheezing and rhonchi. Wheezing and rhonchi present. No decreased breath sounds or rales.  Chest:     Chest wall: Tenderness present.  Abdominal:     Palpations: Abdomen is soft. There is no mass.     Tenderness: There is no abdominal tenderness. There is no guarding.  Musculoskeletal:     Right lower leg: No tenderness. No edema.     Left lower leg: No tenderness. No edema.  Skin:    General: Skin is warm.  Neurological:     Mental Status: She is alert.    ED Course/ Medical Decision Making/ A&P   Medications Ordered in ED Medications  albuterol (VENTOLIN HFA) 108 (90 Base) MCG/ACT inhaler 2 puff (has no administration in time range)  AeroChamber Plus Flo-Vu Medium MISC 1 each (has no administration in time range)  iohexol (OMNIPAQUE) 350 MG/ML injection 75 mL (75 mLs Intravenous Contrast Given 03/21/23 1150)  fentaNYL (SUBLIMAZE) injection 25 mcg (25 mcg Intravenous Given 03/21/23 1243)   Medical Decision Making:    Maria [name] is a 57 y.o. female who presented to the ED today with sudden SOB that started yesterday as detailed above.     Patient's presentation is complicated by their history of pulmonary embolism.   Complete initial physical exam performed, notably the patient  was dyspneic, with tenderness to palpation over left inframammary region. The pain is pleuritic in nature. I am concerned of a PE given that she has a history of these an is not on any anticoagulation.     Reviewed and confirmed nursing documentation for past medical history, family history, social history.    Initial Assessment:   With the patient's presentation, most likely diagnosis is a PE. Other diagnoses were considered including (but not limited to) PNA, Atelectasis, URI, Musculoskeletal pain.  Initial Plan:  Screening labs including CBC and Metabolic panel to evaluate for infectious or metabolic etiology  of disease.  CXR to evaluate for structural/infectious intrathoracic pathology.  EKG to evaluate for cardiac pathology. Troponins for chest pain BNP for fluid volume assessment  CT PE since she is high risk for a clot Objective evaluation as below reviewed with plan for close reassessment  Initial Study Results:   Laboratory  All laboratory results reviewed without evidence of clinically relevant pathology.    EKG EKG was reviewed independently. Rate, rhythm, axis, intervals all examined and without medically relevant abnormality. ST segments without concerns for elevations.    Radiology   Agree with radiology report at this time.   CT Angio Chest PE W and/or Wo Contrast  Result Date: 03/21/2023 CLINICAL DATA:  Pulmonary embolism (PE) suspected, high prob. Chest pain. EXAM: CT ANGIOGRAPHY CHEST WITH CONTRAST TECHNIQUE: Multidetector CT imaging of the chest was performed using the standard protocol during bolus administration of intravenous contrast. Multiplanar CT image reconstructions and MIPs were obtained to evaluate the vascular anatomy. RADIATION DOSE REDUCTION: This exam was performed according to the departmental dose-optimization program which includes automated exposure control, adjustment of the mA and/or kV according to patient size and/or use of iterative reconstruction technique. CONTRAST:  75mL OMNIPAQUE IOHEXOL 350 MG/ML SOLN COMPARISON:  CTA chest dated Nov 04, 2020. FINDINGS: Cardiovascular: Satisfactory opacification of the pulmonary arteries to the segmental level. No evidence of pulmonary embolism. Pulmonary trunk is dilated measuring 3.6 cm. Normal heart size. No pericardial effusion. Atherosclerotic calcifications of the visualized thoracoabdominal aorta. Mediastinum/Nodes: No enlarged mediastinal, hilar, or axillary lymph nodes. Thyroid gland, trachea, and esophagus demonstrate no significant findings. Lungs/Pleura: No focal consolidation. Mild bibasilar atelectasis. No  pleural effusion or pneumothorax. Upper Abdomen: No acute abnormality. Musculoskeletal: Review of the MIP images confirms the above findings. IMPRESSION: 1. No evidence of pulmonary embolism. 2. Enlargement of the central pulmonary arteries, which can be seen in the setting of pulmonary arterial hypertension. 3.  Aortic Atherosclerosis (ICD10-I70.0). Electronically Signed   By: Hart Robinsons M.D.   On: 03/21/2023 13:28   DG Chest 2 View  Result Date: 03/21/2023 CLINICAL DATA:  Chest pain EXAM: CHEST - 2 VIEW COMPARISON:  X-ray 10/28/2022 FINDINGS: Left-sided basilar atelectasis or scar. No consolidation, pneumothorax or effusion. No edema. Normal cardiopericardial silhouette. Degenerative changes along the spine. IMPRESSION: Mild left basilar scar or atelectasis. Electronically Signed   By: Karen Kays M.D.   On: 03/21/2023 11:22     Reassessment and Plan:    Maria [name] is a 57 y.o. with a PMH of PE non compliant with her anticoagulation presenting with acute onset pain on the left chest, associated with dyspnea and pleurisy. CXR negative for PNA, Labs are unremarkable, and no PE on CTA.Marland Kitchen COVID RSV Flu is negative. Per patient the pain started before the dyspnea, this could have been triggered by the pain. Given unremarkable work up, this is most likely musculoskeletal.  She is wheezing and is  a smoker. No formal diagnosis of COPD. Will give albuterol here and patient is to continue using inhaler PRN for wheezing. Pain medication to be sent to pharmacy. Return to the ED should pain persist, develops a fever, worsening dyspnea or cough.    Clinical Impression:  1. Chest pain, musculoskeletal     Final Clinical Impression(s) / ED Diagnoses Final diagnoses:  Chest pain, musculoskeletal    Rx / DC Orders ED Discharge Orders     None         Hassan Rowan, [place], MD 03/21/23 1410    [place], [place], MD 03/21/23 1411    Tegeler, Canary Brim, MD 03/23/23  1023

## 2023-03-23 ENCOUNTER — Other Ambulatory Visit (HOSPITAL_COMMUNITY): Payer: Self-pay | Admitting: Neuroradiology

## 2023-03-23 DIAGNOSIS — I671 Cerebral aneurysm, nonruptured: Secondary | ICD-10-CM

## 2023-04-03 ENCOUNTER — Ambulatory Visit (HOSPITAL_COMMUNITY): Payer: Medicaid Other | Attending: Neuroradiology

## 2023-04-03 ENCOUNTER — Encounter (HOSPITAL_COMMUNITY): Payer: Self-pay

## 2023-04-13 ENCOUNTER — Telehealth (HOSPITAL_COMMUNITY): Payer: Self-pay

## 2023-04-13 NOTE — Telephone Encounter (Signed)
Called to reschedule mri, no answer, left vm. AB

## 2024-02-13 ENCOUNTER — Other Ambulatory Visit: Payer: Self-pay | Admitting: Internal Medicine

## 2024-02-13 DIAGNOSIS — Z1231 Encounter for screening mammogram for malignant neoplasm of breast: Secondary | ICD-10-CM

## 2024-02-20 ENCOUNTER — Encounter: Payer: Self-pay | Admitting: Physician Assistant

## 2024-02-21 ENCOUNTER — Ambulatory Visit
Admission: RE | Admit: 2024-02-21 | Discharge: 2024-02-21 | Disposition: A | Discharge status: Home / Self Care | Source: Ambulatory Visit | Attending: Internal Medicine | Admitting: Internal Medicine

## 2024-02-21 DIAGNOSIS — Z1231 Encounter for screening mammogram for malignant neoplasm of breast: Secondary | ICD-10-CM

## 2024-04-09 NOTE — Progress Notes (Deleted)
 [name], PA-C [address] [place], [place]  [address] Phone: [phone]   Gastroenterology Consultation  Referring Provider:     Shelda Atlas, MD Primary Care Physician:  Maria Atlas, MD Primary Gastroenterologist:  [name], PA-C / *** Reason for Consultation:     New patient.  Discuss colonoscopy.        HPI:   Discussed the use of AI scribe software for clinical note transcription with the patient, who gave verbal consent to proceed.  History of Present Illness   Previous colonoscopy?  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  Currently on 81 mg aspirin  daily.  No other blood thinners.  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   Chronic back pain    Depression    DVT (deep venous thrombosis) (HCC)    Fibromyalgia    Head injury 02/2021   Hypertension    Iron deficiency anemia    Migraines    PE (pulmonary embolism)     Past Surgical History:  Procedure Laterality Date   CESAREAN SECTION     COLONOSCOPY W/ POLYPECTOMY     ENDOMETRIAL ABLATION     HERNIA REPAIR     IR ANGIO INTRA EXTRACRAN SEL INTERNAL CAROTID BILAT MOD SED  06/16/2021   IR ANGIO VERTEBRAL SEL VERTEBRAL UNI R MOD SED  06/16/2021   IR CT HEAD LTD  06/16/2021   IR INTRA CRAN STENT  06/16/2021   IR TRANSCATH/EMBOLIZ  06/16/2021   IR US  GUIDE VASC ACCESS RIGHT  06/16/2021   KNEE ARTHROSCOPY WITH DRILLING/MICROFRACTURE Left 12/07/2018   Procedure: KNEE ARTHROSCOPY WITH MICROFRACTURE with chondroplasty;  Surgeon: Shari Sieving, MD;  Location: [facility];  Service: Orthopedics;  Laterality: Left;   KNEE ARTHROSCOPY WITH MEDIAL MENISECTOMY Left 12/07/2018   Procedure: KNEE ARTHROSCOPY LATERAL  MENISECTOMY;  Surgeon: Shari Sieving, MD;  Location: [facility];  Service: Orthopedics;  Laterality: Left;   RADIOLOGY WITH ANESTHESIA N/A 06/16/2021   Procedure: ANEURYSM  EMBOLIZATION;  Surgeon: de Macedo Rodrigues, [place], MD;  Location: [facility] OR;  Service: Radiology;  Laterality: N/A;   TOTAL KNEE ARTHROPLASTY Left 10/23/2020   Procedure: TOTAL KNEE ARTHROPLASTY;  Surgeon: Shari Sieving, MD;  Location: [facility];  Service: Orthopedics;  Laterality: Left;    Prior to Admission medications   Medication Sig Start Date End Date Taking? Authorizing Provider  alprazolam  (XANAX ) 2 MG tablet Take 2 mg by mouth daily. 12/17/20   [provider]  amLODipine (NORVASC) 5 MG tablet Take 5 mg by mouth daily. 01/04/23   [provider]  aspirin  EC 81 MG tablet Take 1 tablet (81 mg total) by mouth daily. Swallow whole. 06/17/21   Han, Aimee H, PA-C  benzonatate (TESSALON) 100 MG capsule Take 100-200 mg by mouth every 8 (eight) hours as needed for cough. 05/24/21   [provider]  escitalopram  (LEXAPRO ) 10 MG tablet Take 10 mg by mouth every evening. 08/28/20   [provider]  hydrOXYzine  (ATARAX /VISTARIL ) 25 MG tablet Take 25 mg by mouth in the morning and at bedtime. 08/28/20   [provider]  nabumetone  (RELAFEN ) 750 MG tablet Take 750 mg by mouth 2 (two)  times daily. 03/10/23   [provider]  oxyCODONE -acetaminophen  (PERCOCET/ROXICET) 5-325 MG tablet Take 1 tablet by mouth every 6 (six) hours as needed for severe pain. 10/28/22   Dean Clarity, MD  pregabalin  (LYRICA ) 150 MG capsule Take 150 mg by mouth 3 (three) times daily. 12/18/20   [provider]  promethazine -dextromethorphan  (PROMETHAZINE -DM) 6.25-15 MG/5ML syrup Take 10 mLs by mouth 2 (two) times daily as needed for cough. 03/07/23   [provider]  tiZANidine  (ZANAFLEX ) 4 MG tablet Take 4 mg by mouth 3 (three) times daily. 08/28/20   [provider]   Vitamin D , Ergocalciferol , (DRISDOL) 1.25 MG (50000 UNIT) CAPS capsule Take 50,000 Units by mouth every Monday. 08/02/19   [provider]    Family History  Problem Relation Age of Onset   Cancer Mother    Cancer Father    Diabetes Sister    Hypertension Sister      Social History   Tobacco Use   Smoking status: Former    Current packs/day: 0.00    Average packs/day: 0.2 packs/day for 36.0 years (5.4 ttl pk-yrs)    Types: Cigarettes    Start date: 02/21/1985    Quit date: 02/21/2021    Years since quitting: 3.1   Smokeless tobacco: Never   Tobacco comments:    1 pack/week  Vaping Use   Vaping status: Never Used  Substance Use Topics   Alcohol use: Not Currently    Comment: occas.   Drug use: No    Allergies as of 04/10/2024 - Review Complete 03/21/2023  Allergen Reaction Noted   Hydrocodone  Nausea And Vomiting 08/12/2019   Acyclovir and related  06/15/2021   Other  10/15/2020    Review of Systems:    All systems reviewed and negative except where noted in HPI.   Physical Exam:  There were no vitals taken for this visit. No LMP recorded. Patient has had an ablation.  General:   Alert,  Well-developed, well-nourished, pleasant and cooperative in NAD Lungs:  Respirations even and unlabored.  Clear throughout to auscultation.   No wheezes, crackles, or rhonchi. No acute distress. Heart:  Regular rate and rhythm; no murmurs, clicks, rubs, or gallops. Abdomen:  Normal bowel sounds.  No bruits.  Soft, and non-distended without masses, hepatosplenomegaly or hernias noted.  No Tenderness.  No guarding or rebound tenderness.    Neurologic:  Alert and oriented x3;  grossly normal neurologically. Psych:  Alert and cooperative. Normal mood and affect.   Imaging Studies: No results found.  Labs: CBC    Component Value Date/Time   WBC 7.7 03/21/2023 1010   RBC 4.48 03/21/2023 1010   HGB 12.9 03/21/2023 1041   HGB 12.9 11/08/2013 0910   HCT 38.0 03/21/2023 1041    HCT 39.7 11/08/2013 0910   PLT 325 03/21/2023 1010   PLT 300 11/08/2013 0910   MCV 86.2 03/21/2023 1010   MCV 86.6 11/08/2013 0910    CMP     Component Value Date/Time   NA 139 03/21/2023 1041   NA 141 11/08/2013 0910   K 3.6 03/21/2023 1041   K 3.8 11/08/2013 0910   CL 105 03/21/2023 1041   CL 108 (H) 11/08/2012 1531   CO2 26 03/21/2023 1010   CO2 25 11/08/2013 0910   GLUCOSE 98 03/21/2023 1041   GLUCOSE 115 11/08/2013 0910   GLUCOSE 79 11/08/2012 1531   BUN 17 03/21/2023 1041   BUN 8.8 11/08/2013 0910   CREATININE 0.60 03/21/2023 1041  CREATININE 0.60 10/20/2021 1105   CREATININE 0.8 11/08/2013 0910   CALCIUM 9.1 03/21/2023 1010   CALCIUM 9.3 11/08/2013 0910   PROT 7.3 10/20/2021 1105   PROT 7.5 11/08/2013 0910   ALBUMIN 3.4 (L) 02/21/2021 1808   ALBUMIN 4.0 11/08/2013 0910   AST 14 10/20/2021 1105   AST 11 11/08/2013 0910   ALT 10 10/20/2021 1105   ALT 9 11/08/2013 0910   ALKPHOS 83 02/21/2021 1808   ALKPHOS 60 11/08/2013 0910   BILITOT 0.4 10/20/2021 1105   BILITOT 0.79 11/08/2013 0910   GFRNONAA >60 03/21/2023 1010   GFRAA >60 08/12/2019 1201    Assessment and Plan:   Maria [name] is a 58 y.o. y/o female has been referred for ***  Assessment and Plan Assessment & Plan       Follow up ***  [name], PA-C

## 2024-04-10 ENCOUNTER — Ambulatory Visit: Admitting: Physician Assistant
